# Patient Record
Sex: Female | Born: 2000 | Race: Black or African American | Hispanic: Yes | Marital: Single | Smoking: Never smoker
Health system: Southern US, Community
[De-identification: ages and names within clinical notes are randomized; demographics above are authoritative.]

## PROBLEM LIST (undated history)

## (undated) ENCOUNTER — Inpatient Hospital Stay (HOSPITAL_COMMUNITY): Payer: Self-pay

## (undated) ENCOUNTER — Emergency Department (HOSPITAL_COMMUNITY): Admission: EM | Payer: Medicaid Other | Source: Home / Self Care

## (undated) ENCOUNTER — Emergency Department (HOSPITAL_COMMUNITY)

## (undated) DIAGNOSIS — J45909 Unspecified asthma, uncomplicated: Secondary | ICD-10-CM

## (undated) DIAGNOSIS — N83209 Unspecified ovarian cyst, unspecified side: Secondary | ICD-10-CM

## (undated) DIAGNOSIS — A6000 Herpesviral infection of urogenital system, unspecified: Secondary | ICD-10-CM

## (undated) DIAGNOSIS — F419 Anxiety disorder, unspecified: Secondary | ICD-10-CM

## (undated) DIAGNOSIS — A749 Chlamydial infection, unspecified: Secondary | ICD-10-CM

## (undated) DIAGNOSIS — N730 Acute parametritis and pelvic cellulitis: Secondary | ICD-10-CM

## (undated) DIAGNOSIS — N390 Urinary tract infection, site not specified: Secondary | ICD-10-CM

## (undated) DIAGNOSIS — A549 Gonococcal infection, unspecified: Secondary | ICD-10-CM

## (undated) DIAGNOSIS — D649 Anemia, unspecified: Secondary | ICD-10-CM

## (undated) DIAGNOSIS — F32A Depression, unspecified: Secondary | ICD-10-CM

## (undated) HISTORY — PX: NO PAST SURGERIES: SHX2092

---
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
## 2018-04-25 ENCOUNTER — Encounter (HOSPITAL_COMMUNITY): Payer: Self-pay

## 2018-09-29 HISTORY — PX: OVARIAN CYST REMOVAL: SHX89

## 2018-11-04 ENCOUNTER — Encounter (HOSPITAL_COMMUNITY): Payer: Self-pay

## 2018-11-04 ENCOUNTER — Emergency Department (HOSPITAL_COMMUNITY)
Admission: EM | Admit: 2018-11-04 | Discharge: 2018-11-05 | Disposition: A | Discharge status: Home / Self Care | Payer: Medicaid Other | Attending: Emergency Medicine | Admitting: Emergency Medicine

## 2018-11-04 ENCOUNTER — Other Ambulatory Visit: Payer: Self-pay

## 2018-11-04 DIAGNOSIS — B349 Viral infection, unspecified: Secondary | ICD-10-CM

## 2018-11-04 DIAGNOSIS — Z87891 Personal history of nicotine dependence: Secondary | ICD-10-CM | POA: Insufficient documentation

## 2018-11-04 DIAGNOSIS — J45909 Unspecified asthma, uncomplicated: Secondary | ICD-10-CM | POA: Insufficient documentation

## 2018-11-04 LAB — COMPREHENSIVE METABOLIC PANEL
ALK PHOS: 53 U/L (ref 38–126)
ALT: 13 U/L (ref 0–44)
ANION GAP: 8 (ref 5–15)
AST: 21 U/L (ref 15–41)
Albumin: 4 g/dL (ref 3.5–5.0)
BILIRUBIN TOTAL: 0.7 mg/dL (ref 0.3–1.2)
BUN: 8 mg/dL (ref 6–20)
CALCIUM: 9.5 mg/dL (ref 8.9–10.3)
CO2: 21 mmol/L — ABNORMAL LOW (ref 22–32)
Chloride: 104 mmol/L (ref 98–111)
Creatinine, Ser: 0.82 mg/dL (ref 0.44–1.00)
GLUCOSE: 102 mg/dL — AB (ref 70–99)
Potassium: 4 mmol/L (ref 3.5–5.1)
Sodium: 133 mmol/L — ABNORMAL LOW (ref 135–145)
Total Protein: 8.3 g/dL — ABNORMAL HIGH (ref 6.5–8.1)

## 2018-11-04 LAB — CBC
HCT: 36.1 % (ref 36.0–46.0)
Hemoglobin: 10.1 g/dL — ABNORMAL LOW (ref 12.0–15.0)
MCH: 20 pg — AB (ref 26.0–34.0)
MCHC: 28 g/dL — ABNORMAL LOW (ref 30.0–36.0)
MCV: 71.6 fL — AB (ref 80.0–100.0)
NRBC: 0 % (ref 0.0–0.2)
PLATELETS: 179 10*3/uL (ref 150–400)
RBC: 5.04 MIL/uL (ref 3.87–5.11)
RDW: 16.8 % — ABNORMAL HIGH (ref 11.5–15.5)
WBC: 8 10*3/uL (ref 4.0–10.5)

## 2018-11-04 LAB — I-STAT BETA HCG BLOOD, ED (MC, WL, AP ONLY): I-stat hCG, quantitative: <5 m[IU]/mL (ref <5)

## 2018-11-04 LAB — LIPASE, BLOOD: Lipase: 46 U/L (ref 11–51)

## 2018-11-04 MED ORDER — ACETAMINOPHEN 325 MG PO TABS
650.0000 mg | ORAL_TABLET | Freq: Once | ORAL | Status: AC | PRN
  Administered 2018-11-04: 650 mg via ORAL
  Filled 2018-11-04: qty 2

## 2018-11-04 MED ORDER — SODIUM CHLORIDE 0.9% FLUSH
3.0000 mL | Freq: Once | INTRAVENOUS | Status: AC
  Administered 2018-11-05: 3 mL via INTRAVENOUS

## 2018-11-04 NOTE — ED Notes (Signed)
Bed: WA06 Expected date:  Expected time:  Means of arrival:  Comments: 

## 2018-11-04 NOTE — ED Triage Notes (Signed)
Pt reports a headache with photosensitivity that starting this morning. She is also reporting lower centralized abdominal pain x 2 days accompanied with diarrhea. Denies vomiting. EMS reports that pt had a panic attack with them, but pt is calm in triage. A&Ox4.

## 2018-11-05 LAB — URINALYSIS, ROUTINE W REFLEX MICROSCOPIC
BILIRUBIN URINE: NEGATIVE
Glucose, UA: NEGATIVE mg/dL
Ketones, ur: NEGATIVE mg/dL
NITRITE: NEGATIVE
PROTEIN: NEGATIVE mg/dL
Specific Gravity, Urine: 1.002 — ABNORMAL LOW (ref 1.005–1.030)
pH: 6 (ref 5.0–8.0)

## 2018-11-05 LAB — INFLUENZA PANEL BY PCR (TYPE A & B)
INFLAPCR: NEGATIVE
Influenza B By PCR: NEGATIVE

## 2018-11-05 MED ORDER — KETOROLAC TROMETHAMINE 15 MG/ML IJ SOLN
15.0000 mg | Freq: Once | INTRAMUSCULAR | Status: AC
  Administered 2018-11-05: 15 mg via INTRAVENOUS
  Filled 2018-11-05: qty 1

## 2018-11-05 MED ORDER — SODIUM CHLORIDE 0.9 % IV BOLUS
1000.0000 mL | Freq: Once | INTRAVENOUS | Status: AC
  Administered 2018-11-05: 1000 mL via INTRAVENOUS

## 2018-11-05 NOTE — ED Notes (Signed)
Patient is complaining of pain in mid upper abdomen. Patient states she feels nauseas when she stands up from the abdominal pain.

## 2018-11-05 NOTE — Discharge Instructions (Addendum)
You were evaluated in the Emergency Department and after careful evaluation, we did not find any emergent condition requiring admission or further testing in the hospital.  Your symptoms today seem to be due to a viral illness.  Please drink plenty of fluids at home and use Tylenol or ibuprofen for discomfort.  Please return to the Emergency Department if you experience any worsening of your condition, especially worsening abdominal pain.  We encourage you to follow up with a primary care provider.  Thank you for allowing [place] to be a part of your care.

## 2018-11-05 NOTE — ED Notes (Signed)
Patient aware of urine sample, will collect the next one.

## 2018-11-05 NOTE — ED Notes (Signed)
Ambulatory to the bathroom without assistance.

## 2018-11-05 NOTE — ED Provider Notes (Signed)
[facility] Emergency Department Provider Note MRN:  [id number]  Arrival date & time: 11/05/18     Chief Complaint   Headache and Abdominal Pain   History of Present Illness   Laura [name] is a 18 y.o. year-old female with a history of asthma presenting to the ED with chief complaint of abdominal pain.  2-1/2 days of feeling sick, fever, dull frontal headache, sore throat, general malaise, abdominal pain.  Pain is located in the periumbilical region, nonradiating, dull, constant.  Denies neck pain, no chest pain or shortness of breath, no vaginal bleeding or discharge.  No exacerbating relieving factors.  Patient's neighbor comes to her house a lot and was recently diagnosed with the flu.  Review of Systems  A complete 10 system review of systems was obtained and all systems are negative except as noted in the HPI and PMH.   Patient's Health History    Past Medical History:  Diagnosis Date  . Anemia   . Asthma   . Genital herpes     Past Surgical History:  Procedure Laterality Date  . NO PAST SURGERIES      History reviewed. No pertinent family history.  Social History   Socioeconomic History  . Marital status: Single    Spouse name: Not on file  . Number of children: Not on file  . Years of education: Not on file  . Highest education level: Not on file  Occupational History  . Not on file  Social Needs  . Financial resource strain: Not on file  . Food insecurity:    Worry: Not on file    Inability: Not on file  . Transportation needs:    Medical: Not on file    Non-medical: Not on file  Tobacco Use  . Smoking status: Former Research scientist (life sciences)  . Smokeless tobacco: Never Used  Substance and Sexual Activity  . Alcohol use: No    Frequency: Never  . Drug use: No  . Sexual activity: Not on file  Lifestyle  . Physical activity:    Days per week: Not on file    Minutes per session: Not on file  . Stress: Not on file  Relationships  . Social  connections:    Talks on phone: Not on file    Gets together: Not on file    Attends religious service: Not on file    Active member of club or organization: Not on file    Attends meetings of clubs or organizations: Not on file    Relationship status: Not on file  . Intimate partner violence:    Fear of current or ex partner: Not on file    Emotionally abused: Not on file    Physically abused: Not on file    Forced sexual activity: Not on file  Other Topics Concern  . Not on file  Social History Narrative  . Not on file     Physical Exam  Vital Signs and Nursing Notes reviewed Vitals:   11/05/18 0007 11/05/18 0109  BP: 111/74 115/73  Pulse: (!) 103 (!) 101  Resp: 17 (!) 21  Temp: 100.1 F (37.8 C)   SpO2: 100% 100%    CONSTITUTIONAL: Well-appearing, NAD NEURO:  Alert and oriented x 3, no focal deficits EYES:  eyes equal and reactive ENT/NECK:  no LAD, no JVD CARDIO: Regular rate, well-perfused, normal S1 and S2 PULM:  CTAB no wheezing or rhonchi GI/GU:  normal bowel sounds, non-distended, mild periumbilical tenderness to palpation  MSK/SPINE:  No gross deformities, no edema SKIN:  no rash, atraumatic PSYCH:  Appropriate speech and behavior  Diagnostic and Interventional Summary    EKG Interpretation  Date/Time:  Friday November 05 2018 00:14:10 EST Ventricular Rate:  102 PR Interval:    QRS Duration: 82 QT Interval:  329 QTC Calculation: 429 R Axis:   76 Text Interpretation:  Sinus tachycardia Confirmed by Gerlene Fee [phone]) on 11/05/2018 12:48:05 AM      Labs Reviewed  COMPREHENSIVE METABOLIC PANEL - Abnormal; Notable for the following components:      Result Value   Sodium 133 (*)    CO2 21 (*)    Glucose, Bld 102 (*)    Total Protein 8.3 (*)    All other components within normal limits  CBC - Abnormal; Notable for the following components:   Hemoglobin 10.1 (*)    MCV 71.6 (*)    MCH 20.0 (*)    MCHC 28.0 (*)    RDW 16.8 (*)    All other  components within normal limits  URINALYSIS, ROUTINE W REFLEX MICROSCOPIC - Abnormal; Notable for the following components:   Color, Urine STRAW (*)    Specific Gravity, Urine 1.002 (*)    Hgb urine dipstick SMALL (*)    Leukocytes, UA MODERATE (*)    Bacteria, UA RARE (*)    All other components within normal limits  LIPASE, BLOOD  INFLUENZA PANEL BY PCR (TYPE A & B)  I-STAT BETA HCG BLOOD, ED ([facility], [facility], [facility] ONLY)    No orders to display    Medications  acetaminophen (TYLENOL) tablet 650 mg (650 mg Oral Given 11/04/18 2219)  sodium chloride flush (NS) 0.9 % injection 3 mL (3 mLs Intravenous Given 11/05/18 0111)  sodium chloride 0.9 % bolus 1,000 mL (1,000 mLs Intravenous New Bag/Given 11/05/18 0107)  sodium chloride 0.9 % bolus 1,000 mL (1,000 mLs Intravenous New Bag/Given 11/05/18 0108)  ketorolac (TORADOL) 15 MG/ML injection 15 mg (15 mg Intravenous Given 11/05/18 0107)     Procedures Critical Care  ED Course and Medical Decision Making  I have reviewed the triage vital signs and the nursing notes.  Pertinent labs & imaging results that were available during my care of the patient were reviewed by me and considered in my medical decision making (see below for details).  Favoring influenza given the myriad of symptoms, less likely appendicitis, labs and flu swab pending.  Labs unremarkable, flu swab negative.  Upon reassessment tachycardia is resolved, patient's abdominal pain is resolved, abdominal exam is very reassuring, soft, no tenderness.  Favoring viral illness, given specific return precautions for worsening or return of abdominal pain but at this point very low concern for appendicitis.  After the discussed management above, the patient was determined to be safe for discharge.  The patient was in agreement with this plan and all questions regarding their care were answered.  ED return precautions were discussed and the patient will return to the ED with any significant worsening of  condition.  Barth Kirks. Sedonia Small, [place] [email or web address]  Final Clinical Impressions(s) / ED Diagnoses     ICD-10-CM   1. Viral illness B34.9     ED Discharge Orders    None         Maudie Flakes, MD 11/05/18 [phone]

## 2018-11-07 ENCOUNTER — Encounter (HOSPITAL_COMMUNITY): Payer: Self-pay | Admitting: *Deleted

## 2018-11-07 ENCOUNTER — Other Ambulatory Visit: Payer: Self-pay

## 2018-11-07 DIAGNOSIS — Z87891 Personal history of nicotine dependence: Secondary | ICD-10-CM | POA: Insufficient documentation

## 2018-11-07 DIAGNOSIS — R1013 Epigastric pain: Secondary | ICD-10-CM | POA: Insufficient documentation

## 2018-11-07 DIAGNOSIS — J45909 Unspecified asthma, uncomplicated: Secondary | ICD-10-CM | POA: Insufficient documentation

## 2018-11-07 DIAGNOSIS — D509 Iron deficiency anemia, unspecified: Secondary | ICD-10-CM | POA: Insufficient documentation

## 2018-11-07 DIAGNOSIS — Z79899 Other long term (current) drug therapy: Secondary | ICD-10-CM | POA: Insufficient documentation

## 2018-11-07 NOTE — ED Notes (Signed)
Bed: WLPT2 Expected date:  Expected time:  Means of arrival:  Comments: 

## 2018-11-07 NOTE — ED Triage Notes (Signed)
Pt was seen recent for similar symptoms with unclear etiology. Pt states that she is having constant abdominal pain, nausea. No diarrhea or emesis. Pt also feels dizzy all the time.

## 2018-11-08 ENCOUNTER — Emergency Department (HOSPITAL_COMMUNITY): Payer: Self-pay

## 2018-11-08 ENCOUNTER — Emergency Department (HOSPITAL_COMMUNITY)
Admission: EM | Admit: 2018-11-08 | Discharge: 2018-11-08 | Disposition: A | Discharge status: Home / Self Care | Payer: Self-pay | Attending: Emergency Medicine | Admitting: Emergency Medicine

## 2018-11-08 DIAGNOSIS — D509 Iron deficiency anemia, unspecified: Secondary | ICD-10-CM

## 2018-11-08 DIAGNOSIS — R1013 Epigastric pain: Secondary | ICD-10-CM

## 2018-11-08 LAB — COMPREHENSIVE METABOLIC PANEL
ALT: 14 U/L (ref 0–44)
ANION GAP: 9 (ref 5–15)
AST: 19 U/L (ref 15–41)
Albumin: 3.7 g/dL (ref 3.5–5.0)
Alkaline Phosphatase: 43 U/L (ref 38–126)
BUN: 11 mg/dL (ref 6–20)
CO2: 23 mmol/L (ref 22–32)
Calcium: 9.2 mg/dL (ref 8.9–10.3)
Chloride: 103 mmol/L (ref 98–111)
Creatinine, Ser: 0.72 mg/dL (ref 0.44–1.00)
GFR calc Af Amer: >60 mL/min (ref >60)
GFR calc non Af Amer: >60 mL/min (ref >60)
Glucose, Bld: 93 mg/dL (ref 70–99)
Potassium: 3.6 mmol/L (ref 3.5–5.1)
Sodium: 135 mmol/L (ref 135–145)
Total Bilirubin: 0.5 mg/dL (ref 0.3–1.2)
Total Protein: 8.2 g/dL — ABNORMAL HIGH (ref 6.5–8.1)

## 2018-11-08 LAB — CBC WITH DIFFERENTIAL/PLATELET
ABS IMMATURE GRANULOCYTES: 0.02 10*3/uL (ref 0.00–0.07)
BASOS ABS: 0 10*3/uL (ref 0.0–0.1)
BASOS PCT: 0 %
EOS ABS: 0 10*3/uL (ref 0.0–0.5)
Eosinophils Relative: 0 %
HCT: 34.3 % — ABNORMAL LOW (ref 36.0–46.0)
Hemoglobin: 9.8 g/dL — ABNORMAL LOW (ref 12.0–15.0)
IMMATURE GRANULOCYTES: 0 %
LYMPHS ABS: 2.5 10*3/uL (ref 0.7–4.0)
Lymphocytes Relative: 41 %
MCH: 20 pg — ABNORMAL LOW (ref 26.0–34.0)
MCHC: 28.6 g/dL — AB (ref 30.0–36.0)
MCV: 69.9 fL — ABNORMAL LOW (ref 80.0–100.0)
MONOS PCT: 13 %
Monocytes Absolute: 0.8 10*3/uL (ref 0.1–1.0)
NEUTROS ABS: 2.8 10*3/uL (ref 1.7–7.7)
NEUTROS PCT: 46 %
NRBC: 0 % (ref 0.0–0.2)
Platelets: 171 10*3/uL (ref 150–400)
RBC: 4.91 MIL/uL (ref 3.87–5.11)
RDW: 17.2 % — AB (ref 11.5–15.5)
WBC: 6.1 10*3/uL (ref 4.0–10.5)

## 2018-11-08 LAB — LIPASE, BLOOD: LIPASE: 56 U/L — AB (ref 11–51)

## 2018-11-08 LAB — POC URINE PREG, ED: Preg Test, Ur: NEGATIVE

## 2018-11-08 MED ORDER — LIDOCAINE VISCOUS HCL 2 % MT SOLN
15.0000 mL | Freq: Once | OROMUCOSAL | Status: AC
  Administered 2018-11-08: 15 mL via ORAL
  Filled 2018-11-08: qty 15

## 2018-11-08 MED ORDER — DIPHENHYDRAMINE HCL 50 MG/ML IJ SOLN
25.0000 mg | Freq: Once | INTRAMUSCULAR | Status: AC
  Administered 2018-11-08: 25 mg via INTRAVENOUS
  Filled 2018-11-08: qty 1

## 2018-11-08 MED ORDER — PANTOPRAZOLE SODIUM 40 MG PO TBEC
40.0000 mg | DELAYED_RELEASE_TABLET | Freq: Once | ORAL | Status: AC
  Administered 2018-11-08: 40 mg via ORAL
  Filled 2018-11-08: qty 1

## 2018-11-08 MED ORDER — METOCLOPRAMIDE HCL 5 MG/ML IJ SOLN
10.0000 mg | Freq: Once | INTRAMUSCULAR | Status: AC
  Administered 2018-11-08: 10 mg via INTRAVENOUS
  Filled 2018-11-08: qty 2

## 2018-11-08 MED ORDER — SODIUM CHLORIDE 0.9 % IV BOLUS
1000.0000 mL | Freq: Once | INTRAVENOUS | Status: AC
  Administered 2018-11-08: 1000 mL via INTRAVENOUS

## 2018-11-08 MED ORDER — ALUM & MAG HYDROXIDE-SIMETH 200-200-20 MG/5ML PO SUSP
30.0000 mL | Freq: Once | ORAL | Status: AC
  Administered 2018-11-08: 30 mL via ORAL
  Filled 2018-11-08: qty 30

## 2018-11-08 NOTE — ED Provider Notes (Signed)
[facility]-EMERGENCY DEPT Provider Note   CSN: [id number] Arrival date & time: 11/07/18  [id number]     History   Chief Complaint Chief Complaint  Patient presents with  . Abdominal Pain    HPI Laura [name] is a 18 y.o. female.  The history is provided by the patient.  She has history of anemia, asthma and comes in with ongoing epigastric pain and headache.  Symptoms started about 4 days ago, and she was seen in the ED 2 days ago at which time she was also having some difficulty breathing.  Her breathing is back to baseline, but she continues to have epigastric pain with some radiation to the back.  There has been associated nausea and vomiting.  Symptoms do seem to be worse after eating.  She denies fever or chills.  Headache is mainly occipital.  She has not been taking anything for her symptoms.  Of note, she is 9 months postpartum.  She is not breast-feeding.  Past Medical History:  Diagnosis Date  . Anemia   . Asthma   . Genital herpes     There are no active problems to display for this patient.   Past Surgical History:  Procedure Laterality Date  . NO PAST SURGERIES       OB History    Gravida  1   Para      Term      Preterm      AB      Living        SAB      TAB      Ectopic      Multiple      Live Births               Home Medications    Prior to Admission medications   Medication Sig Start Date End Date Taking? Authorizing Provider  medroxyPROGESTERone (DEPO-PROVERA) 150 MG/ML injection Inject 1 mL into the muscle every 3 (three) months. 07/27/18  Yes [provider]  valACYclovir (VALTREX) 500 MG tablet Take 500 mg by mouth 2 (two) times daily as needed (outbreak).  07/27/18  Yes [provider]    Family History History reviewed. No pertinent family history.  Social History Social History   Tobacco Use  . Smoking status: Former Smoker    Types: Cigarettes, Cigars    Last attempt to  quit: 10/07/2018    Years since quitting: 0.0  . Smokeless tobacco: Never Used  Substance Use Topics  . Alcohol use: No    Frequency: Never  . Drug use: No     Allergies   Patient has no known allergies.   Review of Systems Review of Systems  All other systems reviewed and are negative.    Physical Exam Updated Vital Signs BP (!) 109/59 (BP Location: Right Arm)   Pulse 80   Temp 98.6 F (37 C) (Oral)   Resp 16   Ht 5\' 3"  (1.6 m)   Wt 54.4 kg   LMP  (Within Weeks)   SpO2 100%   BMI 21.24 kg/m   Physical Exam Vitals signs and nursing note reviewed.    18 year old female, resting comfortably and in no acute distress. Vital signs are normal. Oxygen saturation is 100%, which is normal. Head is normocephalic and atraumatic. PERRLA, EOMI. Oropharynx is clear. Neck is nontender and supple without adenopathy or JVD. Back is nontender and there is no CVA tenderness. Lungs are clear without rales, wheezes,  or rhonchi. Chest is nontender. Heart has regular rate and rhythm without murmur. Abdomen is soft, flat, with mild epigastric and right upper quadrant tenderness with plus/minus Murphy sign.  There is no rebound or guarding.  There are no masses or hepatosplenomegaly and peristalsis is hypoactive. Extremities have no cyanosis or edema, full range of motion is present. Skin is warm and dry without rash. Neurologic: Mental status is normal, cranial nerves are intact, there are no motor or sensory deficits.  ED Treatments / Results  Labs (all labs ordered are listed, but only abnormal results are displayed) Labs Reviewed  CBC WITH DIFFERENTIAL/PLATELET - Abnormal; Notable for the following components:      Result Value   Hemoglobin 9.8 (*)    HCT 34.3 (*)    MCV 69.9 (*)    MCH 20.0 (*)    MCHC 28.6 (*)    RDW 17.2 (*)    All other components within normal limits  LIPASE, BLOOD - Abnormal; Notable for the following components:   Lipase 56 (*)    All other components  within normal limits  COMPREHENSIVE METABOLIC PANEL - Abnormal; Notable for the following components:   Total Protein 8.2 (*)    All other components within normal limits  POC URINE PREG, ED   Radiology US Abdomen Limited  Result Date: 11/08/2018 CLINICAL DATA:  Epigastric pain for 1 week EXAM: ULTRASOUND ABDOMEN LIMITED RIGHT UPPER QUADRANT COMPARISON:  None. FINDINGS: Gallbladder: No gallstones or wall thickening visualized. No sonographic Murphy sign noted by sonographer. Common bile duct: Diameter: 4 mm Liver: No focal lesion identified. Within normal limits in parenchymal echogenicity. Portal vein is patent on color Doppler imaging with normal direction of blood flow towards the liver. IMPRESSION: Negative. Electronically Signed   By: Marnee Spring M.D.   On: 11/08/2018 05:00    Procedures Procedures   Medications Ordered in ED Medications  sodium chloride 0.9 % bolus 1,000 mL (0 mLs Intravenous Stopped 11/08/18 0547)  metoCLOPramide (REGLAN) injection 10 mg (10 mg Intravenous Given 11/08/18 0352)  diphenhydrAMINE (BENADRYL) injection 25 mg (25 mg Intravenous Given 11/08/18 0358)  alum & mag hydroxide-simeth (MAALOX/MYLANTA) 200-200-20 MG/5ML suspension 30 mL (30 mLs Oral Given 11/08/18 0545)    And  lidocaine (XYLOCAINE) 2 % viscous mouth solution 15 mL (15 mLs Oral Given 11/08/18 0546)  pantoprazole (PROTONIX) EC tablet 40 mg (40 mg Oral Given 11/08/18 0544)     Initial Impression / Assessment and Plan / ED Course  I have reviewed the triage vital signs and the nursing notes.  Pertinent labs & imaging results that were available during my care of the patient were reviewed by me and considered in my medical decision making (see chart for details).  Abdominal pain which might be gastritis, but consider possible cholelithiasis.  Headache which appears to be benign.  She will be given a headache cocktail of normal saline, diphenhydramine, metoclopramide.  She will be sent for right  upper quadrant ultrasound to evaluate for possible cholelithiasis.  Old records are reviewed confirming recent ED visit with diagnosis of viral illness.  Ultrasound shows no evidence of cholelithiasis.  She is given a dose of Protonix plus a GI cocktail with excellent relief of symptoms.  She is advised to take over-the-counter omeprazole, use antacids as needed.  Return should symptoms worsen.  Final Clinical Impressions(s) / ED Diagnoses   Final diagnoses:  Epigastric pain  Microcytic anemia    ED Discharge Orders    None  Dione BoozeGlick, Surabhi Gadea, MD 11/08/18 [phone]

## 2018-11-08 NOTE — Discharge Instructions (Addendum)
Take omeprazole (Prilosec OTC) once a day.  Take antacids like Maalox, Mylanta, Pepto Bismol as needed.  Return if symptoms are getting worse.

## 2019-03-09 ENCOUNTER — Emergency Department (HOSPITAL_COMMUNITY)
Admission: EM | Admit: 2019-03-09 | Discharge: 2019-03-09 | Disposition: A | Discharge status: Home / Self Care | Payer: Medicaid Other | Attending: Emergency Medicine | Admitting: Emergency Medicine

## 2019-03-09 ENCOUNTER — Encounter (HOSPITAL_COMMUNITY): Payer: Self-pay | Admitting: *Deleted

## 2019-03-09 ENCOUNTER — Other Ambulatory Visit: Payer: Self-pay

## 2019-03-09 DIAGNOSIS — J45909 Unspecified asthma, uncomplicated: Secondary | ICD-10-CM | POA: Diagnosis not present

## 2019-03-09 DIAGNOSIS — R42 Dizziness and giddiness: Secondary | ICD-10-CM | POA: Diagnosis not present

## 2019-03-09 DIAGNOSIS — Z87891 Personal history of nicotine dependence: Secondary | ICD-10-CM | POA: Insufficient documentation

## 2019-03-09 LAB — I-STAT BETA HCG BLOOD, ED (MC, WL, AP ONLY): I-stat hCG, quantitative: <5 m[IU]/mL (ref <5)

## 2019-03-09 NOTE — ED Triage Notes (Signed)
Pt sts yesterday around 3 pm while at work she felt weak, dizzy and shaky, she tried to make an appt with dr but was told to go to ER. Pt sts she works in [employer] that has no ac or fans, and is not allowed to have water to drink while at work. Pt denies any symptoms right now.

## 2019-03-09 NOTE — ED Notes (Signed)
Bed: WTR5 Expected date:  Expected time:  Means of arrival:  Comments: 

## 2019-03-09 NOTE — ED Notes (Signed)
Pt was provided with a cup of water and was able to drink it all with no complaints.

## 2019-03-09 NOTE — ED Provider Notes (Signed)
[facility]-EMERGENCY DEPT Provider Note   CSN: [id number] Arrival date & time: 03/09/19  0343    History   Chief Complaint Chief Complaint  Patient presents with  . Dizziness    HPI Laura [name] is a 18 y.o. female.     The history is provided by the patient.  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 Feels better now.  No CP, no SOB, no n/v/d.  No f/c/r.    Past Medical History:  Diagnosis Date  . Anemia   . Asthma   . Genital herpes     There are no active problems to display for this patient.   Past Surgical History:  Procedure Laterality Date  . NO PAST SURGERIES       OB History    Gravida  1   Para      Term      Preterm      AB      Living        SAB      TAB      Ectopic      Multiple      Live Births               Home Medications    Prior to Admission medications   Medication Sig Start Date End Date Taking? Authorizing Provider  albuterol (VENTOLIN HFA) 108 (90 Base) MCG/ACT inhaler Inhale 1-2 puffs into the lungs every 6 (six) hours as needed for wheezing or shortness of breath.   Yes [provider]  valACYclovir (VALTREX) 500 MG tablet Take 500 mg by mouth 2 (two) times daily as needed (outbreak).  07/27/18  Yes [provider]    Family History No family history on file.  Social History Social History   Tobacco  Use  . Smoking status: Former Smoker    Types: Cigarettes, Cigars    Last attempt to quit: 10/07/2018    Years since quitting: 0.4  . Smokeless tobacco: Never Used  Substance Use Topics  . Alcohol use: No    Frequency: Never  . Drug use: No     Allergies   Patient has no known allergies.   Review of Systems Review of Systems  Constitutional: Negative for fever.  HENT: Negative for hearing loss and tinnitus.   Eyes: Negative for visual disturbance.  Respiratory: Negative for cough and shortness of breath.   Cardiovascular: Negative for chest pain, palpitations, leg swelling and syncope.  Gastrointestinal: Negative for blood in stool, diarrhea, nausea and vomiting.  Neurological: Positive for light-headedness. Negative for weakness and headaches.  All other systems reviewed and are negative.    Physical Exam Updated Vital Signs BP 110/73   Pulse 87   Temp 98.3 F (36.8 C) (Oral)   Resp 16   LMP  (LMP Unknown)   SpO2 100%   Physical Exam Vitals signs and nursing note reviewed.  Constitutional:  General: She is not in acute distress.    Appearance: She is normal weight.  HENT:     Head: Normocephalic and atraumatic.     Nose: Nose normal.  Eyes:     Conjunctiva/sclera: Conjunctivae normal.     Pupils: Pupils are equal, round, and reactive to light.  Neck:     Musculoskeletal: Normal range of motion and neck supple.  Cardiovascular:     Rate and Rhythm: Normal rate and regular rhythm.     Pulses: Normal pulses.     Heart sounds: Normal heart sounds.  Pulmonary:     Effort: Pulmonary effort is normal.     Breath sounds: Normal breath sounds.  Abdominal:     General: Abdomen is flat. Bowel sounds are normal.     Tenderness: There is no abdominal tenderness. There is no guarding or rebound.  Musculoskeletal: Normal range of motion.  Skin:    General: Skin is warm and dry.     Capillary Refill: Capillary refill takes less than 2 seconds.  Neurological:      General: No focal deficit present.     Mental Status: She is alert and oriented to person, place, and time.  Psychiatric:        Mood and Affect: Mood normal.        Behavior: Behavior normal.     Orthostatic VS for the past 24 hrs:  BP- Lying Pulse- Lying BP- Sitting Pulse- Sitting BP- Standing at 0 minutes Pulse- Standing at 0 minutes  03/09/19 0434 110/73 87 111/77 89 128/83 90    ED Treatments / Results  Labs (all labs ordered are listed, but only abnormal results are displayed) Results for orders placed or performed during the hospital encounter of 03/09/19  I-Stat Beta hCG blood, ED ([facility], [facility], [facility] only)  Result Value Ref Range   I-stat hCG, quantitative <5.0 <5 mIU/mL   Comment 3           No results found.  Radiology No results found.  Procedures Procedures (including critical care time)  Medications Ordered in ED Medications - No data to display   Patient is not clinically dehydrated at this time.  PO challenged successfully in the department, will write note for patient to drink fluids on shift.   Final Clinical Impressions(s) / ED Diagnoses   Return for intractable cough, coughing up blood,fevers >100.4 unrelieved by medication, shortness of breath, intractable vomiting, chest pain, shortness of breath, weakness,numbness, changes in speech, facial asymmetry,abdominal pain, passing out,Inability to tolerate liquids or food, cough, altered mental status or any concerns. No signs of systemic illness or infection. The patient is nontoxic-appearing on exam and vital signs are within normal limits.   I have reviewed the triage vital signs and the nursing notes. Pertinent labs &imaging results that were available during my care of the patient were reviewed by me and considered in my medical decision making (see chart for details).  After history, exam, and medical workup I feel the patient has been appropriately medically screened and is safe for discharge home.  Pertinent diagnoses were discussed with the patient. Patient was given return precautions   Oswell Say, MD 03/09/19 6578

## 2019-06-13 ENCOUNTER — Other Ambulatory Visit: Payer: Self-pay

## 2019-06-13 ENCOUNTER — Encounter (HOSPITAL_COMMUNITY): Payer: Self-pay | Admitting: Emergency Medicine

## 2019-06-13 ENCOUNTER — Emergency Department (HOSPITAL_COMMUNITY): Payer: Medicaid Other

## 2019-06-13 ENCOUNTER — Emergency Department (HOSPITAL_COMMUNITY)
Admission: EM | Admit: 2019-06-13 | Discharge: 2019-06-13 | Disposition: A | Discharge status: Home / Self Care | Payer: Medicaid Other | Attending: Emergency Medicine | Admitting: Emergency Medicine

## 2019-06-13 DIAGNOSIS — Z87891 Personal history of nicotine dependence: Secondary | ICD-10-CM | POA: Diagnosis not present

## 2019-06-13 DIAGNOSIS — N83201 Unspecified ovarian cyst, right side: Secondary | ICD-10-CM | POA: Insufficient documentation

## 2019-06-13 DIAGNOSIS — R1013 Epigastric pain: Secondary | ICD-10-CM

## 2019-06-13 DIAGNOSIS — J45909 Unspecified asthma, uncomplicated: Secondary | ICD-10-CM | POA: Insufficient documentation

## 2019-06-13 LAB — URINALYSIS, ROUTINE W REFLEX MICROSCOPIC
Bilirubin Urine: NEGATIVE
Glucose, UA: NEGATIVE mg/dL
Hgb urine dipstick: NEGATIVE
Ketones, ur: NEGATIVE mg/dL
Nitrite: NEGATIVE
Protein, ur: NEGATIVE mg/dL
Specific Gravity, Urine: 1.024 (ref 1.005–1.030)
pH: 6 (ref 5.0–8.0)

## 2019-06-13 LAB — CBC
HCT: 33.1 % — ABNORMAL LOW (ref 36.0–46.0)
Hemoglobin: 9.6 g/dL — ABNORMAL LOW (ref 12.0–15.0)
MCH: 21.2 pg — ABNORMAL LOW (ref 26.0–34.0)
MCHC: 29 g/dL — ABNORMAL LOW (ref 30.0–36.0)
MCV: 73.1 fL — ABNORMAL LOW (ref 80.0–100.0)
Platelets: 162 10*3/uL (ref 150–400)
RBC: 4.53 MIL/uL (ref 3.87–5.11)
RDW: 15.9 % — ABNORMAL HIGH (ref 11.5–15.5)
WBC: 4.3 10*3/uL (ref 4.0–10.5)
nRBC: 0 % (ref 0.0–0.2)

## 2019-06-13 LAB — COMPREHENSIVE METABOLIC PANEL
ALT: 11 U/L (ref 0–44)
AST: 19 U/L (ref 15–41)
Albumin: 3.9 g/dL (ref 3.5–5.0)
Alkaline Phosphatase: 49 U/L (ref 38–126)
Anion gap: 9 (ref 5–15)
BUN: 10 mg/dL (ref 6–20)
CO2: 24 mmol/L (ref 22–32)
Calcium: 9.2 mg/dL (ref 8.9–10.3)
Chloride: 104 mmol/L (ref 98–111)
Creatinine, Ser: 0.69 mg/dL (ref 0.44–1.00)
GFR calc Af Amer: >60 mL/min (ref >60)
GFR calc non Af Amer: >60 mL/min (ref >60)
Glucose, Bld: 81 mg/dL (ref 70–99)
Potassium: 3.8 mmol/L (ref 3.5–5.1)
Sodium: 137 mmol/L (ref 135–145)
Total Bilirubin: 0.3 mg/dL (ref 0.3–1.2)
Total Protein: 7.2 g/dL (ref 6.5–8.1)

## 2019-06-13 LAB — LIPASE, BLOOD: Lipase: 42 U/L (ref 11–51)

## 2019-06-13 LAB — I-STAT BETA HCG BLOOD, ED (MC, WL, AP ONLY): I-stat hCG, quantitative: <5 m[IU]/mL (ref <5)

## 2019-06-13 MED ORDER — SODIUM CHLORIDE (PF) 0.9 % IJ SOLN
INTRAMUSCULAR | Status: AC
  Filled 2019-06-13: qty 50

## 2019-06-13 MED ORDER — SODIUM CHLORIDE 0.9% FLUSH: 3.0000 mL | Freq: Once | INTRAVENOUS | Status: DC

## 2019-06-13 MED ORDER — IOHEXOL 300 MG/ML  SOLN
100.0000 mL | Freq: Once | INTRAMUSCULAR | Status: AC | PRN
  Administered 2019-06-13: 100 mL via INTRAVENOUS

## 2019-06-13 NOTE — ED Triage Notes (Signed)
Per pt, states she has been having generalized abdominal pain for 2 days-no N/V/D-

## 2019-06-13 NOTE — ED Provider Notes (Signed)
[facility]-EMERGENCY DEPT Provider Note   CSN: [id number] Arrival date & time: 06/13/19  1037     History   Chief Complaint Chief Complaint  Patient presents with  . Abdominal Pain    HPI Laura [name] is a 18 y.o. female.  Presents emergency department with chief complaint of abdominal pain.  Symptoms been going on for the past 2 days, no obvious alleviating or aggravating factors, states pain seems to come and go at random, not associated with eating.  Pain primarily in the middle epigastric of her abdomen, also in left upper, denies pain in her lower abdomen or pelvic region.  No associated nausea or vomiting.  Has not taken any medication for this, does not want any pain medicine at this time.  Pain is currently mild but can be severe at times.  No associated dysuria, hematuria.    HPI  Past Medical History:  Diagnosis Date  . Anemia   . Asthma   . Genital herpes     There are no active problems to display for this patient.   Past Surgical History:  Procedure Laterality Date  . NO PAST SURGERIES       OB History    Gravida  1   Para      Term      Preterm      AB      Living        SAB      TAB      Ectopic      Multiple      Live Births               Home Medications    Prior to Admission medications   Medication Sig Start Date End Date Taking? Authorizing Provider  albuterol (VENTOLIN HFA) 108 (90 Base) MCG/ACT inhaler Inhale 1-2 puffs into the lungs every 6 (six) hours as needed for wheezing or shortness of breath.   Yes [provider]  valACYclovir (VALTREX) 500 MG tablet Take 500 mg by mouth 2 (two) times daily as needed (outbreak).  07/27/18  Yes [provider]    Family History No family history on file.  Social History Social History   Tobacco Use  . Smoking status: Former Smoker    Types: Cigarettes, Cigars    Quit date: 10/07/2018    Years since quitting: 0.6  . Smokeless  tobacco: Never Used  Substance Use Topics  . Alcohol use: No    Frequency: Never  . Drug use: No     Allergies   Patient has no known allergies.   Review of Systems Review of Systems  Constitutional: Negative for chills and fever.  HENT: Negative for ear pain and sore throat.   Eyes: Negative for pain and visual disturbance.  Respiratory: Negative for cough and shortness of breath.   Cardiovascular: Negative for chest pain and palpitations.  Gastrointestinal: Positive for abdominal pain. Negative for vomiting.  Genitourinary: Negative for dysuria and hematuria.  Musculoskeletal: Negative for arthralgias and back pain.  Skin: Negative for color change and rash.  Neurological: Negative for seizures and syncope.  All other systems reviewed and are negative.    Physical Exam Updated Vital Signs BP 107/64 (BP Location: Left Arm)   Pulse 82   Temp 98.9 F (37.2 C) (Oral)   Ht 5\' 3"  (1.6 m)   Wt 63.7 kg   SpO2 100%   BMI 24.89 kg/m   Physical Exam Vitals signs and  nursing note reviewed.  Constitutional:      General: She is not in acute distress.    Appearance: She is well-developed.  HENT:     Head: Normocephalic and atraumatic.  Eyes:     Conjunctiva/sclera: Conjunctivae normal.  Neck:     Musculoskeletal: Neck supple.  Cardiovascular:     Rate and Rhythm: Normal rate and regular rhythm.     Heart sounds: No murmur.  Pulmonary:     Effort: Pulmonary effort is normal. No respiratory distress.     Breath sounds: Normal breath sounds.  Abdominal:     Palpations: Abdomen is soft.     Comments: Periumbilical tenderness palpation, right lower quadrant tenderness palpation  Skin:    General: Skin is warm and dry.  Neurological:     Mental Status: She is alert.      ED Treatments / Results  Labs (all labs ordered are listed, but only abnormal results are displayed) Labs Reviewed  CBC - Abnormal; Notable for the following components:      Result Value    Hemoglobin 9.6 (*)    HCT 33.1 (*)    MCV 73.1 (*)    MCH 21.2 (*)    MCHC 29.0 (*)    RDW 15.9 (*)    All other components within normal limits  LIPASE, BLOOD  COMPREHENSIVE METABOLIC PANEL  URINALYSIS, ROUTINE W REFLEX MICROSCOPIC  I-STAT BETA HCG BLOOD, ED ([facility], [facility], [facility] ONLY)    EKG None  Radiology No results found.  Procedures Procedures (including critical care time)  Medications Ordered in ED Medications  sodium chloride flush (NS) 0.9 % injection 3 mL (has no administration in time range)     Initial Impression / Assessment and Plan / ED Course  I have reviewed the triage vital signs and the nursing notes.  Pertinent labs & imaging results that were available during my care of the patient were reviewed by me and considered in my medical decision making (see chart for details).  Clinical Course as of Jun 13 1719  Mon Jun 13, 2019  1703 Updated on results, discussed need for close PCP recheck, OB follow-up and an ultrasound; patient remains well-appearing with soft abdomen   [RD]    Clinical Course User Index [RD] Milagros Lollykstra, Teal Raben S, MD      18 year old lady who presents the ER with abdominal pain.  By history, primarily epigastric and left upper quadrant.  At time of my interview patient was not having any ongoing symptoms.  On exam noted some right lower quadrant tenderness to palpation and ordered CT scan to further evaluate.  Labs were within normal limits.  CT scan is negative for acute abdominal pelvic abnormality however did demonstrate large right ovarian cyst.  Patient not having any pelvic pain, repeat exam no longer having tenderness there, believe she is appropriate for further management as outpatient.  Recommended follow-up with her OB/GYN, and obtain ultrasound as outpatient per radiology recommendations.  Originally had ordered urinalysis however patient did not provide sample and states she does not want a wait for this to be completed.  Given has no  specific urinary symptoms, no lower abdominal pain, will defer this testing.  Reviewed return precautions with patient, will discharge home.      After the discussed management above, the patient was determined to be safe for discharge.  The patient was in agreement with this plan and all questions regarding their care were answered.  ED return precautions were discussed and  the patient will return to the ED with any significant worsening of condition.   Final Clinical Impressions(s) / ED Diagnoses   Final diagnoses:  Epigastric abdominal pain  Cyst of right ovary    ED Discharge Orders    None       Lucrezia Starch, MD 06/13/19 1723

## 2019-06-13 NOTE — Discharge Instructions (Signed)
I recommend following up both with your primary doctor as well as your OB/GYN.  Per the radiologist recommendations, you will need to have an ultrasound of your cyst as an outpatient to further evaluate, should be done within the next few weeks.  If you develop worsening pain, vomiting, fever, please return to ER for reassessment.

## 2019-06-13 NOTE — ED Notes (Signed)
Pt made aware urine is needed 

## 2019-06-13 NOTE — ED Notes (Signed)
Pt is alert and orinted x  4 and is verbally responsive. Pt denies pain at this time.

## 2019-06-19 ENCOUNTER — Telehealth: Payer: Medicaid Other

## 2019-08-15 DIAGNOSIS — J45909 Unspecified asthma, uncomplicated: Secondary | ICD-10-CM | POA: Insufficient documentation

## 2019-08-30 ENCOUNTER — Emergency Department (HOSPITAL_COMMUNITY)
Admission: EM | Admit: 2019-08-30 | Discharge: 2019-08-30 | Disposition: A | Discharge status: Home / Self Care | Payer: Medicaid Other | Attending: Emergency Medicine | Admitting: Emergency Medicine

## 2019-08-30 ENCOUNTER — Other Ambulatory Visit: Payer: Self-pay

## 2019-08-30 DIAGNOSIS — O99519 Diseases of the respiratory system complicating pregnancy, unspecified trimester: Secondary | ICD-10-CM | POA: Insufficient documentation

## 2019-08-30 DIAGNOSIS — Z79899 Other long term (current) drug therapy: Secondary | ICD-10-CM | POA: Diagnosis not present

## 2019-08-30 DIAGNOSIS — Z87891 Personal history of nicotine dependence: Secondary | ICD-10-CM | POA: Insufficient documentation

## 2019-08-30 DIAGNOSIS — Z3A00 Weeks of gestation of pregnancy not specified: Secondary | ICD-10-CM | POA: Insufficient documentation

## 2019-08-30 DIAGNOSIS — R112 Nausea with vomiting, unspecified: Secondary | ICD-10-CM

## 2019-08-30 DIAGNOSIS — O2340 Unspecified infection of urinary tract in pregnancy, unspecified trimester: Secondary | ICD-10-CM

## 2019-08-30 DIAGNOSIS — O2330 Infections of other parts of urinary tract in pregnancy, unspecified trimester: Secondary | ICD-10-CM | POA: Diagnosis not present

## 2019-08-30 DIAGNOSIS — J45909 Unspecified asthma, uncomplicated: Secondary | ICD-10-CM | POA: Insufficient documentation

## 2019-08-30 DIAGNOSIS — O99891 Other specified diseases and conditions complicating pregnancy: Secondary | ICD-10-CM | POA: Insufficient documentation

## 2019-08-30 LAB — COMPREHENSIVE METABOLIC PANEL
ALT: 10 U/L (ref 0–44)
AST: 17 U/L (ref 15–41)
Albumin: 3.7 g/dL (ref 3.5–5.0)
Alkaline Phosphatase: 41 U/L (ref 38–126)
Anion gap: 10 (ref 5–15)
BUN: 5 mg/dL — ABNORMAL LOW (ref 6–20)
CO2: 22 mmol/L (ref 22–32)
Calcium: 9.2 mg/dL (ref 8.9–10.3)
Chloride: 103 mmol/L (ref 98–111)
Creatinine, Ser: 0.66 mg/dL (ref 0.44–1.00)
GFR calc Af Amer: >60 mL/min (ref >60)
GFR calc non Af Amer: >60 mL/min (ref >60)
Glucose, Bld: 85 mg/dL (ref 70–99)
Potassium: 3.6 mmol/L (ref 3.5–5.1)
Sodium: 135 mmol/L (ref 135–145)
Total Bilirubin: 0.3 mg/dL (ref 0.3–1.2)
Total Protein: 7.2 g/dL (ref 6.5–8.1)

## 2019-08-30 LAB — CBC
HCT: 35.1 % — ABNORMAL LOW (ref 36.0–46.0)
Hemoglobin: 10.5 g/dL — ABNORMAL LOW (ref 12.0–15.0)
MCH: 21.2 pg — ABNORMAL LOW (ref 26.0–34.0)
MCHC: 29.9 g/dL — ABNORMAL LOW (ref 30.0–36.0)
MCV: 70.9 fL — ABNORMAL LOW (ref 80.0–100.0)
Platelets: 169 10*3/uL (ref 150–400)
RBC: 4.95 MIL/uL (ref 3.87–5.11)
RDW: 15.4 % (ref 11.5–15.5)
WBC: 3.8 10*3/uL — ABNORMAL LOW (ref 4.0–10.5)
nRBC: 0 % (ref 0.0–0.2)

## 2019-08-30 LAB — URINALYSIS, ROUTINE W REFLEX MICROSCOPIC
Bilirubin Urine: NEGATIVE
Glucose, UA: NEGATIVE mg/dL
Hgb urine dipstick: NEGATIVE
Ketones, ur: NEGATIVE mg/dL
Nitrite: POSITIVE — AB
Protein, ur: NEGATIVE mg/dL
Specific Gravity, Urine: 1.016 (ref 1.005–1.030)
pH: 6 (ref 5.0–8.0)

## 2019-08-30 LAB — I-STAT BETA HCG BLOOD, ED (MC, WL, AP ONLY): I-stat hCG, quantitative: >2000 m[IU]/mL — ABNORMAL HIGH (ref <5)

## 2019-08-30 LAB — HCG, QUANTITATIVE, PREGNANCY: hCG, Beta Chain, Quant, S: 61253 m[IU]/mL — ABNORMAL HIGH (ref <5)

## 2019-08-30 LAB — LIPASE, BLOOD: Lipase: 39 U/L (ref 11–51)

## 2019-08-30 MED ORDER — METOCLOPRAMIDE HCL 10 MG PO TABS: 5.0000 mg | ORAL_TABLET | Freq: Four times a day (QID) | ORAL | 0 refills | Status: DC

## 2019-08-30 MED ORDER — SODIUM CHLORIDE 0.9 % IV SOLN
1.0000 g | Freq: Once | INTRAVENOUS | Status: AC
  Administered 2019-08-30: 1 g via INTRAVENOUS
  Filled 2019-08-30: qty 10

## 2019-08-30 MED ORDER — SODIUM CHLORIDE 0.9 % IV BOLUS
1000.0000 mL | Freq: Once | INTRAVENOUS | Status: AC
  Administered 2019-08-30: 1000 mL via INTRAVENOUS

## 2019-08-30 MED ORDER — METOCLOPRAMIDE HCL 5 MG/ML IJ SOLN
5.0000 mg | Freq: Once | INTRAMUSCULAR | Status: AC
  Administered 2019-08-30: 5 mg via INTRAVENOUS
  Filled 2019-08-30: qty 2

## 2019-08-30 MED ORDER — CEPHALEXIN 500 MG PO CAPS: 500.0000 mg | ORAL_CAPSULE | Freq: Two times a day (BID) | ORAL | 0 refills | Status: DC

## 2019-08-30 MED ORDER — ONDANSETRON HCL 4 MG/2ML IJ SOLN
4.0000 mg | Freq: Once | INTRAMUSCULAR | Status: AC
  Administered 2019-08-30: 4 mg via INTRAVENOUS
  Filled 2019-08-30: qty 2

## 2019-08-30 NOTE — ED Provider Notes (Signed)
I received pt in signout from Dr. Alvino Chapel. PT had p/w nausea, found to be pregnant. Also w/ UTI. Given CTX and reglan.  At time of signout, awaiting p.o. challenge.  Patient continued to have nausea with sips of water, therefore a dose of Zofran.  On reassessment, she felt much better and has been able to tolerate applesauce and crackers.  Discussed supportive measures for nausea/morning sickness, emphasized importance of follow-up with OB/GYN, and recommended daily prenatal vitamin.  Extensively reviewed return precautions including abdominal pain, intractable vomiting, fever, or vaginal bleeding.  She voiced understanding.   Auri Jahnke, Wenda Overland, MD 08/30/19 1723

## 2019-08-30 NOTE — ED Provider Notes (Signed)
[facility]   CSN: [id number] Arrival date & time: 08/30/19  1137     History   Chief Complaint Chief Complaint  Patient presents with  . Abdominal Pain  . Nausea    HPI Laura [name] is a 18 y.o. female.     HPI Patient presents with nausea vomiting and upper abdominal pain.  Around 4 or 5 days ago had had some diarrhea and cough also.  Negative outpatient Covid test.  States now the diarrhea is cleared up and she has nausea and some dry heaving but not actual vomiting.  Decreased oral intake.  States nausea is worse when she is walking around.  Pain is dull in her upper abdomen.  Does not get menses regularly due to previous Depo shot.  States she had the Depo shot multiple but cannot tell me when.  No fevers or chills.  No direct sick contacts. Past Medical History:  Diagnosis Date  . Anemia   . Asthma   . Genital herpes     There are no active problems to display for this patient.   Past Surgical History:  Procedure Laterality Date  . NO PAST SURGERIES       OB History    Gravida  1   Para      Term      Preterm      AB      Living        SAB      TAB      Ectopic      Multiple      Live Births               Home Medications    Prior to Admission medications   Medication Sig Start Date End Date Taking? Authorizing Provider  albuterol (VENTOLIN HFA) 108 (90 Base) MCG/ACT inhaler Inhale 1-2 puffs into the lungs every 6 (six) hours as needed for wheezing or shortness of breath.    [provider]  valACYclovir (VALTREX) 500 MG tablet Take 500 mg by mouth 2 (two) times daily as needed (outbreak).  07/27/18   [provider]    Family History No family history on file.  Social History Social History   Tobacco Use  . Smoking status: Former Smoker    Types: Cigarettes, Cigars    Quit date: 10/07/2018    Years since quitting: 0.8  . Smokeless tobacco: Never Used   Substance Use Topics  . Alcohol use: No    Frequency: Never  . Drug use: No     Allergies   Patient has no known allergies.   Review of Systems Review of Systems  Constitutional: Negative for appetite change.  HENT: Negative for congestion.   Respiratory: Negative for shortness of breath.   Cardiovascular: Negative for chest pain.  Gastrointestinal: Positive for abdominal pain and nausea.  Genitourinary: Negative for dysuria.  Musculoskeletal: Negative for back pain.  Skin: Negative for rash.  Neurological: Negative for weakness and numbness.  Psychiatric/Behavioral: Negative for confusion.     Physical Exam Updated Vital Signs BP 112/77 (BP Location: Right Arm)   Pulse 78   Temp 98.6 F (37 C)   Resp 15   SpO2 99%   Physical Exam Vitals signs reviewed.  HENT:     Head: Normocephalic and atraumatic.  Eyes:     Pupils: Pupils are equal, round, and reactive to light.  Cardiovascular:     Rate and Rhythm:  Normal rate and regular rhythm.  Pulmonary:     Effort: Pulmonary effort is normal.  Abdominal:     Hernia: No hernia is present.     Comments: Minimal epigastric tenderness without rebound or guarding.  No lower abdominal tenderness.  Skin:    General: Skin is warm.     Capillary Refill: Capillary refill takes less than 2 seconds.  Neurological:     Mental Status: She is alert and oriented to person, place, and time.      ED Treatments / Results  Labs (all labs ordered are listed, but only abnormal results are displayed) Labs Reviewed  COMPREHENSIVE METABOLIC PANEL - Abnormal; Notable for the following components:      Result Value   BUN 5 (*)    All other components within normal limits  CBC - Abnormal; Notable for the following components:   WBC 3.8 (*)    Hemoglobin 10.5 (*)    HCT 35.1 (*)    MCV 70.9 (*)    MCH 21.2 (*)    MCHC 29.9 (*)    All other components within normal limits  URINALYSIS, ROUTINE W REFLEX MICROSCOPIC - Abnormal;  Notable for the following components:   APPearance CLOUDY (*)    Nitrite POSITIVE (*)    Leukocytes,Ua LARGE (*)    Bacteria, UA MANY (*)    All other components within normal limits  HCG, QUANTITATIVE, PREGNANCY - Abnormal; Notable for the following components:   hCG, Beta Chain, Quant, S 61,253 (*)    All other components within normal limits  I-STAT BETA HCG BLOOD, ED ([facility], [facility], [facility] ONLY) - Abnormal; Notable for the following components:   I-stat hCG, quantitative >2,000.0 (*)    All other components within normal limits  URINE CULTURE  LIPASE, BLOOD    EKG None  Radiology No results found.  Procedures Procedures (including critical care time)  Medications Ordered in ED Medications  cefTRIAXone (ROCEPHIN) 1 g in sodium chloride 0.9 % 100 mL IVPB (1 g Intravenous New Bag/Given 08/30/19 1414)  metoCLOPramide (REGLAN) injection 5 mg (5 mg Intravenous Given 08/30/19 1249)  sodium chloride 0.9 % bolus 1,000 mL (1,000 mLs Intravenous New Bag/Given 08/30/19 1414)     Initial Impression / Assessment and Plan / ED Course  I have reviewed the triage vital signs and the nursing notes.  Pertinent labs & imaging results that were available during my care of the patient were reviewed by me and considered in my medical decision making (see chart for details).        Patient presents with nausea vomiting upper abdominal pain.  Had recent negative Covid test.  However does have positive pregnancy test here.  Quantitative hCG G was 62,000.  Unsure last menses.  Patient does have an apparent urinary tract infection.  However does not have severely abnormal white count.  No fever.  No CVA tenderness.  The nausea vomiting easily could be from the pregnancy and not a urinary tract infection.  Pyelonephritis felt less likely.  Will try oral challenge here and if tolerates likely be able to discharge home with antibiotics.  Patient states she talked to Dr. Cyndie Chime office and they wanted more  information.  I called the office and inform them of the quant hCG level and they will see her in follow-up.  Do not need further work-up..  Final Clinical Impressions(s) / ED Diagnoses   Final diagnoses:  Nausea and vomiting, intractability of vomiting not specified, unspecified vomiting type  Urinary tract infection in mother during pregnancy, antepartum    ED Discharge Orders    None       Benjiman Core, MD 08/30/19 1441

## 2019-08-30 NOTE — ED Triage Notes (Signed)
Pt here for 4 days of generalized abdominal pain and nausea. Pt sts she had diarrhea the day prior to onset of abdominal pain. Sts she has been gagging but not vomiting. Nausea worse when moving around but pain more pronounced when lying down. Negative covid test.

## 2019-08-30 NOTE — ED Notes (Signed)
Pt reports feeling nauseas with sips of water. Reglan given earlier did not help

## 2019-08-30 NOTE — ED Notes (Signed)
Pt Reports zofran helped. able to keep down water and ginger ale. Gave pt graham crackers and apple sauce on request.

## 2019-08-31 LAB — URINE CULTURE: Culture: <10000 — AB

## 2019-09-19 ENCOUNTER — Encounter: Payer: Self-pay | Admitting: *Deleted

## 2019-11-26 ENCOUNTER — Other Ambulatory Visit: Payer: Self-pay

## 2019-11-26 ENCOUNTER — Encounter (HOSPITAL_COMMUNITY): Payer: Self-pay | Admitting: Emergency Medicine

## 2019-11-26 ENCOUNTER — Emergency Department (HOSPITAL_COMMUNITY)
Admission: EM | Admit: 2019-11-26 | Discharge: 2019-11-26 | Disposition: A | Discharge status: Home / Self Care | Payer: Medicaid Other | Attending: Emergency Medicine | Admitting: Emergency Medicine

## 2019-11-26 DIAGNOSIS — R109 Unspecified abdominal pain: Secondary | ICD-10-CM | POA: Diagnosis not present

## 2019-11-26 DIAGNOSIS — O219 Vomiting of pregnancy, unspecified: Secondary | ICD-10-CM | POA: Insufficient documentation

## 2019-11-26 DIAGNOSIS — Z87891 Personal history of nicotine dependence: Secondary | ICD-10-CM | POA: Diagnosis not present

## 2019-11-26 DIAGNOSIS — R8290 Unspecified abnormal findings in urine: Secondary | ICD-10-CM | POA: Diagnosis not present

## 2019-11-26 DIAGNOSIS — O26892 Other specified pregnancy related conditions, second trimester: Secondary | ICD-10-CM | POA: Diagnosis not present

## 2019-11-26 DIAGNOSIS — Z3A19 19 weeks gestation of pregnancy: Secondary | ICD-10-CM | POA: Insufficient documentation

## 2019-11-26 DIAGNOSIS — Z79899 Other long term (current) drug therapy: Secondary | ICD-10-CM | POA: Insufficient documentation

## 2019-11-26 DIAGNOSIS — J45909 Unspecified asthma, uncomplicated: Secondary | ICD-10-CM | POA: Diagnosis not present

## 2019-11-26 LAB — CBC
HCT: 27.9 % — ABNORMAL LOW (ref 36.0–46.0)
Hemoglobin: 8.5 g/dL — ABNORMAL LOW (ref 12.0–15.0)
MCH: 21.7 pg — ABNORMAL LOW (ref 26.0–34.0)
MCHC: 30.5 g/dL (ref 30.0–36.0)
MCV: 71.4 fL — ABNORMAL LOW (ref 80.0–100.0)
Platelets: 166 10*3/uL (ref 150–400)
RBC: 3.91 MIL/uL (ref 3.87–5.11)
RDW: 15.7 % — ABNORMAL HIGH (ref 11.5–15.5)
WBC: 7.9 10*3/uL (ref 4.0–10.5)
nRBC: 0 % (ref 0.0–0.2)

## 2019-11-26 LAB — BASIC METABOLIC PANEL
Anion gap: 7 (ref 5–15)
BUN: 6 mg/dL (ref 6–20)
CO2: 22 mmol/L (ref 22–32)
Calcium: 8.5 mg/dL — ABNORMAL LOW (ref 8.9–10.3)
Chloride: 105 mmol/L (ref 98–111)
Creatinine, Ser: 0.56 mg/dL (ref 0.44–1.00)
GFR calc Af Amer: >60 mL/min (ref >60)
GFR calc non Af Amer: >60 mL/min (ref >60)
Glucose, Bld: 87 mg/dL (ref 70–99)
Potassium: 3.3 mmol/L — ABNORMAL LOW (ref 3.5–5.1)
Sodium: 134 mmol/L — ABNORMAL LOW (ref 135–145)

## 2019-11-26 LAB — URINALYSIS, ROUTINE W REFLEX MICROSCOPIC
Bilirubin Urine: NEGATIVE
Glucose, UA: NEGATIVE mg/dL
Hgb urine dipstick: NEGATIVE
Ketones, ur: NEGATIVE mg/dL
Nitrite: NEGATIVE
Protein, ur: NEGATIVE mg/dL
Specific Gravity, Urine: 1.011 (ref 1.005–1.030)
WBC, UA: >50 WBC/hpf — ABNORMAL HIGH (ref 0–5)
pH: 6 (ref 5.0–8.0)

## 2019-11-26 MED ORDER — PRENATAL COMPLETE 14-0.4 MG PO TABS: ORAL_TABLET | ORAL | 0 refills | Status: DC

## 2019-11-26 MED ORDER — CEPHALEXIN 500 MG PO CAPS: 500.0000 mg | ORAL_CAPSULE | Freq: Two times a day (BID) | ORAL | 0 refills | Status: DC

## 2019-11-26 MED ORDER — CEPHALEXIN 500 MG PO CAPS
500.0000 mg | ORAL_CAPSULE | Freq: Once | ORAL | Status: AC
  Administered 2019-11-26: 500 mg via ORAL
  Filled 2019-11-26: qty 1

## 2019-11-26 MED ORDER — POTASSIUM CHLORIDE CRYS ER 20 MEQ PO TBCR
40.0000 meq | EXTENDED_RELEASE_TABLET | Freq: Once | ORAL | Status: AC
  Administered 2019-11-26: 05:00:00 40 meq via ORAL
  Filled 2019-11-26: qty 2

## 2019-11-26 NOTE — ED Triage Notes (Signed)
Patient is [redacted] weeks pregnant. Patient is complaining she is having a lot pressure in her stomach even when she lay down.

## 2019-11-26 NOTE — ED Provider Notes (Signed)
[place] DEPT Provider Note   CSN: [id number] Arrival date & time: 11/26/19  1324     History Chief Complaint  Patient presents with  . Abdominal Pain    Laura [name] is a 19 y.o. female.  Patient presents to the ED with a chief complaint of abdominal pain in pregnancy.  She states that over the past day or so she has been having crampy abdominal pain.  She states that she is about [redacted] weeks pregnant.  She has had an [place] and an outside facility and reports that everything looked ok.  She is schedule to have another [place] in 1 week.  She denies any vaginal discharge or bleeding.  Denies any fevers, chills.  She has had some nausea and vomiting, but attributes this to being pregnant.  She denies any treatments prior to arrival.    The history is provided by the patient. No language interpreter was used.       Past Medical History:  Diagnosis Date  . Anemia   . Asthma   . Genital herpes     There are no problems to display for this patient.   Past Surgical History:  Procedure Laterality Date  . NO PAST SURGERIES       OB History    Gravida  2   Para      Term      Preterm      AB      Living        SAB      TAB      Ectopic      Multiple      Live Births              History reviewed. No pertinent family history.  Social History   Tobacco Use  . Smoking status: Former Smoker    Types: Cigarettes, Cigars    Quit date: 10/07/2018    Years since quitting: 1.1  . Smokeless tobacco: Never Used  Substance Use Topics  . Alcohol use: No  . Drug use: No    Home Medications Prior to Admission medications   Medication Sig Start Date End Date Taking? Authorizing Provider  albuterol (VENTOLIN HFA) 108 (90 Base) MCG/ACT inhaler Inhale 1-2 puffs into the lungs every 6 (six) hours as needed for wheezing or shortness of breath.   Yes [provider]  ferrous sulfate 325 (65 FE) MG tablet Take 325 mg by mouth  daily. 11/02/19  Yes [provider]  Prenat-Fe Poly-Methfol-FA-DHA (VITAFOL FE+) 90-0.6-0.4-200 MG CAPS Take 1 capsule by mouth daily. 10/14/19  Yes [provider]  valACYclovir (VALTREX) 500 MG tablet Take 500 mg by mouth 2 (two) times daily as needed (outbreak).  07/27/18   [provider]    Allergies    Patient has no known allergies.  Review of Systems   Review of Systems  All other systems reviewed and are negative.   Physical Exam Updated Vital Signs BP 137/89   Pulse 100   Temp 99.1 F (37.3 C) (Oral)   Ht 5\' 3"  (1.6 m)   Wt 59 kg   LMP  (LMP Unknown)   SpO2 100%   BMI 23.03 kg/m   Physical Exam Vitals and nursing note reviewed.  Constitutional:      General: She is not in acute distress.    Appearance: She is well-developed.  HENT:     Head: Normocephalic and atraumatic.  Eyes:  Conjunctiva/sclera: Conjunctivae normal.  Cardiovascular:     Rate and Rhythm: Normal rate and regular rhythm.     Heart sounds: No murmur.  Pulmonary:     Effort: Pulmonary effort is normal. No respiratory distress.     Breath sounds: Normal breath sounds.  Abdominal:     Palpations: Abdomen is soft.     Tenderness: There is no abdominal tenderness.     Comments: [facility] is 156 measured by me  Generalized abdominal discomfort, no focal tenderness  Musculoskeletal:     Cervical back: Neck supple.  Skin:    General: Skin is warm and dry.  Neurological:     Mental Status: She is alert and oriented to person, place, and time.  Psychiatric:        Mood and Affect: Mood normal.        Behavior: Behavior normal.     ED Results / Procedures / Treatments   Labs (all labs ordered are listed, but only abnormal results are displayed) Labs Reviewed  CBC - Abnormal; Notable for the following components:      Result Value   Hemoglobin 8.5 (*)    HCT 27.9 (*)    MCV 71.4 (*)    MCH 21.7 (*)    RDW 15.7 (*)    All other components within normal limits    BASIC METABOLIC PANEL - Abnormal; Notable for the following components:   Sodium 134 (*)    Potassium 3.3 (*)    Calcium 8.5 (*)    All other components within normal limits  URINALYSIS, ROUTINE W REFLEX MICROSCOPIC - Abnormal; Notable for the following components:   APPearance HAZY (*)    Leukocytes,Ua LARGE (*)    WBC, UA >50 (*)    Bacteria, UA MANY (*)    All other components within normal limits  URINE CULTURE    EKG None  Radiology No results found.  Procedures Procedures (including critical care time)  Medications Ordered in ED Medications - No data to display  ED Course  I have reviewed the triage vital signs and the nursing notes.  Pertinent labs & imaging results that were available during my care of the patient were reviewed by me and considered in my medical decision making (see chart for details).    MDM Rules/Calculators/A&P                      Patient here with abdominal pain.  She is about [redacted] weeks pregnant.  Has had [place] which was "normal" a few weeks ago.  Is scheduled for another Korea in a week.    Crampy abdominal pain, but no vaginal discharge or bleeding.    FHR 156 measured by me.  K is 3.3, will give some supplemental K.  HGB is 8.5 which is a little lower for patient (normally 9-10).  She has close follow-up with OBGYN and no bleeding.  No leukocytosis.  No fever.    UA shows >50 WBC, large leuks, many bacteria.  Will send for culture and cover with keflex.   Symptoms could be cystitis or round ligament pain.    I doubt appy.  No evidence of surgical abdomen.  Return precautions given.  Discussed with Dr. Blinda Leatherwood.   Final Clinical Impression(s) / ED Diagnoses Final diagnoses:  Abdominal pain during pregnancy in second trimester  Abnormal urinalysis    Rx / DC Orders ED Discharge Orders    None       Dahlia Client,  Molly Maduro, PA-C 11/26/19 6546    Gilda Crease, MD 11/26/19 [phone]

## 2019-11-28 LAB — URINE CULTURE: Culture: >=100000 — AB

## 2019-11-29 ENCOUNTER — Telehealth: Payer: Self-pay | Admitting: Emergency Medicine

## 2019-11-29 NOTE — Telephone Encounter (Signed)
Post ED Visit - Positive Culture Follow-up  Culture report reviewed by antimicrobial stewardship pharmacist: [facility] []  , Pharm.D. []  Enzo Bi, Pharm.D., BCPS AQ-ID []  , Pharm.D., BCPS []  Celedonio Miyamoto, Pharm.D., BCPS []  [place], Garvin Fila.D., BCPS, AAHIVP []  , Pharm.D., BCPS, AAHIVP []  Georgina Pillion, PharmD, BCPS []  , PharmD, BCPS []  [place], PharmD, BCPS []  [place], PharmD []  , PharmD, BCPS []  Estella Husk, PharmD  Pharmacy Team []  Lysle Pearl, PharmD []  , PharmD []  Phillips Climes, PharmD []  , Rph []  Agapito Games) , PharmD []  Verlan Friends, PharmD []  , PharmD []  Mervyn Gay, PharmD []  , PharmD []  Vinnie Level, PharmD []  Wonda Old[facility], PharmD []  , PharmD [x]  Len Childs, PharmD   Positive urine culture Treated with none, asympomatic, no further patient follow-up is required at this time.  11/29/2019, 1:25 PM

## 2019-12-15 ENCOUNTER — Inpatient Hospital Stay (HOSPITAL_COMMUNITY)
Admission: AD | Admit: 2019-12-15 | Discharge: 2019-12-15 | Disposition: A | Discharge status: Home / Self Care | Payer: Medicaid Other | Attending: Obstetrics & Gynecology | Admitting: Obstetrics & Gynecology

## 2019-12-15 ENCOUNTER — Encounter (HOSPITAL_COMMUNITY): Payer: Self-pay | Admitting: Obstetrics & Gynecology

## 2019-12-15 ENCOUNTER — Other Ambulatory Visit: Payer: Self-pay

## 2019-12-15 DIAGNOSIS — Z3A22 22 weeks gestation of pregnancy: Secondary | ICD-10-CM | POA: Insufficient documentation

## 2019-12-15 DIAGNOSIS — R109 Unspecified abdominal pain: Secondary | ICD-10-CM | POA: Diagnosis present

## 2019-12-15 DIAGNOSIS — Z87891 Personal history of nicotine dependence: Secondary | ICD-10-CM | POA: Diagnosis not present

## 2019-12-15 DIAGNOSIS — O2312 Infections of bladder in pregnancy, second trimester: Secondary | ICD-10-CM | POA: Insufficient documentation

## 2019-12-15 DIAGNOSIS — O99012 Anemia complicating pregnancy, second trimester: Secondary | ICD-10-CM | POA: Insufficient documentation

## 2019-12-15 DIAGNOSIS — N3000 Acute cystitis without hematuria: Secondary | ICD-10-CM

## 2019-12-15 DIAGNOSIS — D509 Iron deficiency anemia, unspecified: Secondary | ICD-10-CM

## 2019-12-15 HISTORY — DX: Unspecified ovarian cyst, unspecified side: N83.209

## 2019-12-15 LAB — WET PREP, GENITAL
Clue Cells Wet Prep HPF POC: NONE SEEN
Sperm: NONE SEEN
Trich, Wet Prep: NONE SEEN
Yeast Wet Prep HPF POC: NONE SEEN

## 2019-12-15 LAB — COMPREHENSIVE METABOLIC PANEL
ALT: 8 U/L (ref 0–44)
AST: 13 U/L — ABNORMAL LOW (ref 15–41)
Albumin: 2.5 g/dL — ABNORMAL LOW (ref 3.5–5.0)
Alkaline Phosphatase: 53 U/L (ref 38–126)
Anion gap: 11 (ref 5–15)
BUN: 7 mg/dL (ref 6–20)
CO2: 24 mmol/L (ref 22–32)
Calcium: 8.8 mg/dL — ABNORMAL LOW (ref 8.9–10.3)
Chloride: 98 mmol/L (ref 98–111)
Creatinine, Ser: 0.83 mg/dL (ref 0.44–1.00)
GFR calc Af Amer: >60 mL/min (ref >60)
GFR calc non Af Amer: >60 mL/min (ref >60)
Glucose, Bld: 82 mg/dL (ref 70–99)
Potassium: 3.4 mmol/L — ABNORMAL LOW (ref 3.5–5.1)
Sodium: 133 mmol/L — ABNORMAL LOW (ref 135–145)
Total Bilirubin: 0.4 mg/dL (ref 0.3–1.2)
Total Protein: 6.6 g/dL (ref 6.5–8.1)

## 2019-12-15 LAB — URINALYSIS, ROUTINE W REFLEX MICROSCOPIC
Bacteria, UA: NONE SEEN
Bilirubin Urine: NEGATIVE
Glucose, UA: NEGATIVE mg/dL
Hgb urine dipstick: NEGATIVE
Ketones, ur: NEGATIVE mg/dL
Nitrite: POSITIVE — AB
Protein, ur: 100 mg/dL — AB
Specific Gravity, Urine: 1.012 (ref 1.005–1.030)
WBC, UA: >50 WBC/hpf — ABNORMAL HIGH (ref 0–5)
pH: 6 (ref 5.0–8.0)

## 2019-12-15 LAB — CBC
HCT: 25.6 % — ABNORMAL LOW (ref 36.0–46.0)
Hemoglobin: 7.8 g/dL — ABNORMAL LOW (ref 12.0–15.0)
MCH: 21.4 pg — ABNORMAL LOW (ref 26.0–34.0)
MCHC: 30.5 g/dL (ref 30.0–36.0)
MCV: 70.3 fL — ABNORMAL LOW (ref 80.0–100.0)
Platelets: 217 10*3/uL (ref 150–400)
RBC: 3.64 MIL/uL — ABNORMAL LOW (ref 3.87–5.11)
RDW: 15.7 % — ABNORMAL HIGH (ref 11.5–15.5)
WBC: 8.9 10*3/uL (ref 4.0–10.5)
nRBC: 0 % (ref 0.0–0.2)

## 2019-12-15 MED ORDER — PHENAZOPYRIDINE HCL 100 MG PO TABS
200.0000 mg | ORAL_TABLET | Freq: Three times a day (TID) | ORAL | Status: DC
  Administered 2019-12-15: 200 mg via ORAL
  Filled 2019-12-15: qty 2

## 2019-12-15 MED ORDER — CEPHALEXIN 500 MG PO CAPS: 500.0000 mg | ORAL_CAPSULE | Freq: Four times a day (QID) | ORAL | 0 refills | Status: DC

## 2019-12-15 MED ORDER — PHENAZOPYRIDINE HCL 200 MG PO TABS: 200.0000 mg | ORAL_TABLET | Freq: Three times a day (TID) | ORAL | 0 refills | Status: DC | PRN

## 2019-12-15 NOTE — MAU Provider Note (Addendum)
Patient Laura [name] is a 19 y.o. G2P1001 at [redacted] weeks pregnant by [place] (per patient) her for abdominal pain. She denies vaginal bleeding, contractions, vaginal discharge, SOB, fever, all over body aches.   She endorses that her urine is cloudy and has an odor in the morning. She reports that it is orange.  History     CSN: [id number]  Arrival date and time: 12/15/19 1328   None     Chief Complaint  Patient presents with  . Abdominal Pain   Abdominal Pain This is a new problem. Episode onset: 2 weeks. The pain is located in the suprapubic region. The pain is at a severity of 4/10. The pain is moderate. The quality of the pain is cramping. The abdominal pain does not radiate. Associated symptoms include diarrhea. Pertinent negatives include no dysuria, nausea or vomiting. Nothing aggravates the pain. The pain is relieved by nothing.    OB History    Gravida  2   Para  1   Term  1   Preterm      AB      Living  1     SAB      TAB      Ectopic      Multiple      Live Births  1           Past Medical History:  Diagnosis Date  . Anemia   . Asthma   . Genital herpes   . Ovarian cyst     Past Surgical History:  Procedure Laterality Date  . NO PAST SURGERIES    . OVARIAN CYST REMOVAL      Family History  Problem Relation Age of Onset  . Epilepsy Father     Social History   Tobacco Use  . Smoking status: Former Smoker    Types: Cigarettes, Cigars    Quit date: 10/07/2018    Years since quitting: 1.1  . Smokeless tobacco: Never Used  Substance Use Topics  . Alcohol use: No  . Drug use: No    Allergies: No Known Allergies  Medications Prior to Admission  Medication Sig Dispense Refill Last Dose  . valACYclovir (VALTREX) 500 MG tablet Take 500 mg by mouth 2 (two) times daily as needed (outbreak).    Past Month at Unknown time  . albuterol (VENTOLIN HFA) 108 (90 Base) MCG/ACT inhaler Inhale 1-2 puffs into the lungs every 6 (six) hours as needed  for wheezing or shortness of breath.   More than a month at Unknown time  . cephALEXin (KEFLEX) 500 MG capsule Take 1 capsule (500 mg total) by mouth 2 (two) times daily. 14 capsule 0   . ferrous sulfate 325 (65 FE) MG tablet Take 325 mg by mouth daily.   12/13/2019  . Prenat-Fe Poly-Methfol-FA-DHA (VITAFOL FE+) 90-0.6-0.4-200 MG CAPS Take 1 capsule by mouth daily.     . Prenatal Vit-Fe Fumarate-FA (PRENATAL COMPLETE) 14-0.4 MG TABS Take 1 tablet daily 60 tablet 0 12/13/2019    Review of Systems  Gastrointestinal: Positive for abdominal pain and diarrhea. Negative for nausea and vomiting.  Genitourinary: Negative for dysuria.   Physical Exam   Blood pressure (!) 70/50, pulse (!) 110, temperature 98.2 F (36.8 C), temperature source Oral, resp. rate 20, height 5\' 3"  (1.6 m), weight 55.9 kg, SpO2 97 %, unknown if currently breastfeeding.  Physical Exam  Constitutional: She is oriented to person, place, and time. She appears well-developed.  HENT:  Head: Normocephalic.  Respiratory:  Effort normal.  GI: Soft. She exhibits no distension and no mass. There is abdominal tenderness. There is no rebound and no guarding.  Abdomen is diffusely tender, no focal tenderness. No CVA tenderness.   Genitourinary:    Vagina normal.     No vaginal discharge.     Genitourinary Comments: NEFG; cervix is long, closed, posterior.    Musculoskeletal:        General: Normal range of motion.     Cervical back: Normal range of motion.  Neurological: She is alert and oriented to person, place, and time.  Skin: Skin is warm and dry.    MAU Course  Procedures  MDM -CBC shows that Hgb is low, at 7.2.  -CMP normal.  -Patient denies pain right now; says its only sometimes since she has been in MAU.  -FHR was  163 -Wet prep negative -UA shows nitrites, will send for culture.    Assessment and Plan    1. Acute cystitis without hematuria   2. Iron deficiency anemia, unspecified iron deficiency anemia  type    2. Patient stable for discharge with plan to keep next prenatal appointment with Dr. Shawnie Pons in April.   3 Start Keflex 500 mg QID for 10 days; she knows that we will call her with any changes to her antibiotics. Continue to take iron and increase iron in her diet.   4. Patient given the name of Center for [facility]; she will call for a NOB.   Charlesetta Garibaldi Karan Ramnauth 12/15/2019, 3:00 PM

## 2019-12-15 NOTE — MAU Note (Signed)
Presents with c/o lower abdominal & back pain that began 2 days ago.  Reports abdominal pain feels like contractions vs cramping.  Denies VB or LOF  Endorses +FM.

## 2019-12-15 NOTE — Discharge Instructions (Signed)
[facility] Providers    [facility]       Phone: [phone]  [facility]   Phone: [phone]  [facility] [place]  Phone: [phone]  [facility] [place]  Phone: [phone]  [facility] [place]  Phone: [phone]  [facility]   Phone: [phone]  [facility]       Phone: [phone]  [facility]    Phone: [phone]   [facility]    Phone: [phone]  [facility]    Phone: [phone]  [facility]    Phone: [phone]  [facility]       Phone: [phone]   [facility]  Phone: [phone]  [facility]    Phone: [phone]  [facility]   Phone: [phone]  [facility]      Phone: [phone]  [facility]    Phone: [phone]   

## 2019-12-15 NOTE — ED Provider Notes (Signed)
MSE was initiated and I personally evaluated the patient and placed orders (if any) at  1:45 PM on December 15, 2019.  The patient appears stable so that the remainder of the MSE may be completed by another provider.  18 y.o. F who is G2P1 who is currently [redacted] weeks pregnant who presents for evaluation of abdominal pain that began about 2-3 days ago. She states pain is intermittent in nature but has gotten more constant and more severe since onset. She describes it as a "contraction or a cramping type feeling" in her entire abdomen. She states that the pain lasts a few minute before resolving. She comes into the ED because she have gotten more severe and the episodes are closer together. She states that pain is generalized with no focal point. No associated nausea/vomiting/fever. She has been able to eat and drink despite the pain. She denies any dysuria but has seen some white flecks in her urine. She denies any vaginal bleeding, loss of fluid. She has still felt baby move.   ROS: Positive abdominal pain. Negative fever, vomiting, nausea, dysuria  PEX:  Const: Appears uncomfortable  Card: RRR Lungs: CTAB Abd: Gravid. Diffuse tenderness noted with no focal tenderness noted. No tenderness noted to McBurney's point.   At this time, patient has no infectious symptoms (fever, nausea/vomiting). She has no focal tenderness but instead has diffuse tenderness noted. Will transfer to MAU.   Discussed patient with Natalia Leatherwood (MAU APP) who accepts patient as transfer to MAU. Notified triage team of Transfer to MAU.    Portions of this note were generated with Scientist, clinical (histocompatibility and immunogenetics). Dictation errors may occur despite best attempts at proofreading.    Maxwell Caul, PA-C 12/15/19 2302    Derwood Kaplan, MD 12/19/19 [phone]

## 2019-12-16 LAB — GC/CHLAMYDIA PROBE AMP (~~LOC~~) NOT AT ARMC
Chlamydia: NEGATIVE
Comment: NEGATIVE
Comment: NORMAL
Neisseria Gonorrhea: NEGATIVE

## 2019-12-17 LAB — CULTURE, OB URINE
Culture: >=100000 — AB
Special Requests: NORMAL

## 2020-02-26 ENCOUNTER — Other Ambulatory Visit: Payer: Self-pay

## 2020-02-26 ENCOUNTER — Inpatient Hospital Stay (HOSPITAL_COMMUNITY)
Admission: AD | Admit: 2020-02-26 | Discharge: 2020-02-26 | Disposition: A | Discharge status: Home / Self Care | Payer: Medicaid Other | Source: Ambulatory Visit | Attending: Obstetrics and Gynecology | Admitting: Obstetrics and Gynecology

## 2020-02-26 ENCOUNTER — Encounter (HOSPITAL_COMMUNITY): Payer: Self-pay | Admitting: Obstetrics and Gynecology

## 2020-02-26 DIAGNOSIS — Z3689 Encounter for other specified antenatal screening: Secondary | ICD-10-CM

## 2020-02-26 DIAGNOSIS — N898 Other specified noninflammatory disorders of vagina: Secondary | ICD-10-CM

## 2020-02-26 DIAGNOSIS — O2343 Unspecified infection of urinary tract in pregnancy, third trimester: Secondary | ICD-10-CM | POA: Insufficient documentation

## 2020-02-26 DIAGNOSIS — Z87891 Personal history of nicotine dependence: Secondary | ICD-10-CM | POA: Insufficient documentation

## 2020-02-26 DIAGNOSIS — O99513 Diseases of the respiratory system complicating pregnancy, third trimester: Secondary | ICD-10-CM | POA: Diagnosis not present

## 2020-02-26 DIAGNOSIS — Z79899 Other long term (current) drug therapy: Secondary | ICD-10-CM | POA: Diagnosis not present

## 2020-02-26 DIAGNOSIS — J45909 Unspecified asthma, uncomplicated: Secondary | ICD-10-CM | POA: Insufficient documentation

## 2020-02-26 DIAGNOSIS — O479 False labor, unspecified: Secondary | ICD-10-CM

## 2020-02-26 DIAGNOSIS — O99891 Other specified diseases and conditions complicating pregnancy: Secondary | ICD-10-CM | POA: Insufficient documentation

## 2020-02-26 DIAGNOSIS — Z3A31 31 weeks gestation of pregnancy: Secondary | ICD-10-CM | POA: Diagnosis not present

## 2020-02-26 DIAGNOSIS — R197 Diarrhea, unspecified: Secondary | ICD-10-CM | POA: Diagnosis not present

## 2020-02-26 DIAGNOSIS — R8271 Bacteriuria: Secondary | ICD-10-CM

## 2020-02-26 LAB — URINALYSIS, ROUTINE W REFLEX MICROSCOPIC
Bilirubin Urine: NEGATIVE
Glucose, UA: NEGATIVE mg/dL
Ketones, ur: NEGATIVE mg/dL
Nitrite: NEGATIVE
Protein, ur: 30 mg/dL — AB
RBC / HPF: >50 RBC/hpf — ABNORMAL HIGH (ref 0–5)
Specific Gravity, Urine: 1.01 (ref 1.005–1.030)
WBC, UA: >50 WBC/hpf — ABNORMAL HIGH (ref 0–5)
pH: 6 (ref 5.0–8.0)

## 2020-02-26 LAB — WET PREP, GENITAL
Clue Cells Wet Prep HPF POC: NONE SEEN
Sperm: NONE SEEN
Trich, Wet Prep: NONE SEEN
Yeast Wet Prep HPF POC: NONE SEEN

## 2020-02-26 MED ORDER — CEFADROXIL 500 MG PO CAPS: 500.0000 mg | ORAL_CAPSULE | Freq: Two times a day (BID) | ORAL | 0 refills | Status: DC

## 2020-02-26 NOTE — MAU Provider Note (Signed)
History     CSN: [id number]  Arrival date and time: 02/26/20 2113   First Provider Initiated Contact with Patient 02/26/20 2157      Chief Complaint  Patient presents with  . Contractions  . Vaginal Discharge   Laura [name] is a 19 y.o. G2P1001 at [redacted]w[redacted]d who receives care at Jac Canavan MD in [facility]  She presents today for Contractions and Vaginal Discharge.  She states she woke up at 7am and noticed some thick pink discharge.  She states she went to work, but while she was driving noticed some contractions.  She states upon arriving to work she continued to have contractions and called her MD office.  She states she is not sure how often she is contracting, but states she is having abdominal tightening that occurs at least 4x/hr.  She endorses fetal movement and denies vaginal bleeding or leaking, but reports the discharge continues. She reports drinking about 3 bottles of water and had sex 2 days ago.  Patient reports that she currently has no pain.  Patient also reports diarrhea that started this morning and she has had about 4 episodes today.      OB History    Gravida  2   Para  1   Term  1   Preterm      AB      Living  1     SAB      TAB      Ectopic      Multiple      Live Births  1           Past Medical History:  Diagnosis Date  . Anemia   . Asthma   . Genital herpes   . Ovarian cyst     Past Surgical History:  Procedure Laterality Date  . NO PAST SURGERIES    . OVARIAN CYST REMOVAL      Family History  Problem Relation Age of Onset  . Epilepsy Father     Social History   Tobacco Use  . Smoking status: Former Smoker    Types: Cigarettes, Cigars    Quit date: 10/07/2018    Years since quitting: 1.3  . Smokeless tobacco: Never Used  Substance Use Topics  . Alcohol use: No  . Drug use: No    Allergies: No Known Allergies  Medications Prior to Admission  Medication Sig Dispense Refill Last Dose  . albuterol (VENTOLIN HFA)  108 (90 Base) MCG/ACT inhaler Inhale 1-2 puffs into the lungs every 6 (six) hours as needed for wheezing or shortness of breath.   Past Month at Unknown time  . ferrous sulfate 325 (65 FE) MG tablet Take 325 mg by mouth daily.   02/26/2020 at Unknown time  . Prenatal Vit-Fe Fumarate-FA (PRENATAL COMPLETE) 14-0.4 MG TABS Take 1 tablet daily 60 tablet 0 02/26/2020 at Unknown time  . valACYclovir (VALTREX) 500 MG tablet Take 500 mg by mouth 2 (two) times daily as needed (outbreak).    Past Month at Unknown time  . cephALEXin (KEFLEX) 500 MG capsule Take 1 capsule (500 mg total) by mouth 4 (four) times daily. 40 capsule 0   . phenazopyridine (PYRIDIUM) 200 MG tablet Take 1 tablet (200 mg total) by mouth 3 (three) times daily as needed for pain. 10 tablet 0   . Prenat-Fe Poly-Methfol-FA-DHA (VITAFOL FE+) 90-0.6-0.4-200 MG CAPS Take 1 capsule by mouth daily.       Review of Systems  Constitutional: Negative for  chills and fever.  Respiratory: Negative for cough and shortness of breath.   Gastrointestinal: Positive for diarrhea (4 episodes) and nausea. Negative for constipation and vomiting.  Genitourinary: Positive for vaginal discharge. Negative for difficulty urinating, dysuria, pelvic pain and vaginal bleeding.  Musculoskeletal: Positive for back pain.  Neurological: Negative for dizziness, light-headedness and headaches.   Physical Exam   Blood pressure 121/78, pulse 98, temperature 97.9 F (36.6 C), temperature source Oral, resp. rate 18, height 5\' 3"  (1.6 m), weight 64.2 kg, SpO2 100 %, unknown if currently breastfeeding.  Physical Exam  Constitutional: She is oriented to person, place, and time. She appears well-developed and well-nourished.  HENT:  Head: Normocephalic and atraumatic.  Eyes: Conjunctivae are normal.  Cardiovascular: Normal rate.  Respiratory: Effort normal.  GI: Soft.  Gravid--fundal height appears AGA, Soft, NT   Genitourinary:    No vaginal bleeding.  No bleeding in  the vagina.    Genitourinary Comments: Speculum Exam: -Normal External Genitalia: Non tender, Scant amt white curdy discharge at introitus. ? Toilet tissue remnants -Vaginal Vault: Pink mucosa with good rugae. No apparent discharge noted -wet prep collected -Cervix:Pink, no lesions, cysts, or polyps.  Appears closed. No active bleeding from os-GC/CT collected -Bimanual Exam: Dilation: 1 Effacement (%): 50 Station: Ballotable Exam by:: [email or web address], CNM    Musculoskeletal:        General: No edema. Normal range of motion.     Cervical back: Normal range of motion.  Neurological: She is alert and oriented to person, place, and time.  Skin: Skin is warm and dry.  Psychiatric: She has a normal mood and affect. Her behavior is normal.    Fetal Assessment 150 bpm, Mod Var, -Decels, +Accels Toco: Mild Irritability  MAU Course   Results for orders placed or performed during the hospital encounter of 02/26/20 (from the past 24 hour(s))  Urinalysis, Routine w reflex microscopic     Status: Abnormal   Collection Time: 02/26/20  9:21 PM  Result Value Ref Range   Color, Urine YELLOW YELLOW   APPearance CLOUDY (A) CLEAR   Specific Gravity, Urine 1.010 1.005 - 1.030   pH 6.0 5.0 - 8.0   Glucose, UA NEGATIVE NEGATIVE mg/dL   Hgb urine dipstick MODERATE (A) NEGATIVE   Bilirubin Urine NEGATIVE NEGATIVE   Ketones, ur NEGATIVE NEGATIVE mg/dL   Protein, ur 30 (A) NEGATIVE mg/dL   Nitrite NEGATIVE NEGATIVE   Leukocytes,Ua LARGE (A) NEGATIVE   RBC / HPF >50 (H) 0 - 5 RBC/hpf   WBC, UA >50 (H) 0 - 5 WBC/hpf   Bacteria, UA MANY (A) NONE SEEN   Squamous Epithelial / LPF 0-5 0 - 5  Wet prep, genital     Status: Abnormal   Collection Time: 02/26/20 10:13 PM   Specimen: PATH Cytology Cervicovaginal Ancillary Only  Result Value Ref Range   Yeast Wet Prep HPF POC NONE SEEN NONE SEEN   Trich, Wet Prep NONE SEEN NONE SEEN   Clue Cells Wet Prep HPF POC NONE SEEN NONE SEEN   WBC, Wet Prep HPF POC MANY  (A) NONE SEEN   Sperm NONE SEEN    No results found.  MDM PE Labs:UA, Wet Prep, GC/CT EFM  Assessment and Plan  19 year old G2P1001  SIUP at 31.6weeks Cat I FT BH Contractions Vaginal Discharge  -Patient informed that symptoms likely related to recent sexual activity and diarrhea. -Educated on how sex can cause some spotting as well as contractions when sperm is  released into the vagina. -Educated on how diarrhea can also causes contractions as the frequent peristalsis of the bowels can be irritable to the uterus.  -POC reviewed.  -Exam performed and findings discussed. -Cultures collected and pending.  -Patient declines medication for diarrhea.  Will provide information on bland diet.  -NST reactive-Okay to discontinue Monitor.  Cherre Robins MSN, CNM 02/26/2020, 9:57 PM   Reassessment (10:38 PM)  -Wet prep returns with insignificant findings. -Results discussed with patient. -Informed that GC/CT will return within 2-3 days. -UA with +Leuks, +Bacteria, +Blood, Sent for culture -Informed of findings and will treat prophylactically for UTI. -Patient agreeable and without questions or concerns. -Rx for Cefadroxil 500mg  BID x 7 days.  -Instructed to keep appt as scheduled for March 01, 2020 -Precautions given. -Encouraged to call or return to MAU if symptoms worsen or with the onset of new symptoms. -Discharged to home in stable condition.  March 03, 2020 MSN, CNM Advanced Practice Provider, [facility]

## 2020-02-26 NOTE — Discharge Instructions (Signed)
Braxton Hicks Contractions °Contractions of the uterus can occur throughout pregnancy, but they are not always a sign that you are in labor. You may have practice contractions called Braxton Hicks contractions. These false labor contractions are sometimes confused with true labor. °What are Braxton Hicks contractions? °Braxton Hicks contractions are tightening movements that occur in the muscles of the uterus before labor. Unlike true labor contractions, these contractions do not result in opening (dilation) and thinning of the cervix. Toward the end of pregnancy (32-34 weeks), Braxton Hicks contractions can happen more often and may become stronger. These contractions are sometimes difficult to tell apart from true labor because they can be very uncomfortable. You should not feel embarrassed if you go to the hospital with false labor. °Sometimes, the only way to tell if you are in true labor is for your health care provider to look for changes in the cervix. The health care provider will do a physical exam and may monitor your contractions. If you are not in true labor, the exam should show that your cervix is not dilating and your water has not broken. °If there are no other health problems associated with your pregnancy, it is completely safe for you to be sent home with false labor. You may continue to have Braxton Hicks contractions until you go into true labor. °How to tell the difference between true labor and false labor °True labor °· Contractions last 30-70 seconds. °· Contractions become very regular. °· Discomfort is usually felt in the top of the uterus, and it spreads to the lower abdomen and low back. °· Contractions do not go away with walking. °· Contractions usually become more intense and increase in frequency. °· The cervix dilates and gets thinner. °False labor °· Contractions are usually shorter and not as strong as true labor contractions. °· Contractions are usually irregular. °· Contractions  are often felt in the front of the lower abdomen and in the groin. °· Contractions may go away when you walk around or change positions while lying down. °· Contractions get weaker and are shorter-lasting as time goes on. °· The cervix usually does not dilate or become thin. °Follow these instructions at home: ° °· Take over-the-counter and prescription medicines only as told by your health care provider. °· Keep up with your usual exercises and follow other instructions from your health care provider. °· Eat and drink lightly if you think you are going into labor. °· If Braxton Hicks contractions are making you uncomfortable: °? Change your position from lying down or resting to walking, or change from walking to resting. °? Sit and rest in a tub of warm water. °? Drink enough fluid to keep your urine pale yellow. Dehydration may cause these contractions. °? Do slow and deep breathing several times an hour. °· Keep all follow-up prenatal visits as told by your health care provider. This is important. °Contact a health care provider if: °· You have a fever. °· You have continuous pain in your abdomen. °Get help right away if: °· Your contractions become stronger, more regular, and closer together. °· You have fluid leaking or gushing from your vagina. °· You pass blood-tinged mucus (bloody show). °· You have bleeding from your vagina. °· You have low back pain that you never had before. °· You feel your baby’s head pushing down and causing pelvic pressure. °· Your baby is not moving inside you as much as it used to. °Summary °· Contractions that occur before labor are   called Braxton Hicks contractions, false labor, or practice contractions. °· Braxton Hicks contractions are usually shorter, weaker, farther apart, and less regular than true labor contractions. True labor contractions usually become progressively stronger and regular, and they become more frequent. °· Manage discomfort from Braxton Hicks contractions  by changing position, resting in a warm bath, drinking plenty of water, or practicing deep breathing. °This information is not intended to replace advice given to you by your health care provider. Make sure you discuss any questions you have with your health care provider. °Document Revised: 08/28/2017 Document Reviewed: 01/29/2017 °Elsevier Patient Education © 2020 [employer] ° °

## 2020-02-26 NOTE — MAU Note (Addendum)
Pt states this morning when she woke up she wiped and noticed some pink spotting. Has had contractions all day. Reports she is not sure how far apart they are because she doesn't know how to count them. Reports that she is 1cm dilated. Denies LOF or vaginal bleeding. Denies itching or irritation. Gets [facility] with Dr. Shawnie Pons.

## 2020-02-28 LAB — GC/CHLAMYDIA PROBE AMP (~~LOC~~) NOT AT ARMC
Chlamydia: NEGATIVE
Comment: NEGATIVE
Comment: NORMAL
Neisseria Gonorrhea: NEGATIVE

## 2020-02-29 LAB — CULTURE, OB URINE: Culture: >=100000 — AB

## 2020-04-25 ENCOUNTER — Ambulatory Visit: Payer: Self-pay

## 2020-05-06 ENCOUNTER — Encounter (HOSPITAL_COMMUNITY): Payer: Self-pay

## 2020-05-06 ENCOUNTER — Emergency Department (HOSPITAL_COMMUNITY)
Admission: EM | Admit: 2020-05-06 | Discharge: 2020-05-06 | Disposition: A | Discharge status: Home / Self Care | Payer: Medicaid Other | Attending: Emergency Medicine | Admitting: Emergency Medicine

## 2020-05-06 ENCOUNTER — Other Ambulatory Visit: Payer: Self-pay

## 2020-05-06 DIAGNOSIS — J45909 Unspecified asthma, uncomplicated: Secondary | ICD-10-CM | POA: Insufficient documentation

## 2020-05-06 DIAGNOSIS — Z79899 Other long term (current) drug therapy: Secondary | ICD-10-CM | POA: Insufficient documentation

## 2020-05-06 DIAGNOSIS — N644 Mastodynia: Secondary | ICD-10-CM | POA: Diagnosis not present

## 2020-05-06 DIAGNOSIS — Z87891 Personal history of nicotine dependence: Secondary | ICD-10-CM | POA: Diagnosis not present

## 2020-05-06 MED ORDER — KETOROLAC TROMETHAMINE 60 MG/2ML IM SOLN
60.0000 mg | Freq: Once | INTRAMUSCULAR | Status: AC
  Administered 2020-05-06: 60 mg via INTRAMUSCULAR
  Filled 2020-05-06: qty 2

## 2020-05-06 MED ORDER — OXYCODONE-ACETAMINOPHEN 5-325 MG PO TABS: 2.0000 | ORAL_TABLET | ORAL | 0 refills | Status: AC | PRN

## 2020-05-06 NOTE — ED Provider Notes (Signed)
°  Face-to-face evaluation   History: She complains of bilateral breast pain for 2 weeks, despite taking amoxicillin for suspected infection.  She received this from her PCP after telephone consultation.  She denies history of known breast cancer.  She denies discharge from the nipples.  She has some soreness, in her left axilla.  She has not had any weight loss.  No prior similar problems.  Physical exam: Alert, calm, cooperative.  Abnormal appearing breast, bilaterally.  Mild erythema right breast without visible deformity.  On palpation there is a mass partially beneath the areola, inferiorly, about 3.5 x 2.5 x 3.0 cm.  There is no right nipple discharge.  No other right breast masses appreciated.  On the left breast there is visible erythema and deformity, inferior to the areola.  There is overlying skin dimpling and wrinkling, with slight color change of the skin as compared to surrounding skin.  There is a mass inferior to the left areola, approximately 3.5 x 3.5 x 4.5 cm.  The masses in both breasts are extremely tender to palpation.  There is axillary adenopathy, anteriorly in the left axilla.  No nipple discharge from the left breast.  12:20 PM-I discussed the case with general surgery, Dr. Gerrit Friends, who will facilitate follow-up by having his office be available for help, and to see the patient if needed.  The primary point of contact for follow-up will be the [facility], patient will be told to contact them in the morning for follow-up appointment, tomorrow.  [facility] will be available for assistance with follow-up planning and to see the patient if needed, according to Dr. Gerrit Friends.  Medical screening examination/treatment/procedure(s) were conducted as a shared visit with non-physician practitioner(s) and myself.  I personally evaluated the patient during the encounter    Mancel Bale, MD 05/10/20 1924

## 2020-05-06 NOTE — Discharge Instructions (Addendum)
Please continue your antibiotic as prescribed.  Please make sure to call the breast center tomorrow first thing in the morning to schedule an appointment as soon as possible.  Please follow-up with the surgeon.  Dr. Gerrit Friends is aware of your case, he is a Development worker, international aid.  If you are not able to make an appointment with the breast center, please follow-up with him at [facility] surgery.  I have provided the contact information for both of these centers.  Please seek immediate medical help as there is concern for cancer.  Return to the ER if your symptoms worsen.

## 2020-05-06 NOTE — ED Triage Notes (Signed)
Patient states she gave birth 4 weeks ago and has been having pain and redness of both breast x 2 weeks and worsening today. Patient states she saw her PCP and was prescribed Amoxicillin, but having worsening symptoms.

## 2020-05-06 NOTE — ED Provider Notes (Signed)
[facility]-EMERGENCY DEPT Provider Note   CSN: [id number] Arrival date & time: 05/06/20  0932     History Chief Complaint  Patient presents with   Breast Pain    Laura [name] is a 19 y.o. female.  HPI 19 year old female with a history of anemia, asthma, genital herpes presents to the ER with complaints of breast pain.  Patient states she gave birth to her child about 4 weeks ago, started developing pain to her left and right breast.  She is not breast-feeding, and is not pumping.  She states her right breast was worse than her left.  She did has no notable discharge.  She was seen by her PCP via telemedicine visit, and was given amoxicillin which he has been taking for about a week.  Symptoms are not improving.  She states that she feels that her breasts are "ripping apart".  She also endorses some pain in her right upper armpit as well.  Denies any fevers or chills.  No weakness, chest pain or shortness of breath.  She has never had this happen before.  No family history of breast cancer.  No unintended weight loss.    Past Medical History:  Diagnosis Date   Anemia    Asthma    Genital herpes    Ovarian cyst     There are no problems to display for this patient.   Past Surgical History:  Procedure Laterality Date   NO PAST SURGERIES     OVARIAN CYST REMOVAL       OB History    Gravida  2   Para  1   Term  1   Preterm      AB      Living  1     SAB      TAB      Ectopic      Multiple      Live Births  1           Family History  Problem Relation Age of Onset   Epilepsy Father     Social History   Tobacco Use   Smoking status: Former Smoker    Types: Cigarettes, Cigars    Quit date: 10/07/2018    Years since quitting: 1.5   Smokeless tobacco: Never Used  Vaping Use   Vaping Use: Former  Substance Use Topics   Alcohol use: No   Drug use: No    Home Medications Prior to Admission medications     Medication Sig Start Date End Date Taking? Authorizing Provider  albuterol (VENTOLIN HFA) 108 (90 Base) MCG/ACT inhaler Inhale 1-2 puffs into the lungs every 6 (six) hours as needed for wheezing or shortness of breath.   Yes [provider]  amoxicillin (AMOXIL) 500 MG tablet Take 500 mg by mouth 3 (three) times daily. 04/26/20  Yes [provider]  valACYclovir (VALTREX) 500 MG tablet Take 500 mg by mouth 2 (two) times daily as needed (outbreak).  07/27/18  Yes [provider]  cefadroxil (DURICEF) 500 MG capsule Take 1 capsule (500 mg total) by mouth 2 (two) times daily. Patient not taking: Reported on 05/06/2020 02/26/20   Gerrit Heck, CNM  oxyCODONE-acetaminophen (PERCOCET/ROXICET) 5-325 MG tablet Take 2 tablets by mouth every 4 (four) hours as needed for up to 3 days for severe pain. 05/06/20 05/09/20  Mare Ferrari, PA-C  Prenatal Vit-Fe Fumarate-FA (PRENATAL COMPLETE) 14-0.4 MG TABS Take 1 tablet daily Patient not taking:  Reported on 05/06/2020 11/26/19   Roxy Horseman, PA-C  sertraline (ZOLOFT) 25 MG tablet Take 25 mg by mouth daily. 05/01/20   [provider]    Allergies    Patient has no known allergies.  Review of Systems   Review of Systems  Constitutional: Negative for chills and fever.  Respiratory: Negative for shortness of breath.   Cardiovascular: Negative for chest pain.  Skin: Positive for color change and rash.    Physical Exam Updated Vital Signs BP 117/77    Pulse 84    Temp 98.6 F (37 C) (Oral)    Resp 18    Ht 5\' 3"  (1.6 m)    Wt 55.8 kg    LMP  (LMP Unknown)    SpO2 100%    Breastfeeding Unknown    BMI 21.79 kg/m   Physical Exam Vitals and nursing note reviewed.  Constitutional:      General: She is not in acute distress.    Appearance: Normal appearance. She is well-developed.     Comments: Alert, nontoxic appearing, resting comfortably  HENT:     Head: Normocephalic and atraumatic.     Mouth/Throat:     Mouth: Mucous  membranes are moist.     Pharynx: Oropharynx is clear.  Eyes:     Conjunctiva/sclera: Conjunctivae normal.  Cardiovascular:     Rate and Rhythm: Normal rate and regular rhythm.     Pulses: Normal pulses.     Heart sounds: No murmur heard.   Pulmonary:     Effort: Pulmonary effort is normal. No respiratory distress.     Breath sounds: Normal breath sounds.  Abdominal:     Palpations: Abdomen is soft.     Tenderness: There is no abdominal tenderness.  Musculoskeletal:        General: Normal range of motion.     Cervical back: Neck supple.  Skin:    General: Skin is warm and dry.     Findings: Erythema and lesion present. No bruising.     Comments:  Physical exam and bedside ultrasound performed by Dr. . Right breast with mild erythema medial to the areola. Palpable mass below and inferior to the areola of about 3.5x 2.5x 3 cm. No nipple discharge bilaterally. Left breast with visible overlying skin changes with deformity, erythematous; palpated mass inferior to the left areola, approximately 3.5 x 3.5 x 4.5 cm. Axillary adenopathy on the left noted to the anterior axilla. Both masses extremely tender to palpation. Bedside ultrasound with hypoeochic masses, no visible or palpable fluctuance.   Neurological:     General: No focal deficit present.     Mental Status: She is alert and oriented to person, place, and time.     ED Results / Procedures / Treatments   Labs (all labs ordered are listed, but only abnormal results are displayed) Labs Reviewed - No data to display  EKG None  Radiology No results found.  Procedures Procedures (including critical care time)  Medications Ordered in ED Medications  ketorolac (TORADOL) injection 60 mg (60 mg Intramuscular Given 05/06/20 1244)    ED Course  I have reviewed the triage vital signs and the nursing notes.  Pertinent labs & imaging results that were available during my care of the patient were reviewed by me and considered  in my medical decision making (see chart for details).    MDM Rules/Calculators/A&P  19 year old female with bilateral breast pain, left worse than right. On presentation, she is alert, oriented, nontoxic-appearing, though does appear to be in pain.  Vitals are reassuring, she is afebrile.  Physical exam and bedside ultrasound performed by Dr. Effie Shy, she has masses in her breast on each side, left worse than right.  Left breast with overlying skin changes and deformity.  Erythema spreading to the areola.  No visible discharge or fluctuance.  Right breast with smaller mass inferior to the areola.  Both masses extremely tender.  DDx includes cancer vs mastitis vs abscess.  There is significant concern for cancer given the presentation.  Dr. Effie Shy and I spoke with Dr. Gerrit Friends with  general surgery, he recommends that the patient follow-up with the breast center tomorrow.  If she has difficulty with referrals, he will see her in the office tomorrow.  Patient informed to continue taking the amoxicillin as prescribed.  Will provide pain medication as the masses appear to be very painful.  Return precautions discussed.  She voices understanding is agreeable to this plan.  At this stage in the ED course, the patient is medically screened and stable for discharge.  Patient was seen and evaluated by Dr. Effie Shy who is agreeable to the above plan and disposition. Final Clinical Impression(s) / ED Diagnoses Final diagnoses:  Breast pain    Rx / DC Orders ED Discharge Orders         Ordered    oxyCODONE-acetaminophen (PERCOCET/ROXICET) 5-325 MG tablet  Every 4 hours PRN     Discontinue  Reprint     05/06/20 1250           Leone Brand 05/06/20 1329    Mancel Bale, MD 05/10/20 1924    Mancel Bale, MD 05/10/20 1924

## 2020-05-07 ENCOUNTER — Other Ambulatory Visit: Payer: Self-pay | Admitting: Student

## 2020-05-07 ENCOUNTER — Ambulatory Visit
Admission: RE | Admit: 2020-05-07 | Discharge: 2020-05-07 | Disposition: A | Discharge status: Home / Self Care | Payer: Medicaid Other | Source: Ambulatory Visit | Attending: Student | Admitting: Student

## 2020-05-07 DIAGNOSIS — N611 Abscess of the breast and nipple: Secondary | ICD-10-CM

## 2020-05-14 ENCOUNTER — Inpatient Hospital Stay: Admission: RE | Admit: 2020-05-14 | Payer: Medicaid Other | Source: Ambulatory Visit

## 2020-05-14 ENCOUNTER — Other Ambulatory Visit: Payer: Medicaid Other

## 2020-05-14 ENCOUNTER — Other Ambulatory Visit: Payer: Self-pay | Admitting: Student

## 2020-05-14 DIAGNOSIS — N611 Abscess of the breast and nipple: Secondary | ICD-10-CM

## 2020-06-01 ENCOUNTER — Other Ambulatory Visit (INDEPENDENT_AMBULATORY_CARE_PROVIDER_SITE_OTHER): Payer: Medicaid Other

## 2020-06-01 DIAGNOSIS — Z20822 Contact with and (suspected) exposure to covid-19: Secondary | ICD-10-CM | POA: Diagnosis not present

## 2020-06-01 DIAGNOSIS — R05 Cough: Secondary | ICD-10-CM | POA: Diagnosis not present

## 2020-06-01 LAB — POC COVID19 BINAXNOW: SARS Coronavirus 2 Ag: NEGATIVE

## 2020-06-01 NOTE — Progress Notes (Signed)
Patient was exposed to family member who has tested positive for COVID. Patient started with symptoms on Tuesday. Patient with runny nose and cough. Patient test is negative but given around family who is positive. Patient given all precautions on quarantining and what to do if she feels symptoms are getting worse. Patient verbalized understanding.

## 2020-11-09 ENCOUNTER — Emergency Department (HOSPITAL_COMMUNITY): Payer: Medicaid Other

## 2020-11-09 ENCOUNTER — Emergency Department (HOSPITAL_COMMUNITY)
Admission: EM | Admit: 2020-11-09 | Discharge: 2020-11-09 | Disposition: A | Discharge status: Home / Self Care | Payer: Medicaid Other | Attending: Emergency Medicine | Admitting: Emergency Medicine

## 2020-11-09 ENCOUNTER — Other Ambulatory Visit: Payer: Self-pay

## 2020-11-09 ENCOUNTER — Encounter (HOSPITAL_COMMUNITY): Payer: Self-pay | Admitting: Emergency Medicine

## 2020-11-09 DIAGNOSIS — J45909 Unspecified asthma, uncomplicated: Secondary | ICD-10-CM | POA: Diagnosis not present

## 2020-11-09 DIAGNOSIS — R52 Pain, unspecified: Secondary | ICD-10-CM

## 2020-11-09 DIAGNOSIS — Z87891 Personal history of nicotine dependence: Secondary | ICD-10-CM | POA: Diagnosis not present

## 2020-11-09 DIAGNOSIS — R0789 Other chest pain: Secondary | ICD-10-CM | POA: Diagnosis present

## 2020-11-09 DIAGNOSIS — R079 Chest pain, unspecified: Secondary | ICD-10-CM

## 2020-11-09 DIAGNOSIS — U071 COVID-19: Secondary | ICD-10-CM | POA: Diagnosis not present

## 2020-11-09 LAB — BASIC METABOLIC PANEL
Anion gap: 9 (ref 5–15)
BUN: 8 mg/dL (ref 6–20)
CO2: 21 mmol/L — ABNORMAL LOW (ref 22–32)
Calcium: 8.9 mg/dL (ref 8.9–10.3)
Chloride: 106 mmol/L (ref 98–111)
Creatinine, Ser: 0.79 mg/dL (ref 0.44–1.00)
GFR, Estimated: >60 mL/min (ref >60)
Glucose, Bld: 94 mg/dL (ref 70–99)
Potassium: 3.5 mmol/L (ref 3.5–5.1)
Sodium: 136 mmol/L (ref 135–145)

## 2020-11-09 LAB — CBC
HCT: 32.5 % — ABNORMAL LOW (ref 36.0–46.0)
Hemoglobin: 10.2 g/dL — ABNORMAL LOW (ref 12.0–15.0)
MCH: 21.9 pg — ABNORMAL LOW (ref 26.0–34.0)
MCHC: 31.4 g/dL (ref 30.0–36.0)
MCV: 69.9 fL — ABNORMAL LOW (ref 80.0–100.0)
Platelets: 128 10*3/uL — ABNORMAL LOW (ref 150–400)
RBC: 4.65 MIL/uL (ref 3.87–5.11)
RDW: 16.3 % — ABNORMAL HIGH (ref 11.5–15.5)
WBC: 3.7 10*3/uL — ABNORMAL LOW (ref 4.0–10.5)
nRBC: 0 % (ref 0.0–0.2)

## 2020-11-09 LAB — TROPONIN I (HIGH SENSITIVITY): Troponin I (High Sensitivity): 3 ng/L (ref <18)

## 2020-11-09 LAB — I-STAT BETA HCG BLOOD, ED (MC, WL, AP ONLY): I-stat hCG, quantitative: <5 m[IU]/mL (ref <5)

## 2020-11-09 MED ORDER — KETOROLAC TROMETHAMINE 60 MG/2ML IM SOLN
60.0000 mg | Freq: Once | INTRAMUSCULAR | Status: AC
  Administered 2020-11-09: 60 mg via INTRAMUSCULAR
  Filled 2020-11-09: qty 2

## 2020-11-09 MED ORDER — IBUPROFEN 600 MG PO TABS: 600.0000 mg | ORAL_TABLET | Freq: Four times a day (QID) | ORAL | 0 refills | Status: DC | PRN

## 2020-11-09 NOTE — ED Triage Notes (Signed)
Patient her with generalized body aches, chest pain, and shortness of breath that started two days ago. Patient states she tested positive for COVID  at the [facility] testing center in [facility] yesterday, patient has test result with her. SpO2 99% on room air, patient in no apparent distress at this time.

## 2020-11-09 NOTE — Discharge Instructions (Addendum)
Your work-up today was reassuring.  I recommend you take ibuprofen 600 mg every 6 hours as needed for chest discomfort and body aches, particularly given your improvement here in ED with Toradol.  Please also continue with Vicks VapoRub.  Please maintain isolation precautions.  Check your temperature regularly and take Tylenol as needed for fever control.  Increase your oral hydration and continue to eat regular meals to avoid electrolyte derangement and further fatigue.  I recommend over-the-counter medications as needed for symptom relief.  You may wish to consider obtaining a pulse oximeter over-the-counter at a pharmacy.  If you are below 90% oxygen saturation, you will need to return to the ED.   Follow-up with your primary care provider regarding today's encounter and for ongoing management.  You had pancytopenia here on CBC.  Continue to follow-up with hematology, per your primary care provider, as scheduled.  Return to the ED or seek immediate medical attention should you experience any new or worsening symptoms.    If you have additional questions, contact your local health department or call the epidemiologist on call at [phone] (available 24/7). ? This guidance is subject to change. For the most up-to-date guidance from [facility], please refer to their website: [email or web address]

## 2020-11-09 NOTE — ED Provider Notes (Signed)
[facility] EMERGENCY DEPARTMENT Provider Note   CSN: [id number] Arrival date & time: 11/09/20  8413     History Chief Complaint  Patient presents with  . Generalized Body Aches    Laura [name] is a 20 y.o. female with PMH significant for asthma who presents the ED with chief complaint of chest pain and exertional dyspnea. Patient tells me that she has recently been referred to a hematologist given her anemia and leukopenia.  Patient reports that she tested positive for COVID-19 at the testing center located within the [facility] yesterday and has a positive testing here with her today.  On my examination, patient reports that approximately 6 PM last evening she developed left-sided "pressure" chest pain that has been getting progressively worse. She says it is constant. Patient states that she has difficulty ambulating to her bathroom due to her chest discomfort and associated shortness of breath. She states that she has been using her albuterol at home with increasing frequency. She states that her body hurts "everywhere". Patient has not been eating well, but states that she has been trying to drink plenty of fluids. Patient just feels generally fatigued and weak and endorses fevers and chills. She lives with her boyfriend and children, a 68-year-old and a 56-month-old. Everybody in the household is sick with COVID-19. She states that she was fully immunized. She denies any nausea, emesis, cough, hemoptysis, history of clots or clotting disorder, oral contraceptive medications, abdominal pain, headache, numbness or blurred vision, or other focal neurologic deficits.  Patient has been treating her chest discomfort symptoms with Tylenol, Mucinex, and Vicks VapoRub.  HPI     Past Medical History:  Diagnosis Date  . Anemia   . Asthma   . Genital herpes   . Ovarian cyst     There are no problems to display for this patient.   Past Surgical History:   Procedure Laterality Date  . NO PAST SURGERIES    . OVARIAN CYST REMOVAL       OB History    Gravida  2   Para  1   Term  1   Preterm      AB      Living  1     SAB      IAB      Ectopic      Multiple      Live Births  1           Family History  Problem Relation Age of Onset  . Epilepsy Father     Social History   Tobacco Use  . Smoking status: Former Smoker    Types: Cigarettes, Cigars    Quit date: 10/07/2018    Years since quitting: 2.0  . Smokeless tobacco: Never Used  Vaping Use  . Vaping Use: Former  Substance Use Topics  . Alcohol use: No  . Drug use: No    Home Medications Prior to Admission medications   Medication Sig Start Date End Date Taking? Authorizing Provider  ibuprofen (ADVIL) 600 MG tablet Take 1 tablet (600 mg total) by mouth every 6 (six) hours as needed. 11/09/20  Yes Lorelee New, PA-C  albuterol (VENTOLIN HFA) 108 (90 Base) MCG/ACT inhaler Inhale 1-2 puffs into the lungs every 6 (six) hours as needed for wheezing or shortness of breath.    [provider]  amoxicillin (AMOXIL) 500 MG tablet Take 500 mg by mouth 3 (three) times daily. 04/26/20   [provider]  cefadroxil (DURICEF) 500 MG capsule Take 1 capsule (500 mg total) by mouth 2 (two) times daily. Patient not taking: Reported on 05/06/2020 02/26/20   Gerrit Heck, CNM  Prenatal Vit-Fe Fumarate-FA (PRENATAL COMPLETE) 14-0.4 MG TABS Take 1 tablet daily Patient not taking: Reported on 05/06/2020 11/26/19   Roxy Horseman, PA-C  sertraline (ZOLOFT) 25 MG tablet Take 25 mg by mouth daily. 05/01/20   [provider]  valACYclovir (VALTREX) 500 MG tablet Take 500 mg by mouth 2 (two) times daily as needed (outbreak).  07/27/18   [provider]    Allergies    Patient has no known allergies.  Review of Systems   Review of Systems  All other systems reviewed and are negative.   Physical Exam Updated Vital Signs BP 120/82   Pulse 85    Temp 99.4 F (37.4 C)   Resp 18   SpO2 100%   Physical Exam Vitals and nursing note reviewed. Exam conducted with a chaperone present.  Constitutional:      General: She is not in acute distress.    Appearance: She is ill-appearing. She is not toxic-appearing.  HENT:     Head: Normocephalic and atraumatic.  Eyes:     General: No scleral icterus.    Conjunctiva/sclera: Conjunctivae normal.  Cardiovascular:     Rate and Rhythm: Normal rate and regular rhythm.     Pulses: Normal pulses.     Heart sounds: Normal heart sounds.  Pulmonary:     Effort: Pulmonary effort is normal. No respiratory distress.     Breath sounds: Normal breath sounds. No wheezing or rales.     Comments: CTA bilaterally. No increased work of breathing. No wheezing or rales noted. 99% on RA at rest.  Left-sided chest wall tenderness reproducible with palpation. Chest:     Chest wall: Tenderness present.  Abdominal:     General: Abdomen is flat. There is no distension.     Palpations: Abdomen is soft.     Tenderness: There is no abdominal tenderness. There is no guarding.  Musculoskeletal:     Cervical back: Normal range of motion.     Right lower leg: No edema.     Left lower leg: No edema.  Skin:    General: Skin is dry.     Capillary Refill: Capillary refill takes less than 2 seconds.  Neurological:     Mental Status: She is alert and oriented to person, place, and time.     GCS: GCS eye subscore is 4. GCS verbal subscore is 5. GCS motor subscore is 6.  Psychiatric:        Mood and Affect: Mood normal.        Behavior: Behavior normal.        Thought Content: Thought content normal.     ED Results / Procedures / Treatments   Labs (all labs ordered are listed, but only abnormal results are displayed) Labs Reviewed  CBC - Abnormal; Notable for the following components:      Result Value   WBC 3.7 (*)    Hemoglobin 10.2 (*)    HCT 32.5 (*)    MCV 69.9 (*)    MCH 21.9 (*)    RDW 16.3 (*)     Platelets 128 (*)    All other components within normal limits  BASIC METABOLIC PANEL - Abnormal; Notable for the following components:   CO2 21 (*)    All other components within normal limits  I-STAT BETA HCG BLOOD, ED ([facility], [facility], [facility] ONLY)  TROPONIN I (HIGH SENSITIVITY)    EKG EKG Interpretation  Date/Time:  Friday November 09 2020 08:41:58 EST Ventricular Rate:  114 PR Interval:  126 QRS Duration: 82 QT Interval:  294 QTC Calculation: 405 R Axis:   81 Text Interpretation: Sinus tachycardia Right atrial enlargement T wave abnormality, consider inferior ischemia T wave abnormality, consider anterolateral ischemia Abnormal ECG ST-T wave changes since last tracing Confirmed by Benjiman Core [phone]) on 11/09/2020 9:14:39 AM   Radiology DG Chest Portable 1 View  Result Date: 11/09/2020 CLINICAL DATA:  20 year old female with shortness of breath EXAM: PORTABLE CHEST 1 VIEW COMPARISON:  None. FINDINGS: The heart size and mediastinal contours are within normal limits. Both lungs are clear. The visualized skeletal structures are unremarkable. IMPRESSION: No active disease. Electronically Signed   By: Gilmer Mor D.O.   On: 11/09/2020 09:02    Procedures Procedures   Medications Ordered in ED Medications  ketorolac (TORADOL) injection 60 mg (60 mg Intramuscular Given 11/09/20 1020)    ED Course  I have reviewed the triage vital signs and the nursing notes.  Pertinent labs & imaging results that were available during my care of the patient were reviewed by me and considered in my medical decision making (see chart for details).    MDM Rules/Calculators/A&P                          Laura [name] was evaluated in Emergency Department on 11/09/2020 for the symptoms described in the history of present illness. She was evaluated in the context of the global COVID-19 pandemic, which necessitated consideration that the patient might be at risk for infection with the SARS-CoV-2 virus  that causes COVID-19. Institutional protocols and algorithms that pertain to the evaluation of patients at risk for COVID-19 are in a state of rapid change based on information released by regulatory bodies including the CDC and federal and state organizations. These policies and algorithms were followed during the patient's care in the ED.  I personally reviewed patient's medical chart and all notes from triage and staff during today's encounter. I have also ordered and reviewed all labs and imaging that I felt to be medically necessary in the evaluation of this patient's complaints and with consideration of their physical exam. If needed, translation services were available and utilized.   Patient and her entire family are COVID-19 positive.  She is fully immunized and her only risk factor is asthma.  Given patient's left-sided chest pain, will obtain basic lab work-up including one-time troponin.  Chest x-ray is personally reviewed and without any acute cardiopulmonary disease.  EKG with nonspecific ST/T wave abnormalities when compared to prior tracings.  No diffuse ST elevation.  Given chronicity of chest discomfort, one-time troponin is reasonable.  Beta-hCG is negative for pregnancy.   CBC notable for leukopenia, microcytic anemia, and thrombocytopenia.  She states that she has recently been referred to hematology by her primary care provider.  Anemia with hemoglobin of 10.2 is improved when compared to recent labs.  Patient will be discharged with symptomatic treatment.  Patient is tolerating food and liquid without difficulty and I do not believe that laboratory work-up would yield any significant findings.  Low suspicion for electrolyte derangement, but emphasized the importance of eating regularly.  I also emphasized the importance of rest, continued oral hydration, and antipyretics as needed for fever control.  She reports that she can  follow-up with her primary care provider regarding today's  encounter and for ongoing evaluation.  Patient is not demonstrating any increased work of breathing or exertional hypoxia.  Given that they are low risk, they are not the ideal candidate for outpatient infusion therapy, particularly given low supplies.   On subsequent evaluation, patient is feeling improved after Toradol administration.  We will encourage 600 mg ibuprofen every 6 hours as needed for body aches and chest discomfort.    They were provided opportunity to ask any additional questions and have none at this time.  Prior to discharge patient is feeling well, agreeable with plan for discharge home.  They have expressed understanding of verbal discharge instructions as well as return precautions and are agreeable to the plan.    Final Clinical Impression(s) / ED Diagnoses Final diagnoses:  COVID-19  Body aches  Nonspecific chest pain    Rx / DC Orders ED Discharge Orders         Ordered    ibuprofen (ADVIL) 600 MG tablet  Every 6 hours PRN        11/09/20 1054           Elvera Maria 11/09/20 1054    Benjiman Core, MD 11/09/20 1519

## 2021-01-07 DIAGNOSIS — D72819 Decreased white blood cell count, unspecified: Secondary | ICD-10-CM

## 2021-01-07 HISTORY — DX: Decreased white blood cell count, unspecified: D72.819

## 2021-01-14 ENCOUNTER — Emergency Department (HOSPITAL_COMMUNITY)
Admission: EM | Admit: 2021-01-14 | Discharge: 2021-01-15 | Disposition: A | Discharge status: Home / Self Care | Payer: Medicaid Other | Attending: Emergency Medicine | Admitting: Emergency Medicine

## 2021-01-14 ENCOUNTER — Encounter (HOSPITAL_COMMUNITY): Payer: Self-pay | Admitting: *Deleted

## 2021-01-14 ENCOUNTER — Emergency Department (HOSPITAL_COMMUNITY): Payer: Medicaid Other

## 2021-01-14 ENCOUNTER — Other Ambulatory Visit: Payer: Self-pay

## 2021-01-14 DIAGNOSIS — R102 Pelvic and perineal pain: Secondary | ICD-10-CM

## 2021-01-14 DIAGNOSIS — Z87891 Personal history of nicotine dependence: Secondary | ICD-10-CM | POA: Insufficient documentation

## 2021-01-14 DIAGNOSIS — Z3A01 Less than 8 weeks gestation of pregnancy: Secondary | ICD-10-CM | POA: Insufficient documentation

## 2021-01-14 DIAGNOSIS — O200 Threatened abortion: Secondary | ICD-10-CM | POA: Diagnosis not present

## 2021-01-14 DIAGNOSIS — J45909 Unspecified asthma, uncomplicated: Secondary | ICD-10-CM | POA: Diagnosis not present

## 2021-01-14 LAB — WET PREP, GENITAL
Clue Cells Wet Prep HPF POC: NONE SEEN
Sperm: NONE SEEN
Trich, Wet Prep: NONE SEEN
Yeast Wet Prep HPF POC: NONE SEEN

## 2021-01-14 LAB — POC URINE PREG, ED: Preg Test, Ur: NEGATIVE

## 2021-01-14 LAB — HCG, QUANTITATIVE, PREGNANCY: hCG, Beta Chain, Quant, S: 316 m[IU]/mL — ABNORMAL HIGH (ref <5)

## 2021-01-14 NOTE — ED Provider Notes (Addendum)
[facility] EMERGENCY DEPARTMENT Provider Note   CSN: [id number] Arrival date & time: 01/14/21  1610     History No chief complaint on file.   Laura [name] is a 20 y.o. female.  The history is provided by the patient and medical records.   Laura [name] is a 20 y.o. female who presents to the Emergency Department complaining of pelvic cramping for the last several days with one week of vaginally discharge. She had a positive home pregnancy test on April 16 positive test at urgent care that day as well. She was evaluated at urgent care two days ago and started on cefdinir for urinary tract infection. She denies any new sexual partners. She states she has a history of irregular menstrual cycles in her last menstrual cycles last month. She had two prior pregnancies with no complications. Denies fevers, nausea, vomiting, diarrhea.    Past Medical History:  Diagnosis Date  . Anemia   . Asthma   . Genital herpes   . Ovarian cyst     There are no problems to display for this patient.   Past Surgical History:  Procedure Laterality Date  . NO PAST SURGERIES    . OVARIAN CYST REMOVAL       OB History    Gravida  2   Para  1   Term  1   Preterm      AB      Living  1     SAB      IAB      Ectopic      Multiple      Live Births  1           Family History  Problem Relation Age of Onset  . Epilepsy Father     Social History   Tobacco Use  . Smoking status: Former Smoker    Types: Cigarettes, Cigars    Quit date: 10/07/2018    Years since quitting: 2.2  . Smokeless tobacco: Never Used  Vaping Use  . Vaping Use: Former  Substance Use Topics  . Alcohol use: No  . Drug use: No    Home Medications Prior to Admission medications   Medication Sig Start Date End Date Taking? Authorizing Provider  albuterol (VENTOLIN HFA) 108 (90 Base) MCG/ACT inhaler Inhale 1-2 puffs into the lungs every 6 (six) hours as needed for wheezing or  shortness of breath.    [provider]  amoxicillin (AMOXIL) 500 MG tablet Take 500 mg by mouth 3 (three) times daily. 04/26/20   [provider]  cefadroxil (DURICEF) 500 MG capsule Take 1 capsule (500 mg total) by mouth 2 (two) times daily. Patient not taking: Reported on 05/06/2020 02/26/20   Gerrit Heck, CNM  ibuprofen (ADVIL) 600 MG tablet Take 1 tablet (600 mg total) by mouth every 6 (six) hours as needed. 11/09/20   Lorelee New, PA-C  Prenatal Vit-Fe Fumarate-FA (PRENATAL COMPLETE) 14-0.4 MG TABS Take 1 tablet daily Patient not taking: Reported on 05/06/2020 11/26/19   Roxy Horseman, PA-C  sertraline (ZOLOFT) 25 MG tablet Take 25 mg by mouth daily. 05/01/20   [provider]  valACYclovir (VALTREX) 500 MG tablet Take 500 mg by mouth 2 (two) times daily as needed (outbreak).  07/27/18   [provider]    Allergies    Patient has no known allergies.  Review of Systems   Review of Systems  All other systems reviewed and are negative.  Physical Exam Updated Vital Signs BP 119/74   Pulse 83   Temp 98.5 F (36.9 C) (Oral)   Resp 18   LMP  (LMP Unknown)   SpO2 100%   Physical Exam Vitals and nursing note reviewed.  Constitutional:      Appearance: She is well-developed.  HENT:     Head: Normocephalic and atraumatic.  Cardiovascular:     Rate and Rhythm: Normal rate and regular rhythm.     Heart sounds: No murmur heard.   Pulmonary:     Effort: Pulmonary effort is normal. No respiratory distress.     Breath sounds: Normal breath sounds.  Abdominal:     Palpations: Abdomen is soft.     Tenderness: There is no guarding or rebound.     Comments: Mild superpubic tenderness  Genitourinary:    Comments: Moderate thick yellow vaginal discharge. Os closed. No CMT or adnexal tenderness Musculoskeletal:        General: No tenderness.  Skin:    General: Skin is warm and dry.  Neurological:     Mental Status: She is alert and oriented to  person, place, and time.  Psychiatric:        Behavior: Behavior normal.     ED Results / Procedures / Treatments   Labs (all labs ordered are listed, but only abnormal results are displayed) Labs Reviewed  WET PREP, GENITAL - Abnormal; Notable for the following components:      Result Value   WBC, Wet Prep HPF POC MANY (*)    All other components within normal limits  HCG, QUANTITATIVE, PREGNANCY - Abnormal; Notable for the following components:   hCG, Beta Chain, Quant, S 316 (*)    All other components within normal limits  POC URINE PREG, ED  GC/CHLAMYDIA PROBE AMP ([place]) NOT AT [facility]    EKG None  Radiology US OB LESS THAN 14 WEEKS W/ OB TRANSVAGINAL AND DOPPLER  Result Date: 01/15/2021 CLINICAL DATA:  Pelvic pain EXAM: OBSTETRIC <14 WK [place] AND TRANSVAGINAL OB US DOPPLER ULTRASOUND OF OVARIES TECHNIQUE: Both transabdominal and transvaginal ultrasound examinations were performed for complete evaluation of the gestation as well as the maternal uterus, adnexal regions, and pelvic cul-de-sac. Transvaginal technique was performed to assess early pregnancy. Color and duplex Doppler ultrasound was utilized to evaluate blood flow to the ovaries. COMPARISON:  None. FINDINGS: Intrauterine gestational sac: Single irregular intrauterine gestational sac with internal echoes/debris. Yolk sac:  Not visualized Embryo:  Not visualized Cardiac Activity: Not visualized Heart Rate:  bpm MSD: 17 mm   6 w   4 d CRL:     mm    w  d                  [place] EDC: Subchorionic hemorrhage:  None visualized. Maternal uterus/adnexae: 3.3 x 2.9 x 1.6 cm cystic lesion within the right ovary with small internal daughter cyst. Pulsed Doppler evaluation of both ovaries demonstrates normal appearing low-resistance arterial and venous waveforms. IMPRESSION: Complex intrauterine gestational sac with irregularity and internal echoes. No visible yolk sac or fetal pole. Findings suspicious but not yet diagnostic of failed  pregnancy. Recommend follow-up ultrasound in 10-14 days. 3.3 cm complex cyst in the right ovary.  No evidence of torsion. Electronically Signed   By: Charlett Nose M.D.   On: 01/15/2021 00:06    Procedures Procedures   Medications Ordered in ED Medications - No data to display  ED Course  I have reviewed the  triage vital signs and the nursing notes.  Pertinent labs & imaging results that were available during my care of the patient were reviewed by me and considered in my medical decision making (see chart for details).    MDM Rules/Calculators/A&P                         patient is a G3P2 here for evaluation of abdominal cramping, had a positive home pregnancy test. Urine pregnancy test here is negative, will send a formal quant. On examination she has vaginal discharge but no CMT. pelvic ultrasound is concerning for failed pregnancy. Current presentation is not consistent with septic abortion. Discussed with patient findings of ultrasound and importance of close OB follow-up. Recommend continuing her antibiotics. Return precautions discussed.  Final Clinical Impression(s) / ED Diagnoses Final diagnoses:  Pregnancy with early threatened abortion    Rx / DC Orders ED Discharge Orders    None       Tilden Fossa, MD 01/14/21 2346    Tilden Fossa, MD 01/15/21 [phone]

## 2021-01-14 NOTE — ED Triage Notes (Signed)
Emergency Medicine Provider Triage Evaluation Note  Laura [name] , a 20 y.o. female  was evaluated in triage.  Pt complains of + preg test, vaginal discharge, pelvic cramping. Unknown lmp..  Review of Systems  Positive: + preg test, vaginal discharge, pelvic cramping. Unknown lmp. Negative: fever  Physical Exam  BP 109/61 (BP Location: Left Arm)   Pulse 82   Temp 98.5 F (36.9 C) (Oral)   Resp 18   LMP  (LMP Unknown)   SpO2 100%  Gen:   Awake, no distress   HEENT:  Atraumatic  Resp:  Normal effort  Cardiac:  Normal rate  Abd:   Nondistended MSK:   Moves extremities without difficulty  Neuro:  Speech clear   Medical Decision Making  Medically screening exam initiated at 6:32 PM.  Appropriate orders placed.  Laura [name] was informed that the remainder of the evaluation will be completed by another provider, this initial triage assessment does not replace that evaluation, and the importance of remaining in the ED until their evaluation is complete.  Clinical Impression   20 y/o f with vag d/c, pelvic pain  MSE was initiated and I personally evaluated the patient and placed orders (if any) at  6:32 PM on January 14, 2021.  The patient appears stable so that the remainder of the MSE may be completed by another provider.    Karrie Meres, [place] 01/14/21 1832

## 2021-01-14 NOTE — ED Triage Notes (Signed)
Pt reports + home preg test, unsure of lmp. Having abd cramping x 2 days. Today began having vaginal discharge. Denies bleeding.

## 2021-01-15 LAB — GC/CHLAMYDIA PROBE AMP (~~LOC~~) NOT AT ARMC
Chlamydia: NEGATIVE
Comment: NEGATIVE
Comment: NORMAL
Neisseria Gonorrhea: NEGATIVE

## 2021-01-15 NOTE — Discharge Instructions (Addendum)
You had a pelvic ultrasound today that is concerning for a miscarriage. Your pregnancy hormone was 316. Call your OB/GYN's office later today for evaluation in the next 24 hours. Get rechecked immediately if you develop fevers or new concerning symptoms.

## 2021-01-15 NOTE — ED Notes (Signed)
Patient verbalized understanding of discharge instructions. Opportunity for questions and answers.  

## 2021-01-27 ENCOUNTER — Encounter (HOSPITAL_COMMUNITY): Payer: Self-pay | Admitting: Emergency Medicine

## 2021-01-27 ENCOUNTER — Other Ambulatory Visit: Payer: Self-pay

## 2021-01-27 ENCOUNTER — Emergency Department (HOSPITAL_COMMUNITY)
Admission: EM | Admit: 2021-01-27 | Discharge: 2021-01-27 | Disposition: A | Discharge status: Home / Self Care | Payer: Medicaid Other | Attending: Emergency Medicine | Admitting: Emergency Medicine

## 2021-01-27 ENCOUNTER — Emergency Department (HOSPITAL_COMMUNITY): Payer: Medicaid Other

## 2021-01-27 DIAGNOSIS — R0600 Dyspnea, unspecified: Secondary | ICD-10-CM

## 2021-01-27 DIAGNOSIS — R000 Tachycardia, unspecified: Secondary | ICD-10-CM | POA: Diagnosis not present

## 2021-01-27 DIAGNOSIS — R0781 Pleurodynia: Secondary | ICD-10-CM | POA: Diagnosis not present

## 2021-01-27 DIAGNOSIS — O26891 Other specified pregnancy related conditions, first trimester: Secondary | ICD-10-CM | POA: Insufficient documentation

## 2021-01-27 DIAGNOSIS — J45909 Unspecified asthma, uncomplicated: Secondary | ICD-10-CM | POA: Diagnosis not present

## 2021-01-27 DIAGNOSIS — O99511 Diseases of the respiratory system complicating pregnancy, first trimester: Secondary | ICD-10-CM | POA: Insufficient documentation

## 2021-01-27 DIAGNOSIS — R0609 Other forms of dyspnea: Secondary | ICD-10-CM | POA: Diagnosis not present

## 2021-01-27 DIAGNOSIS — Z3A01 Less than 8 weeks gestation of pregnancy: Secondary | ICD-10-CM | POA: Diagnosis not present

## 2021-01-27 DIAGNOSIS — Z8616 Personal history of COVID-19: Secondary | ICD-10-CM | POA: Diagnosis not present

## 2021-01-27 DIAGNOSIS — Z87891 Personal history of nicotine dependence: Secondary | ICD-10-CM | POA: Diagnosis not present

## 2021-01-27 LAB — CBC
HCT: 34.7 % — ABNORMAL LOW (ref 36.0–46.0)
Hemoglobin: 10.4 g/dL — ABNORMAL LOW (ref 12.0–15.0)
MCH: 21.3 pg — ABNORMAL LOW (ref 26.0–34.0)
MCHC: 30 g/dL (ref 30.0–36.0)
MCV: 71.1 fL — ABNORMAL LOW (ref 80.0–100.0)
Platelets: 181 10*3/uL (ref 150–400)
RBC: 4.88 MIL/uL (ref 3.87–5.11)
RDW: 14.9 % (ref 11.5–15.5)
WBC: 9.7 10*3/uL (ref 4.0–10.5)
nRBC: 0 % (ref 0.0–0.2)

## 2021-01-27 LAB — TROPONIN I (HIGH SENSITIVITY): Troponin I (High Sensitivity): <2 ng/L (ref <18)

## 2021-01-27 LAB — BASIC METABOLIC PANEL
Anion gap: 6 (ref 5–15)
BUN: 10 mg/dL (ref 6–20)
CO2: 23 mmol/L (ref 22–32)
Calcium: 9.5 mg/dL (ref 8.9–10.3)
Chloride: 105 mmol/L (ref 98–111)
Creatinine, Ser: 0.6 mg/dL (ref 0.44–1.00)
GFR, Estimated: >60 mL/min (ref >60)
Glucose, Bld: 88 mg/dL (ref 70–99)
Potassium: 3.7 mmol/L (ref 3.5–5.1)
Sodium: 134 mmol/L — ABNORMAL LOW (ref 135–145)

## 2021-01-27 LAB — I-STAT BETA HCG BLOOD, ED (MC, WL, AP ONLY): I-stat hCG, quantitative: >2000 m[IU]/mL — ABNORMAL HIGH (ref <5)

## 2021-01-27 MED ORDER — IOHEXOL 350 MG/ML SOLN
100.0000 mL | Freq: Once | INTRAVENOUS | Status: AC | PRN
  Administered 2021-01-27: 75 mL via INTRAVENOUS

## 2021-01-27 NOTE — Discharge Instructions (Signed)
You are seen in the ER for shortness of breath and chest pain.  The work-up in the ER included CT scan, which is negative for blood clots.  There is no evidence of pneumonia.  Rest of your lab work-up is also reassuring.  We recommend that he follow-up with your primary care doctor for further assessment.

## 2021-01-27 NOTE — ED Triage Notes (Signed)
Emergency Medicine Provider Triage Evaluation Note  Laura [name] , a 20 y.o. female  was evaluated in triage.  Pt complains of constant chest pain x 2 days. Fells like tightness in chest, worse when taking a deep breath. No history DVT or PE. [redacted] weeks pregnant, had US showing IUP.  Poor PO intake x 1 week. Has been taking diclegis.   Review of Systems  Positive: Chest pain Negative: Fever, cough, emesis, abdominal pain, shortness of breath  Physical Exam  BP 90/66 (BP Location: Left Arm)   Pulse (!) 110   Temp 98.7 F (37.1 C) (Oral)   Resp (!) 24   Ht 5\' 3"  (1.6 m)   Wt 58.1 kg   LMP  (LMP Unknown)   SpO2 100%   BMI 22.67 kg/m  Gen:   Awake, no distress   HEENT:  Atraumatic Resp:  Normal effort  Cardiac:  tachycardic Abd:   Nondistended, nontender  MSK:   Moves extremities without difficulty  Neuro:  Speech clear   Medical Decision Making  Medically screening exam initiated at 2:22 PM.  Appropriate orders placed.  Jasime Westergren was informed that the remainder of the evaluation will be completed by another provider, this initial triage assessment does not replace that evaluation, and the importance of remaining in the ED until their evaluation is complete.  Clinical Impression  Chest pain. Labs ordered.   Gwynn Burly, PA-C 01/27/21 1426

## 2021-01-27 NOTE — ED Triage Notes (Signed)
Reports chest pain and nausea x2 days. Reports she had [place] earlier this week and is approx [redacted] weeks pregnant. She reports the chest pain feels tight. Denies radiation. She also reports dizziness when standing up and when in the shower. No sig medical history.

## 2021-01-27 NOTE — ED Provider Notes (Signed)
[facility]-EMERGENCY DEPT Provider Note   CSN: [id number] Arrival date & time: 01/27/21  1309     History Chief Complaint  Patient presents with  . Chest Pain    Laura [name] is a 20 y.o. female.  HPI     20 year old female comes in with chief complaint of shortness of breath, chest pain. She is G3, P2 about [redacted] weeks pregnant right now.  Current pain has been present for 2 days.  Pain is worse with deep inspiration.  She has no associated cough, fevers, chills.  She also reports exertional shortness of breath.  There is no history of PE, DVT in her but there is some family history of blood dyscrasia.  Additionally, patient had COVID-19 2 months ago and her last child was delivered 9 months ago.  No blood loss, abd pain, fevers, chills.  Past Medical History:  Diagnosis Date  . Anemia   . Asthma   . Genital herpes   . Ovarian cyst     There are no problems to display for this patient.   Past Surgical History:  Procedure Laterality Date  . NO PAST SURGERIES    . OVARIAN CYST REMOVAL       OB History    Gravida  3   Para  1   Term  1   Preterm      AB      Living  1     SAB      IAB      Ectopic      Multiple      Live Births  1           Family History  Problem Relation Age of Onset  . Epilepsy Father     Social History   Tobacco Use  . Smoking status: Former Smoker    Types: Cigarettes, Cigars    Quit date: 10/07/2018    Years since quitting: 2.3  . Smokeless tobacco: Never Used  Vaping Use  . Vaping Use: Former  Substance Use Topics  . Alcohol use: No  . Drug use: No    Home Medications Prior to Admission medications   Medication Sig Start Date End Date Taking? Authorizing Provider  albuterol (VENTOLIN HFA) 108 (90 Base) MCG/ACT inhaler Inhale 1-2 puffs into the lungs every 6 (six) hours as needed for wheezing or shortness of breath.    [provider]  amoxicillin (AMOXIL) 500 MG tablet Take  500 mg by mouth 3 (three) times daily. 04/26/20   [provider]  cefadroxil (DURICEF) 500 MG capsule Take 1 capsule (500 mg total) by mouth 2 (two) times daily. Patient not taking: Reported on 05/06/2020 02/26/20   Gerrit Heck, CNM  ibuprofen (ADVIL) 600 MG tablet Take 1 tablet (600 mg total) by mouth every 6 (six) hours as needed. 11/09/20   Lorelee New, PA-C  Prenatal Vit-Fe Fumarate-FA (PRENATAL COMPLETE) 14-0.4 MG TABS Take 1 tablet daily Patient not taking: Reported on 05/06/2020 11/26/19   Roxy Horseman, PA-C  sertraline (ZOLOFT) 25 MG tablet Take 25 mg by mouth daily. 05/01/20   [provider]  valACYclovir (VALTREX) 500 MG tablet Take 500 mg by mouth 2 (two) times daily as needed (outbreak).  07/27/18   [provider]    Allergies    Patient has no known allergies.  Review of Systems   Review of Systems  Constitutional: Positive for activity change.  HENT: Negative for congestion.   Respiratory: Positive  for shortness of breath.   Cardiovascular: Positive for chest pain.  Gastrointestinal: Negative for nausea and vomiting.  Allergic/Immunologic: Negative for immunocompromised state.  Hematological: Does not bruise/bleed easily.  All other systems reviewed and are negative.   Physical Exam Updated Vital Signs BP 104/74   Pulse 90   Temp 98.6 F (37 C)   Resp 16   Ht 5\' 3"  (1.6 m)   Wt 58.1 kg   LMP  (LMP Unknown)   SpO2 100%   BMI 22.67 kg/m   Physical Exam Vitals and nursing note reviewed.  Constitutional:      Appearance: She is well-developed.  HENT:     Head: Normocephalic and atraumatic.  Neck:     Vascular: No JVD.  Cardiovascular:     Rate and Rhythm: Normal rate.  Pulmonary:     Effort: Pulmonary effort is normal. No tachypnea or accessory muscle usage.  Abdominal:     General: Bowel sounds are normal.  Musculoskeletal:     Cervical back: Normal range of motion and neck supple.     Right lower leg: No tenderness. No  edema.     Left lower leg: No tenderness. No edema.  Skin:    General: Skin is warm and dry.  Neurological:     Mental Status: She is alert and oriented to person, place, and time.     ED Results / Procedures / Treatments   Labs (all labs ordered are listed, but only abnormal results are displayed) Labs Reviewed  BASIC METABOLIC PANEL - Abnormal; Notable for the following components:      Result Value   Sodium 134 (*)    All other components within normal limits  CBC - Abnormal; Notable for the following components:   Hemoglobin 10.4 (*)    HCT 34.7 (*)    MCV 71.1 (*)    MCH 21.3 (*)    All other components within normal limits  I-STAT BETA HCG BLOOD, ED ([facility], [facility], [facility] ONLY) - Abnormal; Notable for the following components:   I-stat hCG, quantitative >2,000.0 (*)    All other components within normal limits  TROPONIN I (HIGH SENSITIVITY)  TROPONIN I (HIGH SENSITIVITY)    EKG EKG Interpretation  Date/Time:  Sunday Jan 27 2021 13:38:29 EDT Ventricular Rate:  110 PR Interval:  128 QRS Duration: 93 QT Interval:  309 QTC Calculation: 418 R Axis:   70 Text Interpretation: Sinus tachycardia Biatrial enlargement Borderline T wave abnormalities No acute changes No significant change since last tracing Confirmed by [birth year] [id number]) on 01/27/2021 5:13:31 PM   Radiology CT Angio Chest PE W and/or Wo Contrast  Result Date: 01/27/2021 CLINICAL DATA:  Anterior chest pain with deep breath, shortness of breath. Four weeks pregnant. EXAM: CT ANGIOGRAPHY CHEST WITH CONTRAST TECHNIQUE: Multidetector CT imaging of the chest was performed using the standard protocol during bolus administration of intravenous contrast. Multiplanar CT image reconstructions and MIPs were obtained to evaluate the vascular anatomy. CONTRAST:  34mL OMNIPAQUE IOHEXOL 350 MG/ML SOLN COMPARISON:  None. FINDINGS: Cardiovascular: Some of the most peripheral segmental and subsegmental pulmonary artery branches cannot  be definitively characterized due to patient breathing motion artifact, however, there is no pulmonary embolism identified within the main, lobar or central segmental pulmonary arteries bilaterally. No thoracic aortic aneurysm or evidence of aortic dissection. No pericardial effusion. Mediastinum/Nodes: Normal residual thymic tissue within the anterior mediastinum. No mass or enlarged lymph nodes are seen within the mediastinum or perihilar regions. Esophagus appears normal.  Trachea and central bronchi are unremarkable. Lungs/Pleura: Lungs are clear.  No pleural effusion or pneumothorax. Upper Abdomen: Limited images of the upper abdomen are unremarkable. Musculoskeletal: No chest wall abnormality. No acute or significant osseous findings. Review of the MIP images confirms the above findings. IMPRESSION: Negative exam. No pulmonary embolism seen, with mild study limitations detailed above. Lungs are clear. Electronically Signed   By: Bary Richard M.D.   On: 01/27/2021 16:55   DG Chest Port 1 View  Result Date: 01/27/2021 CLINICAL DATA:  Pleuritic chest pain EXAM: PORTABLE CHEST 1 VIEW COMPARISON:  None. FINDINGS: The heart size and mediastinal contours are within normal limits. Both lungs are clear. The visualized skeletal structures are unremarkable. IMPRESSION: No active disease. Electronically Signed   By: Elige Ko   On: 01/27/2021 15:14    Procedures Procedures   Medications Ordered in ED Medications  iohexol (OMNIPAQUE) 350 MG/ML injection 100 mL (75 mLs Intravenous Contrast Given 01/27/21 1636)    ED Course  I have reviewed the triage vital signs and the nursing notes.  Pertinent labs & imaging results that were available during my care of the patient were reviewed by me and considered in my medical decision making (see chart for details).    MDM Rules/Calculators/A&P                          20 year old female who is G3, P2, and her first trimester of pregnancy comes in a chief  complaint of chest pain and shortness of breath.  She has pleuritic chest pain without any new cough and clear lung exam.  Positive shortness of breath without any rales, lower extremity edema.  No infection-like symptoms on history.  In addition to being pregnant, now for the second time in a year, she also had COVID-19 2 or 3 months back.  All of this increased estrogen state and inflammatory state puts her at high risk for PE.  Patient is noted to be tachycardic.  No hypoxia.  I discussed with the patient my concerns and the fact that she is pregnant, and the potential risk to the fetus from the CT angiogram of the chest.  Patient has agreed to proceeding with CT angiogram of the chest at this time, understanding that we do not know the full extent of exposure the fetus might have from radiation and the contrast.   5:29 PM CT angiogram did not reveal PE, pneumonia.  Unclear what the etiology for her pleuritic chest pain is.  Clinically there is no CHF.   For now, we will advised her to follow-up with her PCP.  She might need evaluation such as echocardiogram, thyroid profile etc. Strict ER return precautions have been discussed, and patient is agreeing with the plan and is comfortable with the workup done and the recommendations from the ER.  Final Clinical Impression(s) / ED Diagnoses Final diagnoses:  Pleuritic chest pain  Exertional dyspnea    Rx / DC Orders ED Discharge Orders    None       Derwood Kaplan, MD 01/27/21 1730

## 2021-02-11 ENCOUNTER — Emergency Department (HOSPITAL_COMMUNITY)
Admission: EM | Admit: 2021-02-11 | Discharge: 2021-02-11 | Disposition: A | Discharge status: Home / Self Care | Payer: Medicaid Other | Attending: Emergency Medicine | Admitting: Emergency Medicine

## 2021-02-11 ENCOUNTER — Other Ambulatory Visit: Payer: Self-pay

## 2021-02-11 ENCOUNTER — Encounter (HOSPITAL_COMMUNITY): Payer: Self-pay | Admitting: Emergency Medicine

## 2021-02-11 ENCOUNTER — Emergency Department (HOSPITAL_COMMUNITY): Payer: Medicaid Other

## 2021-02-11 DIAGNOSIS — Z3A08 8 weeks gestation of pregnancy: Secondary | ICD-10-CM | POA: Insufficient documentation

## 2021-02-11 DIAGNOSIS — R110 Nausea: Secondary | ICD-10-CM | POA: Insufficient documentation

## 2021-02-11 DIAGNOSIS — O219 Vomiting of pregnancy, unspecified: Secondary | ICD-10-CM | POA: Insufficient documentation

## 2021-02-11 DIAGNOSIS — Z20822 Contact with and (suspected) exposure to covid-19: Secondary | ICD-10-CM | POA: Diagnosis not present

## 2021-02-11 DIAGNOSIS — O23591 Infection of other part of genital tract in pregnancy, first trimester: Secondary | ICD-10-CM | POA: Diagnosis not present

## 2021-02-11 DIAGNOSIS — R109 Unspecified abdominal pain: Secondary | ICD-10-CM | POA: Insufficient documentation

## 2021-02-11 DIAGNOSIS — O26891 Other specified pregnancy related conditions, first trimester: Secondary | ICD-10-CM | POA: Insufficient documentation

## 2021-02-11 DIAGNOSIS — O2311 Infections of bladder in pregnancy, first trimester: Secondary | ICD-10-CM | POA: Diagnosis not present

## 2021-02-11 DIAGNOSIS — Z87891 Personal history of nicotine dependence: Secondary | ICD-10-CM | POA: Insufficient documentation

## 2021-02-11 DIAGNOSIS — J45909 Unspecified asthma, uncomplicated: Secondary | ICD-10-CM | POA: Diagnosis not present

## 2021-02-11 DIAGNOSIS — R112 Nausea with vomiting, unspecified: Secondary | ICD-10-CM

## 2021-02-11 LAB — URINALYSIS, ROUTINE W REFLEX MICROSCOPIC
Bilirubin Urine: NEGATIVE
Glucose, UA: NEGATIVE mg/dL
Hgb urine dipstick: NEGATIVE
Ketones, ur: 20 mg/dL — AB
Nitrite: POSITIVE — AB
Protein, ur: NEGATIVE mg/dL
Specific Gravity, Urine: 1.024 (ref 1.005–1.030)
pH: 6 (ref 5.0–8.0)

## 2021-02-11 LAB — COMPREHENSIVE METABOLIC PANEL
ALT: 9 U/L (ref 0–44)
AST: 18 U/L (ref 15–41)
Albumin: 4 g/dL (ref 3.5–5.0)
Alkaline Phosphatase: 44 U/L (ref 38–126)
Anion gap: 6 (ref 5–15)
BUN: 11 mg/dL (ref 6–20)
CO2: 24 mmol/L (ref 22–32)
Calcium: 9.5 mg/dL (ref 8.9–10.3)
Chloride: 102 mmol/L (ref 98–111)
Creatinine, Ser: 0.65 mg/dL (ref 0.44–1.00)
GFR, Estimated: >60 mL/min (ref >60)
Glucose, Bld: 86 mg/dL (ref 70–99)
Potassium: 3.9 mmol/L (ref 3.5–5.1)
Sodium: 132 mmol/L — ABNORMAL LOW (ref 135–145)
Total Bilirubin: 0.7 mg/dL (ref 0.3–1.2)
Total Protein: 8.5 g/dL — ABNORMAL HIGH (ref 6.5–8.1)

## 2021-02-11 LAB — CBC WITH DIFFERENTIAL/PLATELET
Abs Immature Granulocytes: 0.01 10*3/uL (ref 0.00–0.07)
Basophils Absolute: 0 10*3/uL (ref 0.0–0.1)
Basophils Relative: 0 %
Eosinophils Absolute: 0 10*3/uL (ref 0.0–0.5)
Eosinophils Relative: 0 %
HCT: 35.4 % — ABNORMAL LOW (ref 36.0–46.0)
Hemoglobin: 10.6 g/dL — ABNORMAL LOW (ref 12.0–15.0)
Immature Granulocytes: 0 %
Lymphocytes Relative: 39 %
Lymphs Abs: 1.4 10*3/uL (ref 0.7–4.0)
MCH: 21.2 pg — ABNORMAL LOW (ref 26.0–34.0)
MCHC: 29.9 g/dL — ABNORMAL LOW (ref 30.0–36.0)
MCV: 70.8 fL — ABNORMAL LOW (ref 80.0–100.0)
Monocytes Absolute: 0.3 10*3/uL (ref 0.1–1.0)
Monocytes Relative: 8 %
Neutro Abs: 1.8 10*3/uL (ref 1.7–7.7)
Neutrophils Relative %: 53 %
Platelets: 173 10*3/uL (ref 150–400)
RBC: 5 MIL/uL (ref 3.87–5.11)
RDW: 14.8 % (ref 11.5–15.5)
WBC: 3.5 10*3/uL — ABNORMAL LOW (ref 4.0–10.5)
nRBC: 0 % (ref 0.0–0.2)

## 2021-02-11 LAB — RESP PANEL BY RT-PCR (FLU A&B, COVID) ARPGX2
Influenza A by PCR: NEGATIVE
Influenza B by PCR: NEGATIVE
SARS Coronavirus 2 by RT PCR: NEGATIVE

## 2021-02-11 LAB — LIPASE, BLOOD: Lipase: 52 U/L — ABNORMAL HIGH (ref 11–51)

## 2021-02-11 MED ORDER — CEPHALEXIN 500 MG PO CAPS: 500.0000 mg | ORAL_CAPSULE | Freq: Four times a day (QID) | ORAL | 0 refills | Status: AC

## 2021-02-11 MED ORDER — SODIUM CHLORIDE 0.9 % IV BOLUS
1000.0000 mL | Freq: Once | INTRAVENOUS | Status: AC
  Administered 2021-02-11: 1000 mL via INTRAVENOUS

## 2021-02-11 MED ORDER — ONDANSETRON HCL 4 MG/2ML IJ SOLN
4.0000 mg | Freq: Once | INTRAMUSCULAR | Status: AC
  Administered 2021-02-11: 4 mg via INTRAVENOUS
  Filled 2021-02-11: qty 2

## 2021-02-11 NOTE — ED Provider Notes (Signed)
[facility]-EMERGENCY DEPT Provider Note   CSN: [id number] Arrival date & time: 02/11/21  1117     History No chief complaint on file.   Laura [name] is a 20 y.o. female with a past medical history significant for anemia and asthma who presents to the ED due to nausea and vomiting x3 days.  Patient admits to numerous episodes of nonbloody, nonbilious emesis associated with abdominal cramping.  Patient is currently [redacted]w[redacted]d pregnant with her 3rd pregnancy.  She has been taking Zofran and Diclegis with no relief.  Patient admits to lower abdominal cramping associated with vaginal discharge. She had a negative STD panel last week and has not been sexually active since per patient. Patient notes she was diagnosed with a UTI last week; however has been unable to pick up her medication due to transportation issues. Patient states her aunt tested positive for COVID yesterday. Patient denies cough, shortness of breath, sore throat, and rhinorrhea.  She notes she has been unable to tolerate p.o. for the past 3 days.  Nausea and vomiting associated with dizziness upon standing.  She is followed by Dr. Shawnie Pons with OB/GYN.  History obtained from patient and past medical records. No interpreter used during encounter.      Past Medical History:  Diagnosis Date  . Anemia   . Asthma   . Genital herpes   . Ovarian cyst     There are no problems to display for this patient.   Past Surgical History:  Procedure Laterality Date  . NO PAST SURGERIES    . OVARIAN CYST REMOVAL       OB History    Gravida  3   Para  1   Term  1   Preterm      AB      Living  1     SAB      IAB      Ectopic      Multiple      Live Births  1           Family History  Problem Relation Age of Onset  . Epilepsy Father     Social History   Tobacco Use  . Smoking status: Former Smoker    Types: Cigarettes, Cigars    Quit date: 10/07/2018    Years since quitting: 2.3  .  Smokeless tobacco: Never Used  Vaping Use  . Vaping Use: Former  Substance Use Topics  . Alcohol use: No  . Drug use: No    Home Medications Prior to Admission medications   Medication Sig Start Date End Date Taking? Authorizing Provider  cephALEXin (KEFLEX) 500 MG capsule Take 1 capsule (500 mg total) by mouth 4 (four) times daily for 5 days. 02/11/21 02/16/21 Yes Shravan Salahuddin, Merla Riches, PA-C  albuterol (VENTOLIN HFA) 108 (90 Base) MCG/ACT inhaler Inhale 1-2 puffs into the lungs every 6 (six) hours as needed for wheezing or shortness of breath.    [provider]  amoxicillin (AMOXIL) 500 MG tablet Take 500 mg by mouth 3 (three) times daily. 04/26/20   [provider]  cefadroxil (DURICEF) 500 MG capsule Take 1 capsule (500 mg total) by mouth 2 (two) times daily. Patient not taking: Reported on 05/06/2020 02/26/20   Gerrit Heck, CNM  ibuprofen (ADVIL) 600 MG tablet Take 1 tablet (600 mg total) by mouth every 6 (six) hours as needed. 11/09/20   Lorelee New, PA-C  Prenatal Vit-Fe Fumarate-FA (PRENATAL COMPLETE) 14-0.4 MG TABS  Take 1 tablet daily Patient not taking: Reported on 05/06/2020 11/26/19   Roxy Horseman, PA-C  sertraline (ZOLOFT) 25 MG tablet Take 25 mg by mouth daily. 05/01/20   [provider]  valACYclovir (VALTREX) 500 MG tablet Take 500 mg by mouth 2 (two) times daily as needed (outbreak).  07/27/18   [provider]    Allergies    Patient has no known allergies.  Review of Systems   Review of Systems  Constitutional: Negative for chills and fever.  Respiratory: Negative for cough and shortness of breath.   Cardiovascular: Negative for chest pain.  Gastrointestinal: Positive for abdominal pain, nausea and vomiting. Negative for diarrhea.  Genitourinary: Positive for vaginal discharge. Negative for dysuria.  All other systems reviewed and are negative.   Physical Exam Updated Vital Signs BP 136/90   Pulse 70   Temp 98.7 F (37.1 C)  (Oral)   Resp 18   Ht 5\' 3"  (1.6 m)   Wt 57.2 kg   LMP  (LMP Unknown)   SpO2 100%   BMI 22.32 kg/m   Physical Exam Vitals and nursing note reviewed.  Constitutional:      General: She is not in acute distress.    Appearance: She is not ill-appearing.  HENT:     Head: Normocephalic.  Eyes:     Pupils: Pupils are equal, round, and reactive to light.  Cardiovascular:     Rate and Rhythm: Normal rate and regular rhythm.     Pulses: Normal pulses.     Heart sounds: Normal heart sounds. No murmur heard. No friction rub. No gallop.   Pulmonary:     Effort: Pulmonary effort is normal.     Breath sounds: Normal breath sounds.  Abdominal:     General: Abdomen is flat. There is no distension.     Palpations: Abdomen is soft.     Tenderness: There is no abdominal tenderness. There is no guarding or rebound.     Comments: Abdomen soft, nondistended, nontender to palpation in all quadrants without guarding or peritoneal signs. No rebound.   Musculoskeletal:        General: Normal range of motion.     Cervical back: Neck supple.  Skin:    General: Skin is warm and dry.  Neurological:     General: No focal deficit present.     Mental Status: She is alert.  Psychiatric:        Mood and Affect: Mood normal.        Behavior: Behavior normal.     ED Results / Procedures / Treatments   Labs (all labs ordered are listed, but only abnormal results are displayed) Labs Reviewed  CBC WITH DIFFERENTIAL/PLATELET - Abnormal; Notable for the following components:      Result Value   WBC 3.5 (*)    Hemoglobin 10.6 (*)    HCT 35.4 (*)    MCV 70.8 (*)    MCH 21.2 (*)    MCHC 29.9 (*)    All other components within normal limits  COMPREHENSIVE METABOLIC PANEL - Abnormal; Notable for the following components:   Sodium 132 (*)    Total Protein 8.5 (*)    All other components within normal limits  LIPASE, BLOOD - Abnormal; Notable for the following components:   Lipase 52 (*)    All other  components within normal limits  URINALYSIS, ROUTINE W REFLEX MICROSCOPIC - Abnormal; Notable for the following components:   APPearance HAZY (*)  Ketones, ur 20 (*)    Nitrite POSITIVE (*)    Leukocytes,Ua LARGE (*)    Bacteria, UA RARE (*)    All other components within normal limits  RESP PANEL BY RT-PCR (FLU A&B, COVID) ARPGX2  URINE CULTURE    EKG None  Radiology No results found.  Procedures Procedures   Medications Ordered in ED Medications  sodium chloride 0.9 % bolus 1,000 mL (0 mLs Intravenous Stopped 02/11/21 1530)  ondansetron (ZOFRAN) injection 4 mg (4 mg Intravenous Given 02/11/21 1259)    ED Course  I have reviewed the triage vital signs and the nursing notes.  Pertinent labs & imaging results that were available during my care of the patient were reviewed by me and considered in my medical decision making (see chart for details).  Clinical Course as of 02/11/21 1549  Mon Feb 11, 2021  1436 Lipase(!): 52 [CA]  1436 SARS Coronavirus 2 by RT PCR: NEGATIVE [CA]  1436 Sodium(!): 132 [CA]  1436 Lipase(!): 52 [CA]  1516 Nitrite(!): POSITIVE [CA]  1516 Leukocytes,Ua(!): LARGE [CA]    Clinical Course User Index [CA] Mannie Stabile, PA-C   MDM Rules/Calculators/A&P                         20 year old at [redacted]w[redacted]d gestation presents to the ED due to nausea, vomiting, and abdominal cramping. She was recently diagnosed with a UTI by her OBGYN last week; however has not started her medications. No vaginal bleeding or fluid. Negative STD panel last week. Upon arrival, stable vitals.  Patient is afebrile, not tachycardic or hypoxic.  Patient in no acute distress and non-ill-appearing.  Physical exam reassuring.  Abdomen soft, nondistended, nontender.  Negative CVA tenderness bilaterally.  Low suspicion for pyelonephritis.  Routine labs ordered to rule out electrolyte abnormalities given emesis.  Ultrasound given lower abdominal cramping. COVID test to rule out infection  due positive exposure. IVFs and Zofran given.   CBC significant for leukopenia at 3.5 and anemia with hemoglobin at 10.6. CMP significant for hyponatremia at 132. Normal renal function. No major electrolyte derangements. UA significant for large leukocytes and nitrites. Will treat with antibiotic for acute cystitis. COVID/influenza negative. Lipase mildly elevated at 52.   Patient handed off to [facility], PA-C at shift change pending ultrasound. If normal, patient may be treated with acute cystitis with OBGYN follow-up this week.  Final Clinical Impression(s) / ED Diagnoses Final diagnoses:  Acute cystitis in pregnancy, antepartum, first trimester  [redacted] weeks gestation of pregnancy  Non-intractable vomiting with nausea, unspecified vomiting type    Rx / DC Orders ED Discharge Orders         Ordered    cephALEXin (KEFLEX) 500 MG capsule  4 times daily        02/11/21 1546           Jesusita Oka 02/11/21 1549    Bethann Berkshire, MD 02/13/21 1006

## 2021-02-11 NOTE — ED Triage Notes (Signed)
Patient reports being unable to eat or drink without emesis occurring after. She is currently 2 months pregnant. She also reports dizziness on occasion. She has been laying in bed the past couple of days because activity causes her to feel dizzy.

## 2021-02-11 NOTE — ED Provider Notes (Signed)
5:33 PM signout from [place] PA-C at shift change.  Patient here with nausea and vomiting in early pregnancy.  She has been taking home meds without improvement.  Currently awaiting ultrasound due to abdominal cramping that she has had with the vomiting.  Ultrasound shows a small subchorionic hemorrhage, without any other concerning findings.  Patient has not had any vaginal bleeding.  She has had untreated.  No fevers or signs of pyonephritis today.  She has received IV fluids.  No further vomiting.  We discussed frequent small meals and fluids, continued use of OTC meds for nausea and vomiting.  Encouraged her to fill the treatment for UTI.  If she develops worsening pain, vaginal bleeding or, she should go to [facility].  BP (!) 144/98   Pulse 70   Temp 98.7 F (37.1 C) (Oral)   Resp 18   Ht 5\' 3"  (1.6 m)   Wt 57.2 kg   LMP  (LMP Unknown)   SpO2 100%   BMI 22.32 kg/m     , PA-C 02/11/21 1735    02/13/21, MD 02/11/21 2348

## 2021-02-11 NOTE — Discharge Instructions (Addendum)
As discussed, your urine showed you have a UTI. I am sending you home with antibiotics. Take as prescribed and finish all antibiotics. Follow-up with OBGYN later this week for re-evaluation. Return to the ER for new or worsening symptoms.   Your ultrasound did not show anything concerning. There was a very small subchorionic hemorrhage (bleeding at the insertion of the placenta and the uterus) which can be seen in early pregnancy. If you developed any vaginal bleeding, you should go to the [facility] for evaluation.   Continue to take your nausea medication as prescribed by your OBGYN.

## 2021-02-14 LAB — URINE CULTURE: Culture: >=100000 — AB

## 2021-02-15 ENCOUNTER — Telehealth (HOSPITAL_BASED_OUTPATIENT_CLINIC_OR_DEPARTMENT_OTHER): Payer: Self-pay | Admitting: Emergency Medicine

## 2021-02-15 NOTE — Telephone Encounter (Signed)
Post ED Visit - Positive Culture Follow-up  Culture report reviewed by antimicrobial stewardship pharmacist: [facility] []  , Pharm.D. []  Enzo Bi, Pharm.D., BCPS AQ-ID []  , Pharm.D., BCPS []  Celedonio Miyamoto, .D., BCPS []  [place], .D., BCPS, AAHIVP []  Georgina Pillion, Pharm.D., BCPS, AAHIVP []  [address], PharmD, BCPS []  , PharmD, BCPS []  [place], PharmD, BCPS []  [place], PharmD []  , PharmD, BCPS []  Estella Husk, PharmD  Pharmacy Team []  Lysle Pearl, PharmD []  , PharmD []  Phillips Climes, PharmD []  , Rph []  Agapito Games) , PharmD []  Verlan Friends, PharmD []  , PharmD []  Mervyn Gay, PharmD []  , PharmD []  Vinnie Level, PharmD []  [facility], PharmD []  , PharmD [x]  Len Childs, PharmD   Positive urine culture Treated with Cephalexin, organism sensitive to the same and no further patient follow-up is required at this time.  Laura [name] 02/15/2021, 10:14 AM

## 2021-02-19 ENCOUNTER — Inpatient Hospital Stay (HOSPITAL_COMMUNITY)
Admission: EM | Admit: 2021-02-19 | Discharge: 2021-02-19 | Disposition: A | Discharge status: Home / Self Care | Payer: Medicaid Other | Attending: Obstetrics & Gynecology | Admitting: Obstetrics & Gynecology

## 2021-02-19 ENCOUNTER — Other Ambulatory Visit: Payer: Self-pay

## 2021-02-19 ENCOUNTER — Encounter (HOSPITAL_COMMUNITY): Payer: Self-pay | Admitting: Obstetrics & Gynecology

## 2021-02-19 DIAGNOSIS — O26891 Other specified pregnancy related conditions, first trimester: Secondary | ICD-10-CM | POA: Diagnosis not present

## 2021-02-19 DIAGNOSIS — D61818 Other pancytopenia: Secondary | ICD-10-CM | POA: Insufficient documentation

## 2021-02-19 DIAGNOSIS — Z87891 Personal history of nicotine dependence: Secondary | ICD-10-CM | POA: Diagnosis not present

## 2021-02-19 DIAGNOSIS — Z3A08 8 weeks gestation of pregnancy: Secondary | ICD-10-CM | POA: Diagnosis not present

## 2021-02-19 DIAGNOSIS — R109 Unspecified abdominal pain: Secondary | ICD-10-CM | POA: Insufficient documentation

## 2021-02-19 DIAGNOSIS — O99011 Anemia complicating pregnancy, first trimester: Secondary | ICD-10-CM | POA: Diagnosis not present

## 2021-02-19 DIAGNOSIS — O219 Vomiting of pregnancy, unspecified: Secondary | ICD-10-CM | POA: Insufficient documentation

## 2021-02-19 LAB — URINALYSIS, ROUTINE W REFLEX MICROSCOPIC
Bilirubin Urine: NEGATIVE
Glucose, UA: NEGATIVE mg/dL
Hgb urine dipstick: NEGATIVE
Ketones, ur: NEGATIVE mg/dL
Nitrite: NEGATIVE
Protein, ur: NEGATIVE mg/dL
Specific Gravity, Urine: 1.024 (ref 1.005–1.030)
WBC, UA: >50 WBC/hpf — ABNORMAL HIGH (ref 0–5)
pH: 6 (ref 5.0–8.0)

## 2021-02-19 LAB — CBC WITH DIFFERENTIAL/PLATELET
Abs Immature Granulocytes: 0.01 10*3/uL (ref 0.00–0.07)
Basophils Absolute: 0 10*3/uL (ref 0.0–0.1)
Basophils Relative: 0 %
Eosinophils Absolute: 0 10*3/uL (ref 0.0–0.5)
Eosinophils Relative: 0 %
HCT: 32.1 % — ABNORMAL LOW (ref 36.0–46.0)
Hemoglobin: 9.8 g/dL — ABNORMAL LOW (ref 12.0–15.0)
Immature Granulocytes: 0 %
Lymphocytes Relative: 39 %
Lymphs Abs: 1.4 10*3/uL (ref 0.7–4.0)
MCH: 21.7 pg — ABNORMAL LOW (ref 26.0–34.0)
MCHC: 30.5 g/dL (ref 30.0–36.0)
MCV: 71 fL — ABNORMAL LOW (ref 80.0–100.0)
Monocytes Absolute: 0.3 10*3/uL (ref 0.1–1.0)
Monocytes Relative: 9 %
Neutro Abs: 1.8 10*3/uL (ref 1.7–7.7)
Neutrophils Relative %: 52 %
Platelets: 143 10*3/uL — ABNORMAL LOW (ref 150–400)
RBC: 4.52 MIL/uL (ref 3.87–5.11)
RDW: 14.6 % (ref 11.5–15.5)
WBC: 3.6 10*3/uL — ABNORMAL LOW (ref 4.0–10.5)
nRBC: 0 % (ref 0.0–0.2)

## 2021-02-19 LAB — COMPREHENSIVE METABOLIC PANEL
ALT: 9 U/L (ref 0–44)
AST: 17 U/L (ref 15–41)
Albumin: 3.4 g/dL — ABNORMAL LOW (ref 3.5–5.0)
Alkaline Phosphatase: 37 U/L — ABNORMAL LOW (ref 38–126)
Anion gap: 8 (ref 5–15)
BUN: 7 mg/dL (ref 6–20)
CO2: 24 mmol/L (ref 22–32)
Calcium: 9.1 mg/dL (ref 8.9–10.3)
Chloride: 102 mmol/L (ref 98–111)
Creatinine, Ser: 0.63 mg/dL (ref 0.44–1.00)
GFR, Estimated: >60 mL/min (ref >60)
Glucose, Bld: 80 mg/dL (ref 70–99)
Potassium: 3.7 mmol/L (ref 3.5–5.1)
Sodium: 134 mmol/L — ABNORMAL LOW (ref 135–145)
Total Bilirubin: 0.6 mg/dL (ref 0.3–1.2)
Total Protein: 6.9 g/dL (ref 6.5–8.1)

## 2021-02-19 LAB — CBC
HCT: 30.4 % — ABNORMAL LOW (ref 36.0–46.0)
Hemoglobin: 9.5 g/dL — ABNORMAL LOW (ref 12.0–15.0)
MCH: 21.7 pg — ABNORMAL LOW (ref 26.0–34.0)
MCHC: 31.3 g/dL (ref 30.0–36.0)
MCV: 69.6 fL — ABNORMAL LOW (ref 80.0–100.0)
Platelets: 142 10*3/uL — ABNORMAL LOW (ref 150–400)
RBC: 4.37 MIL/uL (ref 3.87–5.11)
RDW: 14.5 % (ref 11.5–15.5)
WBC: 4.1 10*3/uL (ref 4.0–10.5)
nRBC: 0 % (ref 0.0–0.2)

## 2021-02-19 MED ORDER — LACTATED RINGERS IV BOLUS
1000.0000 mL | Freq: Once | INTRAVENOUS | Status: AC
  Administered 2021-02-19: 1000 mL via INTRAVENOUS

## 2021-02-19 MED ORDER — METOCLOPRAMIDE HCL 10 MG PO TABS: 10.0000 mg | ORAL_TABLET | Freq: Three times a day (TID) | ORAL | 1 refills | Status: DC

## 2021-02-19 MED ORDER — METOCLOPRAMIDE HCL 5 MG/ML IJ SOLN
10.0000 mg | Freq: Once | INTRAMUSCULAR | Status: AC
  Administered 2021-02-19: 10 mg via INTRAVENOUS
  Filled 2021-02-19: qty 2

## 2021-02-19 MED ORDER — FAMOTIDINE 20 MG PO TABS: 20.0000 mg | ORAL_TABLET | Freq: Every day | ORAL | 1 refills | Status: DC

## 2021-02-19 MED ORDER — FAMOTIDINE IN NACL 20-0.9 MG/50ML-% IV SOLN
20.0000 mg | Freq: Once | INTRAVENOUS | Status: AC
  Administered 2021-02-19: 20 mg via INTRAVENOUS
  Filled 2021-02-19: qty 50

## 2021-02-19 NOTE — MAU Provider Note (Addendum)
History     CSN: [id number]  Arrival date and time: 02/19/21 1215   None     Chief Complaint  Patient presents with  . Abdominal Pain  . Nausea  . Emesis   [name] is a 20 y.o. G3P2002 at [redacted]w[redacted]d who presents to MAU for nausea and vomiting which first started around 02/01/2021. Patient reports since that time is has been "bad" and is getting worse and is worse than in her other pregnancies. Patient reports vomiting 4-5 times daily. Patient reports she is able to eat and drink chicken broth, applesauce. Patient reports she cannot eat meat because of the smell and reports she can drink water, ginger ale, Gatorade, but vomits after an hour. Patient reports for treatment she was given Zofran and Diclegis. Patient reports there is another medication that she was prescribed yesterday by her OB and she took it this morning, but on clarification, pt shows provider that the name of the medication is cephalexin, which patient was informed is not for nausea. Patient also was prescribed phenergan, but she does not know if she took the promethazine or cephalexin this morning.  Patient reports she is not taking the Zofran scheduled, but is only taking it as she feels sick, sometimes up to 5 times daily. Patient reports taking Diclegis 2 tablets at night and one during the day around 12pm. Patient reports she goes to bed around 10PM, which is when she takes the Diclegis. Patient reports she is sleeping a lot more than normal because she feels weak and tired.  Pt denies VB, LOF, ctx, decreased FM, vaginal discharge/odor/itching. Pt denies abdominal pain, constipation, diarrhea, or urinary problems. Pt denies fever, chills, fatigue, sweating or changes in appetite. Pt denies SOB or chest pain. Pt denies dizziness, HA, light-headedness, weakness.  Problems this pregnancy include: none. Allergies? none Current medications/supplements? Zofran, Diclegis, Phenergan, cephalexin Prenatal care provider?  Dr. Shawnie Pons, next appt 02/20/2021   OB History    Gravida  3   Para  2   Term  2   Preterm      AB      Living  2     SAB      IAB      Ectopic      Multiple      Live Births  2           Past Medical History:  Diagnosis Date  . Anemia   . Asthma   . Genital herpes   . Ovarian cyst     Past Surgical History:  Procedure Laterality Date  . NO PAST SURGERIES    . OVARIAN CYST REMOVAL      Family History  Problem Relation Age of Onset  . Epilepsy Father     Social History   Tobacco Use  . Smoking status: Former Smoker    Types: Cigarettes, Cigars    Quit date: 10/07/2018    Years since quitting: 2.3  . Smokeless tobacco: Never Used  Vaping Use  . Vaping Use: Former  Substance Use Topics  . Alcohol use: No  . Drug use: No    Allergies: No Known Allergies  No medications prior to admission.    Review of Systems  Constitutional: Negative for chills, diaphoresis, fatigue and fever.  Eyes: Negative for visual disturbance.  Respiratory: Negative for shortness of breath.   Cardiovascular: Negative for chest pain.  Gastrointestinal: Positive for nausea and vomiting. Negative for abdominal pain, constipation and diarrhea.  Genitourinary:  Negative for dysuria, flank pain, frequency, pelvic pain, urgency, vaginal bleeding and vaginal discharge.  Neurological: Negative for dizziness, weakness, light-headedness and headaches.   Physical Exam   Blood pressure 123/88, pulse 69, temperature 98.4 F (36.9 C), temperature source Oral, resp. rate 16, height 5\' 3"  (1.6 m), weight 57.1 kg, SpO2 100 %, unknown if currently breastfeeding.  Patient Vitals for the past 24 hrs:  BP Temp Temp src Pulse Resp SpO2 Height Weight  02/19/21 2027 123/88 -- -- 69 -- -- -- --  02/19/21 1340 126/80 98.4 F (36.9 C) Oral 76 16 100 % -- --  02/19/21 1335 -- -- -- -- -- -- 5\' 3"  (1.6 m) 57.1 kg  02/19/21 1238 116/85 98.6 F (37 C) Oral 75 14 100 % -- --   Physical  Exam Vitals and nursing note reviewed.  Constitutional:      General: She is not in acute distress.    Appearance: Normal appearance. She is not ill-appearing, toxic-appearing or diaphoretic.  HENT:     Head: Normocephalic and atraumatic.  Pulmonary:     Effort: Pulmonary effort is normal.  Neurological:     Mental Status: She is alert and oriented to person, place, and time.  Psychiatric:        Mood and Affect: Mood normal.        Behavior: Behavior normal.        Thought Content: Thought content normal.        Judgment: Judgment normal.    Results for orders placed or performed during the hospital encounter of 02/19/21 (from the past 24 hour(s))  Urinalysis, Routine w reflex microscopic Urine, Clean Catch     Status: Abnormal   Collection Time: 02/19/21  2:01 PM  Result Value Ref Range   Color, Urine YELLOW YELLOW   APPearance CLOUDY (A) CLEAR   Specific Gravity, Urine 1.024 1.005 - 1.030   pH 6.0 5.0 - 8.0   Glucose, UA NEGATIVE NEGATIVE mg/dL   Hgb urine dipstick NEGATIVE NEGATIVE   Bilirubin Urine NEGATIVE NEGATIVE   Ketones, ur NEGATIVE NEGATIVE mg/dL   Protein, ur NEGATIVE NEGATIVE mg/dL   Nitrite NEGATIVE NEGATIVE   Leukocytes,Ua LARGE (A) NEGATIVE   RBC / HPF 0-5 0 - 5 RBC/hpf   WBC, UA >50 (H) 0 - 5 WBC/hpf   Bacteria, UA FEW (A) NONE SEEN   Squamous Epithelial / LPF 11-20 0 - 5   Mucus PRESENT   CBC with Differential/Platelet     Status: Abnormal   Collection Time: 02/19/21  4:02 PM  Result Value Ref Range   WBC 3.6 (L) 4.0 - 10.5 K/uL   RBC 4.52 3.87 - 5.11 MIL/uL   Hemoglobin 9.8 (L) 12.0 - 15.0 g/dL   HCT 02/21/21 (L) 02/21/21 - 38.7 %   MCV 71.0 (L) 80.0 - 100.0 fL   MCH 21.7 (L) 26.0 - 34.0 pg   MCHC 30.5 30.0 - 36.0 g/dL   RDW 56.4 33.2 - 95.1 %   Platelets 143 (L) 150 - 400 K/uL   nRBC 0.0 0.0 - 0.2 %   Neutrophils Relative % 52 %   Neutro Abs 1.8 1.7 - 7.7 K/uL   Lymphocytes Relative 39 %   Lymphs Abs 1.4 0.7 - 4.0 K/uL   Monocytes Relative 9 %    Monocytes Absolute 0.3 0.1 - 1.0 K/uL   Eosinophils Relative 0 %   Eosinophils Absolute 0.0 0.0 - 0.5 K/uL   Basophils Relative 0 %   Basophils  Absolute 0.0 0.0 - 0.1 K/uL   Immature Granulocytes 0 %   Abs Immature Granulocytes 0.01 0.00 - 0.07 K/uL  Comprehensive metabolic panel     Status: Abnormal   Collection Time: 02/19/21  4:02 PM  Result Value Ref Range   Sodium 134 (L) 135 - 145 mmol/L   Potassium 3.7 3.5 - 5.1 mmol/L   Chloride 102 98 - 111 mmol/L   CO2 24 22 - 32 mmol/L   Glucose, Bld 80 70 - 99 mg/dL   BUN 7 6 - 20 mg/dL   Creatinine, Ser 4.090.63 0.44 - 1.00 mg/dL   Calcium 9.1 8.9 - 81.110.3 mg/dL   Total Protein 6.9 6.5 - 8.1 g/dL   Albumin 3.4 (L) 3.5 - 5.0 g/dL   AST 17 15 - 41 U/L   ALT 9 0 - 44 U/L   Alkaline Phosphatase 37 (L) 38 - 126 U/L   Total Bilirubin 0.6 0.3 - 1.2 mg/dL   GFR, Estimated >91>60 >47>60 mL/min   Anion gap 8 5 - 15  CBC     Status: Abnormal   Collection Time: 02/19/21  7:41 PM  Result Value Ref Range   WBC 4.1 4.0 - 10.5 K/uL   RBC 4.37 3.87 - 5.11 MIL/uL   Hemoglobin 9.5 (L) 12.0 - 15.0 g/dL   HCT 82.930.4 (L) 56.236.0 - 13.046.0 %   MCV 69.6 (L) 80.0 - 100.0 fL   MCH 21.7 (L) 26.0 - 34.0 pg   MCHC 31.3 30.0 - 36.0 g/dL   RDW 86.514.5 78.411.5 - 69.615.5 %   Platelets 142 (L) 150 - 400 K/uL   nRBC 0.0 0.0 - 0.2 %   CT Angio Chest PE W and/or Wo Contrast  Result Date: 01/27/2021 CLINICAL DATA:  Anterior chest pain with deep breath, shortness of breath. Four weeks pregnant. EXAM: CT ANGIOGRAPHY CHEST WITH CONTRAST TECHNIQUE: Multidetector CT imaging of the chest was performed using the standard protocol during bolus administration of intravenous contrast. Multiplanar CT image reconstructions and MIPs were obtained to evaluate the vascular anatomy. CONTRAST:  75mL OMNIPAQUE IOHEXOL 350 MG/ML SOLN COMPARISON:  None. FINDINGS: Cardiovascular: Some of the most peripheral segmental and subsegmental pulmonary artery branches cannot be definitively characterized due to patient  breathing motion artifact, however, there is no pulmonary embolism identified within the main, lobar or central segmental pulmonary arteries bilaterally. No thoracic aortic aneurysm or evidence of aortic dissection. No pericardial effusion. Mediastinum/Nodes: Normal residual thymic tissue within the anterior mediastinum. No mass or enlarged lymph nodes are seen within the mediastinum or perihilar regions. Esophagus appears normal. Trachea and central bronchi are unremarkable. Lungs/Pleura: Lungs are clear.  No pleural effusion or pneumothorax. Upper Abdomen: Limited images of the upper abdomen are unremarkable. Musculoskeletal: No chest wall abnormality. No acute or significant osseous findings. Review of the MIP images confirms the above findings. IMPRESSION: Negative exam. No pulmonary embolism seen, with mild study limitations detailed above. Lungs are clear. Electronically Signed   By: Bary RichardStan  Maynard M.D.   On: 01/27/2021 16:55   US OB Comp < 14 Wks  Result Date: 02/11/2021 CLINICAL DATA:  Abdominal pain x3 days. EXAM: OBSTETRIC <14 WK ULTRASOUND TECHNIQUE: Transabdominal ultrasound was performed for evaluation of the gestation as well as the maternal uterus and adnexal regions. COMPARISON:  None. FINDINGS: Intrauterine gestational sac: Single Yolk sac:  Visualized. Embryo:  Visualized. Cardiac Activity: Visualized. Heart Rate: 170 bpm CRL:   7.43 mm   7 w 5 d  [place] EDC: September 25, 2021 Subchorionic hemorrhage:  Small Maternal uterus/adnexae: No pelvic free fluid is seen. IMPRESSION: Single, viable intrauterine pregnancy at approximately 7 weeks and 5 days gestation by ultrasound evaluation. Electronically Signed   By: Aram Candela M.D.   On: 02/11/2021 16:59   US OB Transvaginal  Result Date: 02/11/2021 CLINICAL DATA:  Abdominal pain x3 days. EXAM: TRANSVAGINAL OB ULTRASOUND TECHNIQUE: Transvaginal ultrasound was performed for complete evaluation of the gestation as well as the  maternal uterus, adnexal regions, and pelvic cul-de-sac. COMPARISON:  None. FINDINGS: Intrauterine gestational sac: Single Yolk sac:  Visualized. Embryo:  Visualized. Cardiac Activity: Visualized. Heart Rate: 170 bpm CRL:   7.43 mm   7 w 5 d                  [place] EDC: September 25, 2021 Subchorionic hemorrhage:  Small Maternal uterus/adnexae: No pelvic free fluid is seen. IMPRESSION: Single, viable intrauterine pregnancy at approximately 7 weeks and 5 days gestation by ultrasound evaluation. Electronically Signed   By: Aram Candela M.D.   On: 02/11/2021 16:55   DG Chest Port 1 View  Result Date: 01/27/2021 CLINICAL DATA:  Pleuritic chest pain EXAM: PORTABLE CHEST 1 VIEW COMPARISON:  None. FINDINGS: The heart size and mediastinal contours are within normal limits. Both lungs are clear. The visualized skeletal structures are unremarkable. IMPRESSION: No active disease. Electronically Signed   By: Elige Ko   On: 01/27/2021 15:14    MAU Course  Procedures  MDM -N/V in pregnancy -IUP confirmed on [place] 02/11/2021 -weight today 57.1kg, 1kg weight loss since 01/27/2021 -Discussed the risk of using Zofran in early pregnancy: Available data suggest that use of Zofran (Ondansetron) in early pregnancy is not associated with a high risk of congenital malformations but a small absolute increase in risk of CV malformations (especially septum defects) and cleft palate may exist. The patient was warned of SE of constipation. Pt declines continued use of Zofran at this time. -UA: cloudy/lg leuks/few bacteria, sending urine for culture -CBC w/Diff: WBCs 3.6, hgb 9.8 (down from 10.6 8d ago), platelets 143 -consulted with [facility] on abnormal CBC, who recommends repeating CBC in approximately , if patient continues to feel better, even with continued abnormal CBC, can be followed up outpatient with hematology -CMP: no abnormalities requiring treatment -1L LR + 20mg  Pepcid + 10mg  Reglan given, pt  reports NV now resolved -pt able to urinate after fluids -PO challenge successful -repeat CBC grossly similar, will refer to hematology -pt discharged to home in stable condition  Orders Placed This Encounter  Procedures  . Urinalysis, Routine w reflex microscopic Urine, Clean Catch    Standing Status:   Standing    Number of Occurrences:   1  . CBC with Differential/Platelet    Standing Status:   Standing    Number of Occurrences:   1  . Comprehensive metabolic panel    Standing Status:   Standing    Number of Occurrences:   1  . CBC    Standing Status:   Standing    Number of Occurrences:   1  . Ambulatory referral to Hematology / Oncology    Referral Priority:   Routine    Referral Type:   Consultation    Referral Reason:   Specialty Services Required    Requested Specialty:   Hematology    Number of Visits Requested:   1  . Insert peripheral IV    Standing Status:   Standing  Number of Occurrences:   1  . Discharge patient    Order Specific Question:   Discharge disposition    Answer:   01-Home or Self Care [1]    Order Specific Question:   Discharge patient date    Answer:   02/19/2021   Meds ordered this encounter  Medications  . lactated ringers bolus 1,000 mL  . famotidine (PEPCID) IVPB 20 mg premix  . metoCLOPramide (REGLAN) injection 10 mg  . metoCLOPramide (REGLAN) 10 MG tablet    Sig: Take 1 tablet (10 mg total) by mouth 3 (three) times daily with meals.    Dispense:  90 tablet    Refill:  1    Order Specific Question:   Supervising Provider    Answer:   Reva Bores [[id number]]  . famotidine (PEPCID) 20 MG tablet    Sig: Take 1 tablet (20 mg total) by mouth daily.    Dispense:  30 tablet    Refill:  1    Order Specific Question:   Supervising Provider    Answer:   Reva Bores [[id number]]   Assessment and Plan   1. Nausea and vomiting in pregnancy   2. [redacted] weeks gestation of pregnancy   3. Pancytopenia (HCC)     Allergies as of 02/19/2021   No Known  Allergies     Medication List    STOP taking these medications   ibuprofen 600 MG tablet Commonly known as: ADVIL     TAKE these medications   albuterol 108 (90 Base) MCG/ACT inhaler Commonly known as: VENTOLIN HFA Inhale 1-2 puffs into the lungs every 6 (six) hours as needed for wheezing or shortness of breath.   amoxicillin 500 MG tablet Commonly known as: AMOXIL Take 500 mg by mouth 3 (three) times daily.   cefadroxil 500 MG capsule Commonly known as: DURICEF Take 1 capsule (500 mg total) by mouth 2 (two) times daily.   famotidine 20 MG tablet Commonly known as: Pepcid Take 1 tablet (20 mg total) by mouth daily.   metoCLOPramide 10 MG tablet Commonly known as: REGLAN Take 1 tablet (10 mg total) by mouth 3 (three) times daily with meals.   ondansetron 8 MG tablet Commonly known as: ZOFRAN Take by mouth every 8 (eight) hours as needed for nausea or vomiting.   Prenatal Complete 14-0.4 MG Tabs Take 1 tablet daily   sertraline 25 MG tablet Commonly known as: ZOLOFT Take 25 mg by mouth daily.   valACYclovir 500 MG tablet Commonly known as: VALTREX Take 500 mg by mouth 2 (two) times daily as needed (outbreak).      -pt advised not to take Reglan with Phenergan -discussed must take medication on schedule, not PRN -RX Reglan -RX Pepcid -pt advised to keep OB appointment tomorrow -return MAU precautions given -pt discharged to home in stable condition  Joni Reining E Annaliyah Willig 02/19/2021, 8:29 PM

## 2021-02-19 NOTE — ED Provider Notes (Signed)
Emergency Medicine Provider OB Triage Evaluation Note  Laura [name] is a 20 y.o. female, G3P1001, at Unknown gestation who presents to the emergency department with complaints of nausea and vomiting.  Estimated [redacted] weeks pregnant.  Review of  Systems  Positive: Nausea, vomiting Negative: Abdominal pain, vaginal bleeding, syncope  Physical Exam  BP 116/85 (BP Location: Left Arm)   Pulse 75   Temp 98.6 F (37 C) (Oral)   Resp 14   LMP  (LMP Unknown)   SpO2 100%  General: Awake, no distress  HEENT: Atraumatic  Resp: Normal effort  Cardiac: Normal rate Abd: Nondistended, nontender  MSK: Moves all extremities without difficulty Neuro: Speech clear  Medical Decision Making  Pt evaluated for pregnancy concern and is stable for transfer to MAU. Pt is in agreement with plan for transfer.  1:15 PM Discussed with MAU APP, Joni Reining, who accepts patient in transfer.  Clinical Impression  No diagnosis found.     Anselm Pancoast, PA-C 02/19/21 1316    Cathren Laine, MD 02/21/21 1250

## 2021-02-19 NOTE — ED Triage Notes (Signed)
Pt reports here not able to eat or drink for the last week. Currently [redacted] weeks pregnant. Diagnosed with hyperemesis gravidum. VSS. NAD at present.

## 2021-02-19 NOTE — MAU Note (Signed)
Laura [name] is a 20 y.o. at [redacted]w[redacted]d here in MAU reporting: was at [facility] last week and they diagnosed her with HG. States she has been taking zofran and diclegis but is unable to keep anything down. 5 episodes of vomiting in the past 24 hours. Having some lower abdominal pain but no bleeding or discharge.  Onset of complaint: ongoing  Pain score: 5/10  Vitals:   02/19/21 1238 02/19/21 1340  BP: 116/85 126/80  Pulse: 75 76  Resp: 14 16  Temp: 98.6 F (37 C) 98.4 F (36.9 C)  SpO2: 100% 100%     Lab orders placed from triage: UA

## 2021-02-19 NOTE — Discharge Instructions (Signed)
Morning Sickness  Morning sickness is when a woman feels nauseous during pregnancy. This nauseous feeling may or may not come with vomiting. It often occurs in the morning, but it can be a problem at any time of day. Morning sickness is most common during the first trimester. In some cases, it may continue throughout pregnancy. Although morning sickness is unpleasant, it is usually harmless unless the woman develops severe and continual vomiting (hyperemesis gravidarum), a condition that requires more intense treatment. What are the causes? The exact cause of this condition is not known, but it seems to be related to normal hormonal changes that occur in pregnancy. What increases the risk? You are more likely to develop this condition if:  You experienced nausea or vomiting before your pregnancy.  You had morning sickness during a previous pregnancy.  You are pregnant with more than one baby, such as twins. What are the signs or symptoms? Symptoms of this condition include:  Nausea.  Vomiting. How is this diagnosed? This condition is usually diagnosed based on your signs and symptoms. How is this treated? In many cases, treatment is not needed for this condition. Making some changes to what you eat may help to control symptoms. Your health care provider may also prescribe or recommend:  Vitamin B6 supplements.  Anti-nausea medicines.  Ginger. Follow these instructions at home: Medicines  Take over-the-counter and prescription medicines only as told by your health care provider. Do not use any prescription, over-the-counter, or herbal medicines for morning sickness without first talking with your health care provider.  Take multivitamins before getting pregnant. This can prevent or decrease the severity of morning sickness in most women. Eating and drinking  Eat a piece of dry toast or crackers before getting out of bed in the morning.  Eat 5 or 6 small meals a day.  Eat dry  and bland foods, such as rice or a baked potato. Foods that are high in carbohydrates are often helpful.  Avoid greasy, fatty, and spicy foods.  Have someone cook for you if the smell of any food causes nausea and vomiting.  If you feel nauseous after taking prenatal vitamins, take the vitamins at night or with a snack.  Eat a protein snack between meals if you are hungry. Nuts, yogurt, and cheese are good options.  Drink fluids throughout the day.  Try ginger ale made with real ginger, ginger tea made from fresh grated ginger, or ginger candies. General instructions  Do not use any products that contain nicotine or tobacco. These products include cigarettes, chewing tobacco, and vaping devices, such as e-cigarettes. If you need help quitting, ask your health care provider.  Get an air purifier to keep the air in your house free of odors.  Get plenty of fresh air.  Try to avoid odors that trigger your nausea.  Consider trying these methods to help relieve symptoms: ? Wearing an acupressure wristband. These wristbands are often worn for seasickness. ? Acupuncture. Contact a health care provider if:  Your home remedies are not working and you need medicine.  You feel dizzy or light-headed.  You are losing weight. Get help right away if:  You have persistent and uncontrolled nausea and vomiting.  You faint.  You have severe pain in your abdomen. Summary  Morning sickness is when a woman feels nauseous during pregnancy. This nauseous feeling may or may not come with vomiting.  Morning sickness is most common during the first trimester.  It often occurs in the   morning, but it can be a problem at any time of day.  In many cases, treatment is not needed for this condition. Making some changes to what you eat may help to control symptoms. This information is not intended to replace advice given to you by your health care provider. Make sure you discuss any questions you have  with your health care provider. Document Revised: 04/30/2020 Document Reviewed: 04/09/2020 Elsevier Patient Education  2021 [place].        Hyperemesis Gravidarum Hyperemesis gravidarum is a severe form of nausea and vomiting that happens during pregnancy. Hyperemesis is worse than morning sickness. It may cause you to have nausea or vomiting all day for many days. It may keep you from eating and drinking enough food and liquids, which can lead to dehydration, malnutrition, and weight loss. Hyperemesis usually occurs during the first half (the first 20 weeks) of pregnancy. It often goes away once a woman is in her second half of pregnancy. However, sometimes hyperemesis continues through an entire pregnancy. What are the causes? The cause of this condition is not known. It may be associated with:  Changes in hormones in the body during pregnancy.  Changes in the gastrointestinal system.  Genetic or inherited conditions. What are the signs or symptoms? Symptoms of this condition include:  Severe nausea and vomiting that does not go away.  Problems keeping food down.  Weight loss.  Loss of body fluid (dehydration).  Loss of appetite. You may have no desire to eat or you may not like the food you have previously enjoyed. How is this diagnosed? This condition may be diagnosed based on your medical history, your symptoms, and a physical exam. You may also have other tests, including:  Blood tests.  Urine tests.  Blood pressure tests.  Ultrasound to look for problems with the placenta or to check if you are pregnant with more than one baby. How is this treated? This condition is managed by controlling symptoms. This may include:  Following an eating plan. This can help to lessen nausea and vomiting.  Treatments that do not use medicine. These include acupressure bracelets, hypnosis, and eating or drinking foods or fluids that contain ginger, ginger ale, or ginger  tea.  Taking prescription medicine or over-the-counter medicine as told by your health care provider.  Continuing to take prenatal vitamins. You may need to change what kind you take and when you take them. Follow your health care provider's instructions about prenatal vitamins. An eating plan and medicines are often used together to help control symptoms. If medicines do not help relieve nausea and vomiting, you may need to receive fluids through an IV at the hospital. Follow these instructions at home: To help relieve your symptoms, listen to your body. Everyone is different and has different preferences. Find what works best for you. Here are some things you can try to help relieve your symptoms: Meals and snacks  Eat 5-6 small meals daily instead of 3 large meals. Eating small meals and snacks can help you avoid an empty stomach.  Before getting out of bed, eat a couple of crackers to avoid moving around on an empty stomach.  Eat a protein-rich snack before bed. Examples include cheese and crackers, or a peanut butter sandwich made with 1 slice of whole-wheat bread and 1 tsp (5 g) of peanut butter.  Eat and drink slowly.  Try eating starchy foods as these are usually tolerated well. Examples include cereal, toast, bread, potatoes, pasta,  rice, and pretzels.  Eat at least one serving of protein with your meals and snacks. Protein options include lean meats, poultry, seafood, beans, nuts, nut butters, eggs, cheese, and yogurt.  Eat or suck on things that have ginger in them. It may help to relieve nausea. Add  tsp (0.44 g) ground ginger to hot tea, or choose ginger tea.   Fluids It is important to stay hydrated. Try to:  Drink small amounts of fluids often.  Drink fluids 30 minutes before or after a meal to help lessen the feeling of a full stomach.  Drink 100% fruit juice or an electrolyte drink. An electrolyte drink contains sodium, potassium, and chloride.  Drink fluids that are  cold, clear, and carbonated or sour. These include lemonade, ginger ale, lemon-lime soda, ice water, and sparkling water. Things to avoid Avoid the following:  Eating foods that trigger your symptoms. These may include spicy foods, coffee, high-fat foods, very sweet foods, and acidic foods.  Drinking more than 1 cup of fluid at a time.  Skipping meals. Nausea can be more intense on an empty stomach. If you cannot tolerate food, do not force it. Try sucking on ice chips or other frozen items and make up for missed calories later.  Lying down within 2 hours after eating.  Being exposed to environmental triggers. These may include food smells, smoky rooms, closed spaces, rooms with strong smells, warm or humid places, overly loud and noisy rooms, and rooms with motion or flickering lights. Try eating meals in a well-ventilated area that is free of strong smells.  Making quick and sudden changes in your movement.  Taking iron pills and multivitamins that contain iron. If you take prescription iron pills, do not stop taking them unless your health care provider approves.  Preparing food. The smell of food can spoil your appetite or trigger nausea. General instructions  Brush your teeth or use a mouth rinse after meals.  Take over-the-counter and prescription medicines only as told by your health care provider.  Follow instructions from your health care provider about eating or drinking restrictions.  Talk with your health care provider about starting a supplement of vitamin B6.  Continue to take your prenatal vitamins as told by your health care provider. If you are having trouble taking your prenatal vitamins, talk with your health care provider about other options.  Keep all follow-up visits. This is important. Follow-up visits include prenatal visits. Contact a health care provider if:  You have pain in your abdomen.  You have a severe headache.  You have vision problems.  You  are losing weight.  You feel weak or dizzy.  You cannot eat or drink without vomiting, especially if this goes on for a full day. Get help right away if:  You cannot drink fluids without vomiting.  You vomit blood.  You have constant nausea and vomiting.  You are very weak.  You faint.  You have a fever and your symptoms suddenly get worse. Summary  Hyperemesis gravidarum is a severe form of nausea and vomiting that happens during pregnancy.  Making some changes to your eating habits may help relieve nausea and vomiting.  This condition may be managed with lifestyle changes and medicines as prescribed by your health care provider.  If medicines do not help relieve nausea and vomiting, you may need to receive fluids through an IV at the hospital. This information is not intended to replace advice given to you by your health care provider.  Make sure you discuss any questions you have with your health care provider. Document Revised: 04/09/2020 Document Reviewed: 04/09/2020 Elsevier Patient Education  2021 [employer].

## 2021-11-06 ENCOUNTER — Emergency Department (HOSPITAL_COMMUNITY)
Admission: EM | Admit: 2021-11-06 | Discharge: 2021-11-06 | Disposition: A | Discharge status: Home / Self Care | Payer: Medicaid Other | Attending: Emergency Medicine | Admitting: Emergency Medicine

## 2021-11-06 ENCOUNTER — Emergency Department (HOSPITAL_COMMUNITY): Payer: Medicaid Other

## 2021-11-06 ENCOUNTER — Encounter (HOSPITAL_COMMUNITY): Payer: Self-pay | Admitting: Emergency Medicine

## 2021-11-06 ENCOUNTER — Other Ambulatory Visit: Payer: Self-pay

## 2021-11-06 DIAGNOSIS — R0602 Shortness of breath: Secondary | ICD-10-CM | POA: Insufficient documentation

## 2021-11-06 DIAGNOSIS — R531 Weakness: Secondary | ICD-10-CM | POA: Insufficient documentation

## 2021-11-06 DIAGNOSIS — R0789 Other chest pain: Secondary | ICD-10-CM | POA: Insufficient documentation

## 2021-11-06 DIAGNOSIS — Z5321 Procedure and treatment not carried out due to patient leaving prior to being seen by health care provider: Secondary | ICD-10-CM | POA: Insufficient documentation

## 2021-11-06 NOTE — ED Provider Triage Note (Addendum)
Emergency Medicine Provider Triage Evaluation Note  Laura [name] , a 21 y.o. female  was evaluated in triage.  Pt complains of R hand weakness which she noticed when she woke up at 6 am. No vision issues.  No slurred speech or confusion. States that last night around 7:30pm she noticed some chest tightness and SOB which she states felt like asthma/when she had covid. No fevers.   Had child December 5th   Review of Systems  Positive: Headache, R hand weakness, SOB, chest tight, sore throat Negative: NV, fever, cough  Physical Exam  BP (!) 124/94 (BP Location: Left Arm)    Pulse 80    Temp 98.3 F (36.8 C) (Oral)    Resp 16    Ht 5\' 3"  (1.6 m)    Wt 57 kg    LMP  (LMP Unknown)    SpO2 99%    BMI 22.26 kg/m  Gen:   Awake, no distress  Resp:  Normal effort  MSK:   Moves extremities without difficulty  Other:  R grip weakness. Moves all four extremities. No visual field deficits grossly.   Medical Decision Making  Medically screening exam initiated at 11:56 AM.  Appropriate orders placed.  Zayden Cocroft was informed that the remainder of the evaluation will be completed by another provider, this initial triage assessment does not replace that evaluation, and the importance of remaining in the ED until their evaluation is complete.  Workup initiated. Briefly discussed with Dr. Doren Custard.    Tedd Sias, [place] 11/06/21 1159    Pati Gallo [place], [place] 11/06/21 1216

## 2021-11-06 NOTE — ED Triage Notes (Signed)
Complains of central chest tightness since 7:30 pm last night, woke up this morning and noticed she had R sided leg and arm weakness, could not lift her leg into the shower.

## 2022-01-27 ENCOUNTER — Emergency Department (HOSPITAL_BASED_OUTPATIENT_CLINIC_OR_DEPARTMENT_OTHER)
Admission: EM | Admit: 2022-01-27 | Discharge: 2022-01-27 | Disposition: A | Discharge status: Home / Self Care | Payer: Medicaid Other | Attending: Emergency Medicine | Admitting: Emergency Medicine

## 2022-01-27 ENCOUNTER — Emergency Department (HOSPITAL_BASED_OUTPATIENT_CLINIC_OR_DEPARTMENT_OTHER): Payer: Medicaid Other

## 2022-01-27 ENCOUNTER — Other Ambulatory Visit: Payer: Self-pay

## 2022-01-27 ENCOUNTER — Encounter (HOSPITAL_BASED_OUTPATIENT_CLINIC_OR_DEPARTMENT_OTHER): Payer: Self-pay

## 2022-01-27 DIAGNOSIS — J45909 Unspecified asthma, uncomplicated: Secondary | ICD-10-CM | POA: Diagnosis not present

## 2022-01-27 DIAGNOSIS — U071 COVID-19: Secondary | ICD-10-CM | POA: Diagnosis not present

## 2022-01-27 DIAGNOSIS — R0602 Shortness of breath: Secondary | ICD-10-CM

## 2022-01-27 MED ORDER — ALBUTEROL SULFATE (2.5 MG/3ML) 0.083% IN NEBU
2.5000 mg | INHALATION_SOLUTION | Freq: Once | RESPIRATORY_TRACT | Status: AC
  Administered 2022-01-27: 2.5 mg via RESPIRATORY_TRACT
  Filled 2022-01-27: qty 3

## 2022-01-27 MED ORDER — ACETAMINOPHEN 500 MG PO TABS
1000.0000 mg | ORAL_TABLET | Freq: Once | ORAL | Status: AC
  Administered 2022-01-27: 1000 mg via ORAL
  Filled 2022-01-27: qty 2

## 2022-01-27 MED ORDER — ALBUTEROL SULFATE (2.5 MG/3ML) 0.083% IN NEBU: 2.5000 mg | INHALATION_SOLUTION | Freq: Four times a day (QID) | RESPIRATORY_TRACT | 12 refills | Status: DC | PRN

## 2022-01-27 MED ORDER — NIRMATRELVIR/RITONAVIR (PAXLOVID)TABLET: 3.0000 | ORAL_TABLET | Freq: Two times a day (BID) | ORAL | 0 refills | Status: AC

## 2022-01-27 NOTE — Discharge Instructions (Addendum)
You were seen in the emergency department today for shortness of breath. ? ?I am prescribing you the antiviral medicine for COVID. I've attached some information about the medication including side effects. ? ?I've also given you a nebulizer to have on hand if you need. ? ?Continue to monitor how you're doing and return to the ER for new or worsening symptoms.  ?

## 2022-01-27 NOTE — ED Triage Notes (Signed)
Patient presents with complaint of being covid positive by home test yesterday.  Patient states her breathing feels "tight."  Has been using home inhaler with no improvement of symptoms.   ?

## 2022-01-27 NOTE — ED Provider Notes (Signed)
?[facility] EMERGENCY DEPARTMENT ?Provider Note ? ? ?CSN: [id number] ?Arrival date & time: 01/27/22  1921 ? ?  ? ?History ? ?Chief Complaint  ?Patient presents with  ? Covid Positive  ? Shortness of Breath  ? ? ?Laura [name] is a 21 y.o. female who presents the emergency department complaining 1 day of shortness of breath.  Patient has a history of asthma, just tested yesterday for COVID-19 yesterday.  She states that her chest feels tight and she has been using her home inhaler with no improvement of symptoms.  No fever.  Took some cold medication last night, with some relief of her headache and body aches. ? ? ?Shortness of Breath ?Associated symptoms: cough and headaches   ?Associated symptoms: no abdominal pain, no fever and no vomiting   ? ?  ? ?Home Medications ?Prior to Admission medications   ?Medication Sig Start Date End Date Taking? Authorizing Provider  ?albuterol (PROVENTIL) (2.5 MG/3ML) 0.083% nebulizer solution Take 3 mLs (2.5 mg total) by nebulization every 6 (six) hours as needed for wheezing or shortness of breath. 01/27/22  Yes Loui Massenburg T, PA-C  ?nirmatrelvir/ritonavir EUA (PAXLOVID) 20 x 150 MG & 10 x 100MG  TABS Take 3 tablets by mouth 2 (two) times daily for 5 days. Take nirmatrelvir (150 mg) two tablets twice daily for 5 days and ritonavir (100 mg) one tablet twice daily for 5 days. 01/27/22 02/01/22 Yes Mouna Yager T, PA-C  ?albuterol (VENTOLIN HFA) 108 (90 Base) MCG/ACT inhaler Inhale 1-2 puffs into the lungs every 6 (six) hours as needed for wheezing or shortness of breath.    [provider]  ?amoxicillin (AMOXIL) 500 MG tablet Take 500 mg by mouth 3 (three) times daily. 04/26/20   [provider]  ?cefadroxil (DURICEF) 500 MG capsule Take 1 capsule (500 mg total) by mouth 2 (two) times daily. ?Patient not taking: Reported on 05/06/2020 02/26/20   02/28/20, CNM  ?famotidine (PEPCID) 20 MG tablet Take 1 tablet (20 mg total) by mouth daily. 02/19/21 02/19/22   Nugent, 02/21/22, NP  ?metoCLOPramide (REGLAN) 10 MG tablet Take 1 tablet (10 mg total) by mouth 3 (three) times daily with meals. 02/19/21 02/19/22  Nugent, 02/21/22, NP  ?ondansetron (ZOFRAN) 8 MG tablet Take by mouth every 8 (eight) hours as needed for nausea or vomiting.    [provider]  ?Prenatal Vit-Fe Fumarate-FA (PRENATAL COMPLETE) 14-0.4 MG TABS Take 1 tablet daily ?Patient not taking: Reported on 05/06/2020 11/26/19   11/28/19, PA-C  ?sertraline (ZOLOFT) 25 MG tablet Take 100 mg by mouth daily. 05/01/20   [provider]  ?valACYclovir (VALTREX) 500 MG tablet Take 500 mg by mouth 2 (two) times daily as needed (outbreak).  07/27/18   [provider]  ?   ? ?Allergies    ?Patient has no known allergies.   ? ?Review of Systems   ?Review of Systems  ?Constitutional:  Positive for fatigue. Negative for fever.  ?HENT:  Positive for congestion.   ?Respiratory:  Positive for cough, chest tightness and shortness of breath.   ?Gastrointestinal:  Negative for abdominal pain, diarrhea, nausea and vomiting.  ?Musculoskeletal:  Positive for myalgias.  ?Neurological:  Positive for headaches.  ?All other systems reviewed and are negative. ? ?Physical Exam ?Updated Vital Signs ?BP 113/78   Pulse 71   Temp 98.6 ?F (37 ?C) (Oral)   Resp 17   Ht 5\' 3"  (1.6 m)   Wt 59 kg   LMP 01/26/2022  SpO2 99%   BMI 23.03 kg/m?  ?Physical Exam ?Vitals and nursing note reviewed.  ?Constitutional:   ?   Appearance: Normal appearance.  ?HENT:  ?   Head: Normocephalic and atraumatic.  ?Eyes:  ?   Conjunctiva/sclera: Conjunctivae normal.  ?Cardiovascular:  ?   Rate and Rhythm: Normal rate and regular rhythm.  ?Pulmonary:  ?   Effort: Pulmonary effort is normal. No respiratory distress.  ?   Breath sounds: Normal breath sounds.  ?Abdominal:  ?   General: There is no distension.  ?   Palpations: Abdomen is soft.  ?   Tenderness: There is no abdominal tenderness.  ?Skin: ?   General: Skin is warm and dry.   ?Neurological:  ?   General: No focal deficit present.  ?   Mental Status: She is alert.  ? ? ?ED Results / Procedures / Treatments   ?Labs ?(all labs ordered are listed, but only abnormal results are displayed) ?Labs Reviewed - No data to display ? ?EKG ?None ? ?Radiology ?DG Chest 2 View ? ?Result Date: 01/27/2022 ?CLINICAL DATA:  COVID short of breath EXAM: CHEST - 2 VIEW COMPARISON:  11/06/2021 FINDINGS: The heart size and mediastinal contours are within normal limits. Both lungs are clear. The visualized skeletal structures are unremarkable. IMPRESSION: No active cardiopulmonary disease. Electronically Signed   By: Jasmine Pang M.D.   On: 01/27/2022 20:06   ? ?Procedures ?Procedures  ? ? ?Medications Ordered in ED ?Medications  ?albuterol (PROVENTIL) (2.5 MG/3ML) 0.083% nebulizer solution 2.5 mg (2.5 mg Nebulization Given 01/27/22 2251)  ?acetaminophen (TYLENOL) tablet 1,000 mg (1,000 mg Oral Given 01/27/22 2303)  ? ? ?ED Course/ Medical Decision Making/ A&P ?  ?                        ?Medical Decision Making ?Amount and/or Complexity of Data Reviewed ?Radiology: ordered. ? ?Risk ?OTC drugs. ?Prescription drug management. ? ? ?This patient is a 21 year old female who presents to the ED for concern of shortness of breath.  History of asthma, positive COVID-19 test yesterday. ? ?Differential diagnoses prior to evaluation: ?The emergent differential diagnosis includes, but is not limited to, acute asthma exacerbation, pneumonia, bronchitis, viral upper respiratory infection.  This is not an exhaustive differential.  ? ?Past Medical History / Co-morbidities: ?Asthma ? ?Additional history: ?Chart reviewed.  I reviewed patient's medications, ensure that they do not interact with Paxlovid. ? ?Physical Exam: ?Physical exam performed. The pertinent findings include: Patient is afebrile, not tachycardic, not hypoxic, no acute distress.  Lung sounds are clear to auscultation bilaterally. ? ?Lab Tests/Imaging studies: ?I  Ordered, and personally interpreted labs/imaging including chest x-ray.  The pertinent results include: No acute cardiopulmonary abnormalities. I agree with the radiologist interpretation. ?  ?Medications: ?I ordered medication including nebulizer breathing treatment and Tylenol for chest tightness and headache.  Upon reevaluation patient states that her symptoms have improved.  I have reviewed the patients home medicines and have made adjustments as needed. ?  ?Disposition: ?After consideration of the diagnostic results and the patients response to treatment, I feel that patient's not requiring admission or inpatient treatment for symptoms.  She says her chest tightness feels much better after breathing treatment, and is reasonable to send her with nebulizer for home.  Patient is inquiring about the antiviral medication for COVID, and I think this is reasonable as her history of asthma puts her at higher risk for complications.  We will discharge home with  these prescriptions and give close return precautions.. Discussed reasons to return to the emergency department, and the patient is agreeable to the plan.  ? ? ? ? ? ? ? ?Final Clinical Impression(s) / ED Diagnoses ?Final diagnoses:  ?COVID-19  ?Shortness of breath  ? ? ?Rx / DC Orders ?ED Discharge Orders   ? ?      Ordered  ?  nirmatrelvir/ritonavir EUA (PAXLOVID) 20 x 150 MG & 10 x 100MG  TABS  2 times daily       ? 01/27/22 2334  ?  albuterol (PROVENTIL) (2.5 MG/3ML) 0.083% nebulizer solution  Every 6 hours PRN       ? 01/27/22 2334  ? ?  ?  ? ?  ? ?Portions of this report may have been transcribed using voice recognition software. Every effort was made to ensure accuracy; however, inadvertent computerized transcription errors may be present. ? ?  ?Su MonksRoemhildt, Keir Foland T, PA-C ?01/27/22 2338 ? ?  ?Glynn Octaveancour, Stephen, MD ?01/28/22 0102 ? ?

## 2022-01-29 ENCOUNTER — Other Ambulatory Visit (HOSPITAL_BASED_OUTPATIENT_CLINIC_OR_DEPARTMENT_OTHER): Payer: Self-pay

## 2022-05-13 DIAGNOSIS — F411 Generalized anxiety disorder: Secondary | ICD-10-CM | POA: Insufficient documentation

## 2022-05-13 DIAGNOSIS — F4310 Post-traumatic stress disorder, unspecified: Secondary | ICD-10-CM | POA: Insufficient documentation

## 2022-06-02 ENCOUNTER — Emergency Department (HOSPITAL_BASED_OUTPATIENT_CLINIC_OR_DEPARTMENT_OTHER)
Admission: EM | Admit: 2022-06-02 | Discharge: 2022-06-02 | Disposition: A | Discharge status: Home / Self Care | Payer: Medicaid Other | Attending: Emergency Medicine | Admitting: Emergency Medicine

## 2022-06-02 ENCOUNTER — Other Ambulatory Visit: Payer: Self-pay

## 2022-06-02 ENCOUNTER — Encounter (HOSPITAL_BASED_OUTPATIENT_CLINIC_OR_DEPARTMENT_OTHER): Payer: Self-pay | Admitting: Emergency Medicine

## 2022-06-02 DIAGNOSIS — B349 Viral infection, unspecified: Secondary | ICD-10-CM | POA: Diagnosis not present

## 2022-06-02 DIAGNOSIS — M7918 Myalgia, other site: Secondary | ICD-10-CM | POA: Diagnosis present

## 2022-06-02 DIAGNOSIS — Z8616 Personal history of COVID-19: Secondary | ICD-10-CM | POA: Insufficient documentation

## 2022-06-02 MED ORDER — ONDANSETRON 4 MG PO TBDP: ORAL_TABLET | ORAL | 0 refills | Status: DC

## 2022-06-02 MED ORDER — BENZONATATE 100 MG PO CAPS: 100.0000 mg | ORAL_CAPSULE | Freq: Three times a day (TID) | ORAL | 0 refills | Status: DC

## 2022-06-02 MED ORDER — ONDANSETRON 4 MG PO TBDP
4.0000 mg | ORAL_TABLET | Freq: Once | ORAL | Status: AC
  Administered 2022-06-02: 4 mg via ORAL

## 2022-06-02 NOTE — ED Triage Notes (Signed)
Reports body aches, head congestion, and vomiting since yesterday.

## 2022-06-02 NOTE — Discharge Instructions (Signed)
Take tylenol 2 pills 4 times a day and motrin 4 pills 3 times a day.  Drink plenty of fluids.  Return for worsening shortness of breath, headache, confusion. Follow up with your family doctor.   

## 2022-06-02 NOTE — ED Provider Notes (Signed)
[facility] EMERGENCY DEPARTMENT Provider Note   CSN: [id number] Arrival date & time: 06/02/22  0048     History  Chief Complaint  Patient presents with   Vomiting   Generalized Body Aches    Laura [name] is a 21 y.o. female.  21 yo F with a chief complaints of congestion nausea vomiting and myalgias.  Been going on for couple days.  Took a home COVID test that was negative.  Actually had COVID back about 4 months ago.  No known sick contacts.  No difficulty breathing.  Has had some trouble eating and drinking today.        Home Medications Prior to Admission medications   Medication Sig Start Date End Date Taking? Authorizing Provider  benzonatate (TESSALON) 100 MG capsule Take 1 capsule (100 mg total) by mouth every 8 (eight) hours. 06/02/22  Yes Melene Plan, DO  ondansetron (ZOFRAN-ODT) 4 MG disintegrating tablet 4mg  ODT q4 hours prn nausea/vomit 06/02/22  Yes 08/02/22, DO  albuterol (PROVENTIL) (2.5 MG/3ML) 0.083% nebulizer solution Take 3 mLs (2.5 mg total) by nebulization every 6 (six) hours as needed for wheezing or shortness of breath. 01/27/22   Roemhildt, Lorin T, PA-C  albuterol (VENTOLIN HFA) 108 (90 Base) MCG/ACT inhaler Inhale 1-2 puffs into the lungs every 6 (six) hours as needed for wheezing or shortness of breath.    [provider]  amoxicillin (AMOXIL) 500 MG tablet Take 500 mg by mouth 3 (three) times daily. 04/26/20   [provider]  cefadroxil (DURICEF) 500 MG capsule Take 1 capsule (500 mg total) by mouth 2 (two) times daily. Patient not taking: Reported on 05/06/2020 02/26/20   02/28/20, CNM  famotidine (PEPCID) 20 MG tablet Take 1 tablet (20 mg total) by mouth daily. 02/19/21 02/19/22  Nugent, 02/21/22, NP  metoCLOPramide (REGLAN) 10 MG tablet Take 1 tablet (10 mg total) by mouth 3 (three) times daily with meals. 02/19/21 02/19/22  Nugent, 02/21/22, NP  ondansetron (ZOFRAN) 8 MG tablet Take by mouth every 8 (eight) hours as needed  for nausea or vomiting.    [provider]  Prenatal Vit-Fe Fumarate-FA (PRENATAL COMPLETE) 14-0.4 MG TABS Take 1 tablet daily Patient not taking: Reported on 05/06/2020 11/26/19   11/28/19, PA-C  sertraline (ZOLOFT) 25 MG tablet Take 100 mg by mouth daily. 05/01/20   [provider]  valACYclovir (VALTREX) 500 MG tablet Take 500 mg by mouth 2 (two) times daily as needed (outbreak).  07/27/18   [provider]      Allergies    Patient has no known allergies.    Review of Systems   Review of Systems  Physical Exam Updated Vital Signs BP 114/71   Pulse 88   Temp 98.7 F (37.1 C) (Oral)   Resp 18   Ht 5\' 3"  (1.6 m)   Wt 60.8 kg   LMP 05/12/2022   SpO2 99%   BMI 23.74 kg/m  Physical Exam Vitals and nursing note reviewed.  Constitutional:      General: She is not in acute distress.    Appearance: She is well-developed. She is not diaphoretic.  HENT:     Head: Normocephalic and atraumatic.     Comments: Swollen turbinates, posterior nasal drip, no noted sinus ttp, tm normal bilaterally.   Eyes:     Pupils: Pupils are equal, round, and reactive to light.  Cardiovascular:     Rate and Rhythm: Normal rate and regular rhythm.  Heart sounds: No murmur heard.    No friction rub. No gallop.  Pulmonary:     Effort: Pulmonary effort is normal.     Breath sounds: No wheezing or rales.  Abdominal:     General: There is no distension.     Palpations: Abdomen is soft.     Tenderness: There is no abdominal tenderness.  Musculoskeletal:        General: No tenderness.     Cervical back: Normal range of motion and neck supple.  Skin:    General: Skin is warm and dry.  Neurological:     Mental Status: She is alert and oriented to person, place, and time.  Psychiatric:        Behavior: Behavior normal.     ED Results / Procedures / Treatments   Labs (all labs ordered are listed, but only abnormal results are displayed) Labs Reviewed - No data to  display  EKG None  Radiology No results found.  Procedures Procedures    Medications Ordered in ED Medications  ondansetron (ZOFRAN-ODT) disintegrating tablet 4 mg (has no administration in time range)    ED Course/ Medical Decision Making/ A&P                           Medical Decision Making Risk Prescription drug management.   21 yo F with a chief complaints of congestion nausea vomiting going on for couple days.  She is well-appearing and nontoxic.  No bacterial source found on exam.  Has a benign abdominal exam.  She tells me that she has not been able to eat and drink at all today but she appears well-hydrated no tachycardia.  We will give a dose of nausea medicine here.  We will have her follow-up with her family doctor in the office.  1:23 AM:  I have discussed the diagnosis/risks/treatment options with the patient.  Evaluation and diagnostic testing in the emergency department does not suggest an emergent condition requiring admission or immediate intervention beyond what has been performed at this time.  They will follow up with  PCP. We also discussed returning to the ED immediately if new or worsening sx occur. We discussed the sx which are most concerning (e.g., sudden worsening pain, fever, inability to tolerate by mouth) that necessitate immediate return. Medications administered to the patient during their visit and any new prescriptions provided to the patient are listed below.  Medications given during this visit Medications  ondansetron (ZOFRAN-ODT) disintegrating tablet 4 mg (has no administration in time range)     The patient appears reasonably screen and/or stabilized for discharge and I doubt any other medical condition or other [facility] requiring further screening, evaluation, or treatment in the ED at this time prior to discharge.          Final Clinical Impression(s) / ED Diagnoses Final diagnoses:  Viral syndrome    Rx / DC Orders ED Discharge  Orders          Ordered    ondansetron (ZOFRAN-ODT) 4 MG disintegrating tablet        06/02/22 0121    benzonatate (TESSALON) 100 MG capsule  Every 8 hours        06/02/22 0121              Melene Plan, DO 06/02/22 0123

## 2022-12-08 ENCOUNTER — Other Ambulatory Visit: Payer: Self-pay

## 2022-12-08 ENCOUNTER — Encounter (HOSPITAL_BASED_OUTPATIENT_CLINIC_OR_DEPARTMENT_OTHER): Payer: Self-pay | Admitting: *Deleted

## 2022-12-08 DIAGNOSIS — R102 Pelvic and perineal pain: Secondary | ICD-10-CM | POA: Diagnosis present

## 2022-12-08 DIAGNOSIS — N760 Acute vaginitis: Secondary | ICD-10-CM | POA: Insufficient documentation

## 2022-12-08 MED ORDER — ACETAMINOPHEN 500 MG PO TABS: 1000.0000 mg | ORAL_TABLET | Freq: Once | ORAL | Status: DC

## 2022-12-08 NOTE — ED Triage Notes (Signed)
Pt is here for lower abdominal pain and thick white white vaginal discharge.  No itching or burning in vagina.

## 2022-12-09 ENCOUNTER — Emergency Department (HOSPITAL_BASED_OUTPATIENT_CLINIC_OR_DEPARTMENT_OTHER)
Admission: EM | Admit: 2022-12-09 | Discharge: 2022-12-09 | Disposition: A | Discharge status: Home / Self Care | Payer: Medicaid Other | Attending: Emergency Medicine | Admitting: Emergency Medicine

## 2022-12-09 DIAGNOSIS — N760 Acute vaginitis: Secondary | ICD-10-CM

## 2022-12-09 DIAGNOSIS — R102 Pelvic and perineal pain: Secondary | ICD-10-CM

## 2022-12-09 LAB — URINALYSIS, ROUTINE W REFLEX MICROSCOPIC
Bacteria, UA: NONE SEEN
Bilirubin Urine: NEGATIVE
Glucose, UA: NEGATIVE mg/dL
Hgb urine dipstick: NEGATIVE
Ketones, ur: NEGATIVE mg/dL
Nitrite: NEGATIVE
Protein, ur: NEGATIVE mg/dL
Specific Gravity, Urine: 1.028 (ref 1.005–1.030)
pH: 6.5 (ref 5.0–8.0)

## 2022-12-09 LAB — WET PREP, GENITAL
Sperm: NONE SEEN
Trich, Wet Prep: NONE SEEN
WBC, Wet Prep HPF POC: <10 (ref <10)
Yeast Wet Prep HPF POC: NONE SEEN

## 2022-12-09 LAB — PREGNANCY, URINE: Preg Test, Ur: NEGATIVE

## 2022-12-09 MED ORDER — METRONIDAZOLE 500 MG PO TABS: 500.0000 mg | ORAL_TABLET | Freq: Two times a day (BID) | ORAL | 0 refills | Status: DC

## 2022-12-09 NOTE — ED Provider Notes (Signed)
Laura [name]  Provider Note  CSN: [id number] Arrival date & time: 12/08/22 2315  History Chief Complaint  Patient presents with   Pelvic Pain    Laura [name] is a 22 y.o. female with history of BV and ovarian cysts reports lower abdominal pain since she got off work around 0700hrs. She reports some thick white vaginal discharge for the last 3 days. She has had similar pains with cysts and with BV in the past. She is not concerned about STI. No fever. No vomiting, no diarrhea, no dysuria or hematuria.    Home Medications Prior to Admission medications   Medication Sig Start Date End Date Taking? Authorizing Provider  metroNIDAZOLE (FLAGYL) 500 MG tablet Take 1 tablet (500 mg total) by mouth 2 (two) times daily. 12/09/22  Yes Truddie Hidden, MD  albuterol (PROVENTIL) (2.5 MG/3ML) 0.083% nebulizer solution Take 3 mLs (2.5 mg total) by nebulization every 6 (six) hours as needed for wheezing or shortness of breath. 01/27/22   Roemhildt, Lorin T, PA-C  albuterol (VENTOLIN HFA) 108 (90 Base) MCG/ACT inhaler Inhale 1-2 puffs into the lungs every 6 (six) hours as needed for wheezing or shortness of breath.    [provider]  benzonatate (TESSALON) 100 MG capsule Take 1 capsule (100 mg total) by mouth every 8 (eight) hours. 06/02/22   Deno Etienne, DO  famotidine (PEPCID) 20 MG tablet Take 1 tablet (20 mg total) by mouth daily. 02/19/21 02/19/22  Nugent, Gerrie Nordmann, NP  metoCLOPramide (REGLAN) 10 MG tablet Take 1 tablet (10 mg total) by mouth 3 (three) times daily with meals. 02/19/21 02/19/22  Nugent, Gerrie Nordmann, NP  ondansetron (ZOFRAN) 8 MG tablet Take by mouth every 8 (eight) hours as needed for nausea or vomiting.    [provider]  ondansetron (ZOFRAN-ODT) 4 MG disintegrating tablet '4mg'$  ODT q4 hours prn nausea/vomit 06/02/22   Deno Etienne, DO  Prenatal Vit-Fe Fumarate-FA (PRENATAL COMPLETE) 14-0.4 MG TABS Take 1 tablet daily Patient not  taking: Reported on 05/06/2020 11/26/19   Montine Circle, PA-C  sertraline (ZOLOFT) 25 MG tablet Take 100 mg by mouth daily. 05/01/20   [provider]  valACYclovir (VALTREX) 500 MG tablet Take 500 mg by mouth 2 (two) times daily as needed (outbreak).  07/27/18   [provider]     Allergies    Patient has no known allergies.   Review of Systems   Review of Systems Please see HPI for pertinent positives and negatives  Physical Exam BP 125/82 (BP Location: Right Arm)   Pulse 72   Temp (!) 97.5 F (36.4 C)   Resp 18   Wt 59.9 kg   LMP 11/14/2022 (Approximate)   SpO2 100%   Breastfeeding No   BMI 23.38 kg/m   Physical Exam Vitals and nursing note reviewed.  Constitutional:      Appearance: Normal appearance.  HENT:     Head: Normocephalic and atraumatic.     Nose: Nose normal.     Mouth/Throat:     Mouth: Mucous membranes are moist.  Eyes:     Extraocular Movements: Extraocular movements intact.     Conjunctiva/sclera: Conjunctivae normal.  Cardiovascular:     Rate and Rhythm: Normal rate.  Pulmonary:     Effort: Pulmonary effort is normal.     Breath sounds: Normal breath sounds.  Abdominal:     General: Abdomen is flat.     Palpations: Abdomen is soft.  Tenderness: There is no abdominal tenderness. There is no guarding.  Genitourinary:    Comments: deferred Musculoskeletal:        General: No swelling. Normal range of motion.     Cervical back: Neck supple.  Skin:    General: Skin is warm and dry.  Neurological:     General: No focal deficit present.     Mental Status: She is alert.  Psychiatric:        Mood and Affect: Mood normal.     ED Results / Procedures / Treatments   EKG None  Procedures Procedures  Medications Ordered in the ED Medications  acetaminophen (TYLENOL) tablet 1,000 mg (has no administration in time range)    Initial Impression and Plan  Patient here with vaginal discharge and lower abdominal  discomfort. Exam is benign, no focal tenderness to suggest acute surgical process such as ovarian torsion or TOA. She had labs done in triage showing normal UA and HCG. Wet prep with clue cells concerning for BV. Patient offered pain medications but declines. Will give Flagyl for BV and recommend close outpatient Gyn follow up if not improving. RTED for any other concerns.   ED Course       MDM Rules/Calculators/A&P Medical Decision Making Problems Addressed: Bacterial vaginosis: acute illness or injury Pelvic pain: acute illness or injury  Amount and/or Complexity of Data Reviewed Labs: ordered. Decision-making details documented in ED Course.  Risk OTC drugs. Prescription drug management.     Final Clinical Impression(s) / ED Diagnoses Final diagnoses:  Bacterial vaginosis  Pelvic pain    Rx / DC Orders ED Discharge Orders          Ordered    metroNIDAZOLE (FLAGYL) 500 MG tablet  2 times daily        12/09/22 0254             Truddie Hidden, MD 12/09/22 [phone]

## 2022-12-10 LAB — GC/CHLAMYDIA PROBE AMP (~~LOC~~) NOT AT ARMC
Chlamydia: NEGATIVE
Comment: NEGATIVE
Comment: NORMAL
Neisseria Gonorrhea: NEGATIVE

## 2023-01-26 ENCOUNTER — Emergency Department (HOSPITAL_BASED_OUTPATIENT_CLINIC_OR_DEPARTMENT_OTHER): Payer: Medicaid Other | Admitting: Radiology

## 2023-01-26 ENCOUNTER — Other Ambulatory Visit: Payer: Self-pay

## 2023-01-26 ENCOUNTER — Encounter (HOSPITAL_BASED_OUTPATIENT_CLINIC_OR_DEPARTMENT_OTHER): Payer: Self-pay | Admitting: Emergency Medicine

## 2023-01-26 ENCOUNTER — Emergency Department (HOSPITAL_BASED_OUTPATIENT_CLINIC_OR_DEPARTMENT_OTHER)
Admission: EM | Admit: 2023-01-26 | Discharge: 2023-01-26 | Disposition: A | Discharge status: Home / Self Care | Payer: Medicaid Other | Attending: Emergency Medicine | Admitting: Emergency Medicine

## 2023-01-26 ENCOUNTER — Other Ambulatory Visit (HOSPITAL_BASED_OUTPATIENT_CLINIC_OR_DEPARTMENT_OTHER): Payer: Self-pay

## 2023-01-26 DIAGNOSIS — J45909 Unspecified asthma, uncomplicated: Secondary | ICD-10-CM | POA: Insufficient documentation

## 2023-01-26 DIAGNOSIS — N898 Other specified noninflammatory disorders of vagina: Secondary | ICD-10-CM | POA: Diagnosis not present

## 2023-01-26 DIAGNOSIS — R109 Unspecified abdominal pain: Secondary | ICD-10-CM | POA: Diagnosis present

## 2023-01-26 DIAGNOSIS — N730 Acute parametritis and pelvic cellulitis: Secondary | ICD-10-CM

## 2023-01-26 DIAGNOSIS — T8332XA Displacement of intrauterine contraceptive device, initial encounter: Secondary | ICD-10-CM

## 2023-01-26 DIAGNOSIS — N739 Female pelvic inflammatory disease, unspecified: Secondary | ICD-10-CM | POA: Diagnosis not present

## 2023-01-26 LAB — CBC
HCT: 37 % (ref 36.0–46.0)
Hemoglobin: 11.9 g/dL — ABNORMAL LOW (ref 12.0–15.0)
MCH: 23.4 pg — ABNORMAL LOW (ref 26.0–34.0)
MCHC: 32.2 g/dL (ref 30.0–36.0)
MCV: 72.8 fL — ABNORMAL LOW (ref 80.0–100.0)
Platelets: 196 10*3/uL (ref 150–400)
RBC: 5.08 MIL/uL (ref 3.87–5.11)
RDW: 13.5 % (ref 11.5–15.5)
WBC: 4.1 10*3/uL (ref 4.0–10.5)
nRBC: 0 % (ref 0.0–0.2)

## 2023-01-26 LAB — URINALYSIS, ROUTINE W REFLEX MICROSCOPIC
Bacteria, UA: NONE SEEN
Bilirubin Urine: NEGATIVE
Glucose, UA: NEGATIVE mg/dL
Hgb urine dipstick: NEGATIVE
Ketones, ur: NEGATIVE mg/dL
Nitrite: NEGATIVE
Protein, ur: NEGATIVE mg/dL
Specific Gravity, Urine: 1.021 (ref 1.005–1.030)
pH: 7.5 (ref 5.0–8.0)

## 2023-01-26 LAB — WET PREP, GENITAL
Clue Cells Wet Prep HPF POC: NONE SEEN
Sperm: NONE SEEN
Trich, Wet Prep: NONE SEEN
WBC, Wet Prep HPF POC: >=10 — AB (ref <10)
Yeast Wet Prep HPF POC: NONE SEEN

## 2023-01-26 LAB — COMPREHENSIVE METABOLIC PANEL
ALT: 7 U/L (ref 0–44)
AST: 15 U/L (ref 15–41)
Albumin: 4.1 g/dL (ref 3.5–5.0)
Alkaline Phosphatase: 31 U/L — ABNORMAL LOW (ref 38–126)
Anion gap: 8 (ref 5–15)
BUN: 11 mg/dL (ref 6–20)
CO2: 25 mmol/L (ref 22–32)
Calcium: 9.4 mg/dL (ref 8.9–10.3)
Chloride: 104 mmol/L (ref 98–111)
Creatinine, Ser: 0.66 mg/dL (ref 0.44–1.00)
GFR, Estimated: >60 mL/min (ref >60)
Glucose, Bld: 91 mg/dL (ref 70–99)
Potassium: 3.5 mmol/L (ref 3.5–5.1)
Sodium: 137 mmol/L (ref 135–145)
Total Bilirubin: 0.6 mg/dL (ref 0.3–1.2)
Total Protein: 7.3 g/dL (ref 6.5–8.1)

## 2023-01-26 LAB — LIPASE, BLOOD: Lipase: 38 U/L (ref 11–51)

## 2023-01-26 LAB — PREGNANCY, URINE: Preg Test, Ur: NEGATIVE

## 2023-01-26 MED ORDER — ACETAMINOPHEN 500 MG PO TABS
1000.0000 mg | ORAL_TABLET | Freq: Once | ORAL | Status: AC
  Administered 2023-01-26: 1000 mg via ORAL
  Filled 2023-01-26: qty 2

## 2023-01-26 MED ORDER — DOXYCYCLINE HYCLATE 100 MG PO CAPS
100.0000 mg | ORAL_CAPSULE | Freq: Two times a day (BID) | ORAL | 0 refills | Status: DC
  Filled 2023-01-26: qty 20, 10d supply, fill #0

## 2023-01-26 MED ORDER — LIDOCAINE HCL (PF) 1 % IJ SOLN
1.0000 mL | Freq: Once | INTRAMUSCULAR | Status: AC
  Administered 2023-01-26: 1 mL
  Filled 2023-01-26: qty 5

## 2023-01-26 MED ORDER — CEFTRIAXONE SODIUM 1 G IJ SOLR
1.0000 g | Freq: Once | INTRAMUSCULAR | Status: AC
  Administered 2023-01-26: 1 g via INTRAMUSCULAR
  Filled 2023-01-26: qty 10

## 2023-01-26 NOTE — ED Notes (Signed)
Pelvic exam supplies at bedside  

## 2023-01-26 NOTE — Discharge Instructions (Addendum)
Please take your antibiotics as prescribed. Take tylenol/ibuprofen for pain. I recommend close follow-up with OB-GYN for reevaluation.  Please do not hesitate to return to emergency department if worrisome signs symptoms we discussed become apparent.

## 2023-01-26 NOTE — ED Provider Notes (Addendum)
EMERGENCY DEPARTMENT AT [facility] Provider Note   CSN: [id number] Arrival date & time: 01/26/23  1056     History  Chief Complaint  Patient presents with   Abdominal Pain    Laura [name] is a 22 y.o. female with a past medical history of genital herpes, chlamydia presents today for evaluation of dysuria and abdominal pain.  Patient reports she started to have lower abdominal pain last night.  This morning she developed pain with urination.  She denies any blood in her stool or urine.  She denies any abnormal vaginal discharge.  She was evaluated at urgent care today, was told to have abnormal vaginal discharge and suspect PID.  She was told to come to the emergency department for further evaluation.  She denies any fever.  She endorses nausea without vomiting.  No chest pain, shortness of breath, bowel changes, rash.  Patient is sexually active.  She has an IUD.  She was told at urgent care that they cannot find a string of her IUD.  States she has irregular menstrual cycle.   Abdominal Pain     Past Medical History:  Diagnosis Date   Anemia    Asthma    Genital herpes    Ovarian cyst    Past Surgical History:  Procedure Laterality Date   NO PAST SURGERIES     OVARIAN CYST REMOVAL       Home Medications Prior to Admission medications   Medication Sig Start Date End Date Taking? Authorizing Provider  albuterol (PROVENTIL) (2.5 MG/3ML) 0.083% nebulizer solution Take 3 mLs (2.5 mg total) by nebulization every 6 (six) hours as needed for wheezing or shortness of breath. 01/27/22   Roemhildt, Lorin T, PA-C  albuterol (VENTOLIN HFA) 108 (90 Base) MCG/ACT inhaler Inhale 1-2 puffs into the lungs every 6 (six) hours as needed for wheezing or shortness of breath.    [provider]  benzonatate (TESSALON) 100 MG capsule Take 1 capsule (100 mg total) by mouth every 8 (eight) hours. 06/02/22   Melene Plan, DO  famotidine (PEPCID) 20 MG tablet Take 1 tablet  (20 mg total) by mouth daily. 02/19/21 02/19/22  Nugent, Odie Sera, NP  metoCLOPramide (REGLAN) 10 MG tablet Take 1 tablet (10 mg total) by mouth 3 (three) times daily with meals. 02/19/21 02/19/22  Nugent, Odie Sera, NP  metroNIDAZOLE (FLAGYL) 500 MG tablet Take 1 tablet (500 mg total) by mouth 2 (two) times daily. 12/09/22   Pollyann Savoy, MD  ondansetron (ZOFRAN) 8 MG tablet Take by mouth every 8 (eight) hours as needed for nausea or vomiting.    [provider]  ondansetron (ZOFRAN-ODT) 4 MG disintegrating tablet 4mg  ODT q4 hours prn nausea/vomit 06/02/22   Melene Plan, DO  Prenatal Vit-Fe Fumarate-FA (PRENATAL COMPLETE) 14-0.4 MG TABS Take 1 tablet daily Patient not taking: Reported on 05/06/2020 11/26/19   Roxy Horseman, PA-C  sertraline (ZOLOFT) 25 MG tablet Take 100 mg by mouth daily. 05/01/20   [provider]  valACYclovir (VALTREX) 500 MG tablet Take 500 mg by mouth 2 (two) times daily as needed (outbreak).  07/27/18   [provider]      Allergies    Patient has no known allergies.    Review of Systems   Review of Systems  Gastrointestinal:  Positive for abdominal pain.    Physical Exam Updated Vital Signs BP 122/77 (BP Location: Right Arm)   Pulse 79   Temp 98.1 F (36.7 C)  Resp 16   Ht 5\' 3"  (1.6 m)   Wt 59.9 kg   SpO2 100%   BMI 23.38 kg/m  Physical Exam Vitals and nursing note reviewed.  Constitutional:      Appearance: Normal appearance.  HENT:     Head: Normocephalic and atraumatic.     Mouth/Throat:     Mouth: Mucous membranes are moist.  Eyes:     General: No scleral icterus. Cardiovascular:     Rate and Rhythm: Normal rate and regular rhythm.     Pulses: Normal pulses.     Heart sounds: Normal heart sounds.  Pulmonary:     Effort: Pulmonary effort is normal.     Breath sounds: Normal breath sounds.  Abdominal:     General: Abdomen is flat.     Palpations: Abdomen is soft.     Tenderness: There is no abdominal tenderness.   Genitourinary:    Comments: Nurse tech present throughout exam. Mild white discharge in the vaginal canal.  Positive cervical motion tenderness. Musculoskeletal:        General: No deformity.  Skin:    General: Skin is warm.     Findings: No rash.  Neurological:     General: No focal deficit present.     Mental Status: She is alert.  Psychiatric:        Mood and Affect: Mood normal.     ED Results / Procedures / Treatments   Labs (all labs ordered are listed, but only abnormal results are displayed) Labs Reviewed  COMPREHENSIVE METABOLIC PANEL - Abnormal; Notable for the following components:      Result Value   Alkaline Phosphatase 31 (*)    All other components within normal limits  CBC - Abnormal; Notable for the following components:   Hemoglobin 11.9 (*)    MCV 72.8 (*)    MCH 23.4 (*)    All other components within normal limits  URINALYSIS, ROUTINE W REFLEX MICROSCOPIC - Abnormal; Notable for the following components:   Leukocytes,Ua TRACE (*)    All other components within normal limits  WET PREP, GENITAL  LIPASE, BLOOD  PREGNANCY, URINE  GC/CHLAMYDIA PROBE AMP ([place]) NOT AT [facility]    EKG None  Radiology No results found.  Procedures Procedures    Medications Ordered in ED Medications - No data to display  ED Course/ Medical Decision Making/ A&P                             Medical Decision Making Amount and/or Complexity of Data Reviewed Labs: ordered. Radiology: ordered.  Risk OTC drugs. Prescription drug management.   This patient presents to the ED for abdominal pain, dysuria, this involves an extensive number of treatment options, and is a complaint that carries with a high risk of complications and morbidity.  The differential diagnosis includes STI, PID, kidney stones, appendicitis, ovarian torsion, GC/chlamydia, trichomonas.  This is not an exhaustive list.  Lab tests: I ordered and personally interpreted labs.  The pertinent  results include: WBC unremarkable. Hbg unremarkable. Platelets unremarkable. Electrolytes unremarkable. BUN, creatinine unremarkable.  Urinalysis unremarkable.  Wet prep negative for yeast, trichomonas, BV.  Urine pregnancy test negative.  GC chlamydia ordered and pending.  Imaging studies: I ordered imaging studies. I personally reviewed, interpreted imaging and agree with the radiologist's interpretations. The results include: X-ray of the abdomen showed a malpositioned IUD.  Problem list/ ED course/ Critical interventions/ Medical management: HPI:  See above Vital signs within normal range and stable throughout visit. Laboratory/imaging studies significant for: See above. On physical examination, patient is afebrile and appears in no acute distress.  GU exam was done with nurse tech present throughout exam.  There was malodorous white discharge in the vaginal canal.  Patient has positive cervical motion tenderness.  CBC with no evidence of leukocytosis or anemia.  CMP with no acute electrolyte abnormality or AKI.  X-ray of the abdomen showed a malpositioned IUD.  Will consult OB/GYN. Given Rocephin.  I sent Rx of doxycycline.  Advised patient to take her antibiotics as prescribed, take Tylenol or ibuprofen for pain, follow-up with OB/GYN for further evaluation management.  Strict return precaution discussed. I have reviewed the patient home medicines and have made adjustments as needed.  Cardiac monitoring/EKG: The patient was maintained on a cardiac monitor.  I personally reviewed and interpreted the cardiac monitor which showed an underlying rhythm of: sinus rhythm.  Additional history obtained: External records from outside source obtained and reviewed including: Chart review including previous notes, labs, imaging.  Consultations obtained: I requested consultation with Dr. Despina Hidden OB-GYN, and discussed lab and imaging findings as well as pertinent plan.  He recommended Rocephin 1 g IM,  doxycycline twice daily x 10 days and outpatient OB/GYN follow-up.  Disposition Continued outpatient therapy. Follow-up with OB-GYN recommended for reevaluation of symptoms. Treatment plan discussed with patient.  Pt acknowledged understanding was agreeable to the plan. Worrisome signs and symptoms were discussed with patient, and patient acknowledged understanding to return to the ED if they noticed these signs and symptoms. Patient was stable upon discharge.   This chart was dictated using voice recognition software.  Despite best efforts to proofread,  errors can occur which can change the documentation meaning.          Final Clinical Impression(s) / ED Diagnoses Final diagnoses:  PID (acute pelvic inflammatory disease)    Rx / DC Orders ED Discharge Orders          Ordered    doxycycline (VIBRAMYCIN) 100 MG capsule  2 times daily        01/26/23 1618              Jeanelle Malling, PA 01/26/23 1643    Jeanelle Malling, PA 01/26/23 1644    Ernie Avena, MD 01/26/23 [phone]

## 2023-01-26 NOTE — ED Triage Notes (Signed)
Pt arrives to ED with c/o lower abdominal pain that started last night. Was recently treated for chlamydia on 4/13. [facility] and the practitioner noticed cervical motion tenderness and was unable to find her IUD strings.

## 2023-01-27 LAB — GC/CHLAMYDIA PROBE AMP (~~LOC~~) NOT AT ARMC
Chlamydia: NEGATIVE
Comment: NEGATIVE
Comment: NORMAL
Neisseria Gonorrhea: NEGATIVE

## 2023-02-16 ENCOUNTER — Encounter (HOSPITAL_COMMUNITY): Payer: Self-pay | Admitting: Emergency Medicine

## 2023-02-16 ENCOUNTER — Ambulatory Visit (HOSPITAL_COMMUNITY)
Admission: EM | Admit: 2023-02-16 | Discharge: 2023-02-16 | Disposition: A | Discharge status: Home / Self Care | Payer: Medicaid Other | Attending: Urgent Care | Admitting: Urgent Care

## 2023-02-16 DIAGNOSIS — N898 Other specified noninflammatory disorders of vagina: Secondary | ICD-10-CM | POA: Diagnosis not present

## 2023-02-16 DIAGNOSIS — T8332XD Displacement of intrauterine contraceptive device, subsequent encounter: Secondary | ICD-10-CM | POA: Diagnosis present

## 2023-02-16 NOTE — Discharge Instructions (Addendum)
You were tested today for gonorrhea, chlamydia, trichomonas, bacterial vaginosis, yeast. Please avoid all forms of intercourse until test results are received.  We will treat based upon the results of the test.  Please call gynecologists around town to schedule an appointment for IUD removal. I have listed 4 on this sheet to call and schedule an appointment at your convenience.

## 2023-02-16 NOTE — ED Triage Notes (Signed)
Pt c/o vaginal discharge that is grayish-green. Requesting STD testing. Denies known exposure.

## 2023-02-16 NOTE — ED Provider Notes (Signed)
[facility]    CSN: [id number] Arrival date & time: 02/16/23  1815      History   Chief Complaint Chief Complaint  Patient presents with   Vaginal Discharge    HPI Laura [name] is a 22 y.o. female.   Pleasant 22 year old female presents today due to concerns of vaginal discharge.  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  Was seen for PID last month, got appropriate treatment and states all the symptoms resolved.  She did follow-up with her gynecologist had an ultrasound performed which showed that her IUD is displaced.  She is wanting to get it removed but the strings are not visible.  She denies any pelvic pain.  She states over the past several days she has had a greenish-gray discharge, but no pelvic pain, dysuria, hematuria, flank pain, fever, or any additional symptoms.   Vaginal Discharge   Past Medical History:  Diagnosis Date   Anemia    Asthma    Genital herpes    Ovarian cyst     There are no problems to display for this patient.   Past Surgical History:  Procedure Laterality Date   NO PAST SURGERIES     OVARIAN CYST REMOVAL      OB History     Gravida  3   Para  2   Term  2   Preterm      AB      Living  2      SAB      IAB      Ectopic      Multiple      Live Births  2            Home Medications    Prior to Admission medications   Medication Sig Start Date End Date Taking? Authorizing Provider  escitalopram (LEXAPRO) 10 MG tablet Take 5 mg by mouth at bedtime. 06/17/22  Yes [provider]  albuterol (PROVENTIL) (2.5 MG/3ML) 0.083% nebulizer solution Take 3 mLs (2.5 mg total) by nebulization every 6 (six) hours as needed for wheezing or shortness of breath. 01/27/22   Roemhildt, Lorin T, PA-C  albuterol (VENTOLIN HFA) 108 (90 Base) MCG/ACT inhaler Inhale 1-2 puffs into the lungs every 6 (six) hours as needed for wheezing or shortness of breath.    [provider]  ondansetron (ZOFRAN)  8 MG tablet Take by mouth every 8 (eight) hours as needed for nausea or vomiting.    [provider]    Family History Family History  Problem Relation Age of Onset   Epilepsy Father     Social History Social History   Tobacco Use   Smoking status: Former    Types: Cigarettes, Cigars    Quit date: 10/07/2018    Years since quitting: 4.3   Smokeless tobacco: Never  Vaping Use   Vaping Use: Former  Substance Use Topics   Alcohol use: No   Drug use: No     Allergies   Patient has no known allergies.   Review of Systems Review of Systems  Genitourinary:  Positive for vaginal discharge.     Physical Exam Triage Vital Signs ED Triage Vitals  Enc Vitals Group     BP 02/16/23 1847 124/80     Pulse Rate 02/16/23 1847 70     Resp 02/16/23 1847 15     Temp 02/16/23 1847 98.5 F (36.9 C)     Temp Source 02/16/23 1847 Oral  SpO2 02/16/23 1847 98 %     Weight --      Height --      Head Circumference --      Peak Flow --      Pain Score 02/16/23 1844 0     Pain Loc --      Pain Edu? --      Excl. in GC? --    No data found.  Updated Vital Signs BP 124/80 (BP Location: Left Arm)   Pulse 70   Temp 98.5 F (36.9 C) (Oral)   Resp 15   SpO2 98%   Visual Acuity Right Eye Distance:   Left Eye Distance:   Bilateral Distance:    Right Eye Near:   Left Eye Near:    Bilateral Near:     Physical Exam Vitals and nursing note reviewed.  Constitutional:      General: She is not in acute distress.    Appearance: Normal appearance. She is well-developed. She is not ill-appearing, toxic-appearing or diaphoretic.  HENT:     Head: Normocephalic and atraumatic.  Eyes:     Conjunctiva/sclera: Conjunctivae normal.  Cardiovascular:     Rate and Rhythm: Normal rate.     Heart sounds: No murmur heard. Pulmonary:     Effort: Pulmonary effort is normal. No respiratory distress.  Abdominal:     General: Abdomen is flat. There is no distension.     Palpations:  Abdomen is soft.     Tenderness: There is no abdominal tenderness. There is no guarding or rebound.  Musculoskeletal:        General: No swelling.     Cervical back: Neck supple.  Skin:    General: Skin is warm and dry.     Coloration: Skin is not jaundiced.     Findings: No erythema or rash.  Neurological:     General: No focal deficit present.     Mental Status: She is alert and oriented to person, place, and time.  Psychiatric:        Mood and Affect: Mood normal.      UC Treatments / Results  Labs (all labs ordered are listed, but only abnormal results are displayed) Labs Reviewed  CERVICOVAGINAL ANCILLARY ONLY    EKG   Radiology No results found.  Procedures Procedures (including critical care time)  Medications Ordered in UC Medications - No data to display  Initial Impression / Assessment and Plan / UC Course  I have reviewed the triage vital signs and the nursing notes.  Pertinent labs & imaging results that were available during my care of the patient were reviewed by me and considered in my medical decision making (see chart for details).     Vaginal discharge - pt with hx of BV. Will obtain aptima swab. Offered pelvic exam, declined. Will send in tx based upon results of the vaginal swab today. IUD displaced - not causing any pain or emergency sx. Will need to establish with gynecology to discuss removal options.   Final Clinical Impressions(s) / UC Diagnoses   Final diagnoses:  Vaginal discharge  Displacement of intrauterine contraceptive device, subsequent encounter     Discharge Instructions      You were tested today for gonorrhea, chlamydia, trichomonas, bacterial vaginosis, yeast. Please avoid all forms of intercourse until test results are received.  We will treat based upon the results of the test.  Please call gynecologists around town to schedule an appointment for IUD removal. I have  listed 4 on this sheet to call and schedule an  appointment at your convenience.     ED Prescriptions   None    PDMP not reviewed this encounter.   Maretta Bees, [place] 02/17/23 [phone]

## 2023-02-17 LAB — CERVICOVAGINAL ANCILLARY ONLY
Bacterial Vaginitis (gardnerella): NEGATIVE
Candida Glabrata: NEGATIVE
Candida Vaginitis: POSITIVE — AB
Chlamydia: NEGATIVE
Comment: NEGATIVE
Comment: NEGATIVE
Comment: NEGATIVE
Comment: NEGATIVE
Comment: NEGATIVE
Comment: NORMAL
Neisseria Gonorrhea: NEGATIVE
Trichomonas: NEGATIVE

## 2023-02-18 ENCOUNTER — Telehealth (HOSPITAL_COMMUNITY): Payer: Self-pay | Admitting: Emergency Medicine

## 2023-02-18 MED ORDER — FLUCONAZOLE 150 MG PO TABS: 150.0000 mg | ORAL_TABLET | Freq: Once | ORAL | 0 refills | Status: AC

## 2023-02-18 NOTE — Telephone Encounter (Signed)
For positive yeast

## 2023-02-25 ENCOUNTER — Ambulatory Visit (HOSPITAL_COMMUNITY)
Admission: EM | Admit: 2023-02-25 | Discharge: 2023-02-25 | Disposition: A | Discharge status: Home / Self Care | Payer: Medicaid Other | Attending: Internal Medicine | Admitting: Internal Medicine

## 2023-02-25 ENCOUNTER — Emergency Department (HOSPITAL_COMMUNITY)
Admission: EM | Admit: 2023-02-25 | Discharge: 2023-02-25 | Discharge status: Against Medical Advice | Payer: Medicaid Other | Attending: Emergency Medicine | Admitting: Emergency Medicine

## 2023-02-25 ENCOUNTER — Other Ambulatory Visit: Payer: Self-pay

## 2023-02-25 ENCOUNTER — Encounter (HOSPITAL_COMMUNITY): Payer: Self-pay | Admitting: Emergency Medicine

## 2023-02-25 DIAGNOSIS — Z5321 Procedure and treatment not carried out due to patient leaving prior to being seen by health care provider: Secondary | ICD-10-CM | POA: Insufficient documentation

## 2023-02-25 DIAGNOSIS — N3001 Acute cystitis with hematuria: Secondary | ICD-10-CM

## 2023-02-25 DIAGNOSIS — R1110 Vomiting, unspecified: Secondary | ICD-10-CM | POA: Insufficient documentation

## 2023-02-25 DIAGNOSIS — R109 Unspecified abdominal pain: Secondary | ICD-10-CM | POA: Diagnosis not present

## 2023-02-25 DIAGNOSIS — K5900 Constipation, unspecified: Secondary | ICD-10-CM

## 2023-02-25 HISTORY — DX: Anxiety disorder, unspecified: F41.9

## 2023-02-25 HISTORY — DX: Depression, unspecified: F32.A

## 2023-02-25 LAB — URINALYSIS, ROUTINE W REFLEX MICROSCOPIC
Bilirubin Urine: NEGATIVE
Glucose, UA: NEGATIVE mg/dL
Hgb urine dipstick: NEGATIVE
Ketones, ur: NEGATIVE mg/dL
Nitrite: NEGATIVE
Protein, ur: NEGATIVE mg/dL
Specific Gravity, Urine: 1.028 (ref 1.005–1.030)
pH: 5 (ref 5.0–8.0)

## 2023-02-25 LAB — POCT URINALYSIS DIP (MANUAL ENTRY)
Bilirubin, UA: NEGATIVE
Glucose, UA: NEGATIVE mg/dL
Ketones, POC UA: NEGATIVE mg/dL
Nitrite, UA: NEGATIVE
Protein Ur, POC: 100 mg/dL — AB
Spec Grav, UA: >=1.03 — AB (ref 1.010–1.025)
Urobilinogen, UA: 4 E.U./dL — AB
pH, UA: 7 (ref 5.0–8.0)

## 2023-02-25 LAB — I-STAT BETA HCG BLOOD, ED (MC, WL, AP ONLY): I-stat hCG, quantitative: <5 m[IU]/mL (ref <5)

## 2023-02-25 LAB — COMPREHENSIVE METABOLIC PANEL
ALT: 13 U/L (ref 0–44)
AST: 18 U/L (ref 15–41)
Albumin: 3.6 g/dL (ref 3.5–5.0)
Alkaline Phosphatase: 35 U/L — ABNORMAL LOW (ref 38–126)
Anion gap: 9 (ref 5–15)
BUN: 15 mg/dL (ref 6–20)
CO2: 22 mmol/L (ref 22–32)
Calcium: 9 mg/dL (ref 8.9–10.3)
Chloride: 104 mmol/L (ref 98–111)
Creatinine, Ser: 0.83 mg/dL (ref 0.44–1.00)
GFR, Estimated: >60 mL/min (ref >60)
Glucose, Bld: 96 mg/dL (ref 70–99)
Potassium: 4.1 mmol/L (ref 3.5–5.1)
Sodium: 135 mmol/L (ref 135–145)
Total Bilirubin: 0.6 mg/dL (ref 0.3–1.2)
Total Protein: 7.1 g/dL (ref 6.5–8.1)

## 2023-02-25 LAB — CBC
HCT: 36.4 % (ref 36.0–46.0)
Hemoglobin: 11.4 g/dL — ABNORMAL LOW (ref 12.0–15.0)
MCH: 23.1 pg — ABNORMAL LOW (ref 26.0–34.0)
MCHC: 31.3 g/dL (ref 30.0–36.0)
MCV: 73.8 fL — ABNORMAL LOW (ref 80.0–100.0)
Platelets: 167 10*3/uL (ref 150–400)
RBC: 4.93 MIL/uL (ref 3.87–5.11)
RDW: 13.4 % (ref 11.5–15.5)
WBC: 4.9 10*3/uL (ref 4.0–10.5)
nRBC: 0 % (ref 0.0–0.2)

## 2023-02-25 LAB — LIPASE, BLOOD: Lipase: 37 U/L (ref 11–51)

## 2023-02-25 MED ORDER — IBUPROFEN 800 MG PO TABS
ORAL_TABLET | ORAL | Status: AC
  Filled 2023-02-25: qty 1

## 2023-02-25 MED ORDER — ONDANSETRON HCL 4 MG/2ML IJ SOLN
4.0000 mg | Freq: Once | INTRAMUSCULAR | Status: AC
  Administered 2023-02-25: 4 mg via INTRAMUSCULAR

## 2023-02-25 MED ORDER — ONDANSETRON HCL 4 MG/2ML IJ SOLN
INTRAMUSCULAR | Status: AC
  Filled 2023-02-25: qty 2

## 2023-02-25 MED ORDER — IBUPROFEN 800 MG PO TABS
800.0000 mg | ORAL_TABLET | Freq: Once | ORAL | Status: AC
  Administered 2023-02-25: 800 mg via ORAL

## 2023-02-25 MED ORDER — DOCUSATE SODIUM 100 MG PO CAPS: 100.0000 mg | ORAL_CAPSULE | Freq: Two times a day (BID) | ORAL | 0 refills | Status: DC

## 2023-02-25 MED ORDER — ONDANSETRON 4 MG PO TBDP
4.0000 mg | ORAL_TABLET | Freq: Once | ORAL | Status: AC | PRN
  Administered 2023-02-25: 4 mg via ORAL
  Filled 2023-02-25: qty 1

## 2023-02-25 MED ORDER — POLYETHYLENE GLYCOL 3350 17 GM/SCOOP PO POWD: 1.0000 | Freq: Once | ORAL | 0 refills | Status: AC

## 2023-02-25 MED ORDER — ONDANSETRON 4 MG PO TBDP: 4.0000 mg | ORAL_TABLET | Freq: Three times a day (TID) | ORAL | 0 refills | Status: DC | PRN

## 2023-02-25 MED ORDER — SULFAMETHOXAZOLE-TRIMETHOPRIM 800-160 MG PO TABS: 1.0000 | ORAL_TABLET | Freq: Two times a day (BID) | ORAL | 0 refills | Status: AC

## 2023-02-25 NOTE — Discharge Instructions (Addendum)
Your abdominal pain/physical exam findings are consistent with constipation. Start taking MiraLAX twice daily until you are able to have a soft normal bowel movement.  Once you are able to have a soft normal bowel movement, decrease the MiraLAX to once daily for the next 3 days then use as needed.  Increase the amount of fiber you are eating by eating more fruits, vegetables, and whole grains.  Increase your water intake to at least 8 cups of water per day to maintain good hydration.  Take Colace stool softener daily to prevent constipation in the future. You may purchase colace over the counter and take this once daily to help keep stools soft.   Your urine shows you likely have a urinary tract infection. I have sent your urine for culture to confirm this. We will go ahead and have you start taking antibiotics due to your symptoms.  Take antibiotic as directed.  (Bactrim twice daily for 3 days) If you develop diarrhea while taking this medication you may purchase an over-the-counter probiotic or eat yogurt with live active cultures.  To avoid GI upset please take this medication with food. I have sent your urine for culture to see what type of bacteria grows. We will call you if we need to change the treatment plan based on the results of your urine culture.  Zofran 4mg  every 8 hours as needed for nausea and vomiting. Purchase pedialyte and sip on this over the next few hours until you are able to keep liquids down. Then, increase diet to [employer] (bananas, rice, toast, applesauce).  If you have not had a bowel movement in the next 2 to 3 days, please return to urgent care.  If you develop any new or worsening symptoms that are severe, please go to the emergency room for further evaluation.  I hope you feel better!

## 2023-02-25 NOTE — ED Provider Notes (Signed)
[facility]    CSN: [id number] Arrival date & time: 02/25/23  1604      History   Chief Complaint Chief Complaint  Patient presents with   Abdominal Pain   Emesis    HPI Laura [name] is a 22 y.o. female.   Patient presents to urgent care for evaluation of nausea, vomiting, and pelvic pain that started 3 days ago.  Pelvic pain is currently a 4 on a scale of 0-10.  She is also currently nauseous but states that her symptoms worsened after she was given Zofran 4 mg ODT in the ER earlier this morning. She left prior to being seen in ER due to long wait time. Pelvic pain is currently a 4/10 and she complains of incomplete bladder emptying as well as urinary urgency. Denies dysuria, urinary frequency, flank pain, low back pain, dizziness, and fever/chills. Denies recent vaginal discharge outside of her normal and denies vaginal odor/itch. No recent new sexual partners or concern for STD. Denies dyspareunia. Reports last normal bowel movement was a few days ago and without blood/mucous to the stool. States BM was "hard and small". She has not used any OTC medications. Currently nauseous and states she has been able to keep down clear liquids since last episode of emesis earlier today.    Abdominal Pain Associated symptoms: vomiting   Emesis Associated symptoms: abdominal pain     Past Medical History:  Diagnosis Date   Anemia    Anxiety    Asthma    Depression    Genital herpes    Ovarian cyst     There are no problems to display for this patient.   Past Surgical History:  Procedure Laterality Date   NO PAST SURGERIES     OVARIAN CYST REMOVAL      OB History     Gravida  3   Para  2   Term  2   Preterm      AB      Living  2      SAB      IAB      Ectopic      Multiple      Live Births  2            Home Medications    Prior to Admission medications   Medication Sig Start Date End Date Taking? Authorizing Provider  docusate  sodium (COLACE) 100 MG capsule Take 1 capsule (100 mg total) by mouth every 12 (twelve) hours. 02/25/23  Yes Carlisle Beers, FNP  ondansetron (ZOFRAN-ODT) 4 MG disintegrating tablet Take 1 tablet (4 mg total) by mouth every 8 (eight) hours as needed for nausea or vomiting. 02/25/23  Yes Annell Canty, Donavan Burnet, FNP  polyethylene glycol powder (GLYCOLAX/MIRALAX) 17 GM/SCOOP powder Take 255 g by mouth once for 1 dose. 02/25/23 02/25/23 Yes Jabori Henegar, Donavan Burnet, FNP  sulfamethoxazole-trimethoprim (BACTRIM DS) 800-160 MG tablet Take 1 tablet by mouth 2 (two) times daily for 3 days. 02/25/23 02/28/23 Yes Carlisle Beers, FNP  albuterol (PROVENTIL) (2.5 MG/3ML) 0.083% nebulizer solution Take 3 mLs (2.5 mg total) by nebulization every 6 (six) hours as needed for wheezing or shortness of breath. 01/27/22   Roemhildt, Lorin T, PA-C  albuterol (VENTOLIN HFA) 108 (90 Base) MCG/ACT inhaler Inhale 1-2 puffs into the lungs every 6 (six) hours as needed for wheezing or shortness of breath.    [provider]  escitalopram (LEXAPRO) 10 MG tablet Take 5 mg by mouth at  bedtime. 06/17/22   [provider]    Family History Family History  Problem Relation Age of Onset   Epilepsy Father     Social History Social History   Tobacco Use   Smoking status: Former    Types: Cigarettes, Cigars    Quit date: 10/07/2018    Years since quitting: 4.3   Smokeless tobacco: Never  Vaping Use   Vaping Use: Former  Substance Use Topics   Alcohol use: No   Drug use: No     Allergies   Patient has no known allergies.   Review of Systems Review of Systems  Gastrointestinal:  Positive for abdominal pain and vomiting.  Per HPI   Physical Exam Triage Vital Signs ED Triage Vitals  Enc Vitals Group     BP 02/25/23 1731 112/74     Pulse Rate 02/25/23 1731 69     Resp 02/25/23 1731 14     Temp 02/25/23 1731 98.1 F (36.7 C)     Temp Source 02/25/23 1731 Oral     SpO2 02/25/23 1731 99 %      Weight --      Height --      Head Circumference --      Peak Flow --      Pain Score 02/25/23 1730 6     Pain Loc --      Pain Edu? --      Excl. in GC? --    No data found.  Updated Vital Signs BP 112/74 (BP Location: Right Arm)   Pulse 69   Temp 98.1 F (36.7 C) (Oral)   Resp 14   SpO2 99%   Visual Acuity Right Eye Distance:   Left Eye Distance:   Bilateral Distance:    Right Eye Near:   Left Eye Near:    Bilateral Near:     Physical Exam Vitals and nursing note reviewed.  Constitutional:      Appearance: She is ill-appearing. She is not toxic-appearing.  HENT:     Head: Normocephalic and atraumatic.     Right Ear: Hearing and external ear normal.     Left Ear: Hearing and external ear normal.     Nose: Nose normal.     Mouth/Throat:     Lips: Pink.     Mouth: Mucous membranes are moist. No injury.     Tongue: No lesions. Tongue does not deviate from midline.     Palate: No mass and lesions.     Pharynx: Oropharynx is clear. Uvula midline. No pharyngeal swelling, oropharyngeal exudate, posterior oropharyngeal erythema or uvula swelling.     Tonsils: No tonsillar exudate or tonsillar abscesses.  Eyes:     General: Lids are normal. Vision grossly intact. Gaze aligned appropriately.     Extraocular Movements: Extraocular movements intact.     Conjunctiva/sclera: Conjunctivae normal.  Cardiovascular:     Rate and Rhythm: Normal rate and regular rhythm.     Heart sounds: Normal heart sounds, S1 normal and S2 normal.  Pulmonary:     Effort: Pulmonary effort is normal. No respiratory distress.     Breath sounds: Normal breath sounds and air entry.  Abdominal:     General: Bowel sounds are normal.     Palpations: Abdomen is soft.     Tenderness: There is no abdominal tenderness. There is no right CVA tenderness, left CVA tenderness or guarding.  Musculoskeletal:     Cervical back: Neck supple.  Skin:  General: Skin is warm and dry.     Capillary Refill:  Capillary refill takes less than 2 seconds.     Findings: No rash.  Neurological:     General: No focal deficit present.     Mental Status: She is alert and oriented to person, place, and time. Mental status is at baseline.     Cranial Nerves: No dysarthria or facial asymmetry.  Psychiatric:        Mood and Affect: Mood normal.        Speech: Speech normal.        Behavior: Behavior normal.        Thought Content: Thought content normal.        Judgment: Judgment normal.      UC Treatments / Results  Labs (all labs ordered are listed, but only abnormal results are displayed) Labs Reviewed  POCT URINALYSIS DIP (MANUAL ENTRY) - Abnormal; Notable for the following components:      Result Value   Color, UA straw (*)    Clarity, UA cloudy (*)    Spec Grav, UA >=1.030 (*)    Blood, UA moderate (*)    Protein Ur, POC =100 (*)    Urobilinogen, UA 4.0 (*)    Leukocytes, UA Small (1+) (*)    All other components within normal limits  URINE CULTURE  CERVICOVAGINAL ANCILLARY ONLY    EKG   Radiology No results found.  Procedures Procedures (including critical care time)  Medications Ordered in UC Medications  ibuprofen (ADVIL) tablet 800 mg (800 mg Oral Given 02/25/23 1827)    Initial Impression / Assessment and Plan / UC Course  I have reviewed the triage vital signs and the nursing notes.  Pertinent labs & imaging results that were available during my care of the patient were reviewed by me and considered in my medical decision making (see chart for details).   1. Acute cystitis with hematuria, constipation Presentation is consistent with acute cystitis. Patient is nontoxic in appearance with hemodynamically stable vital signs. Low suspicion for acute pyelonephritis. Low suspicion for kidney stone or infected stone.  Reviewed blood work from ER visit this morning all of which is stable. Bactrim sent to pharmacy. Denies allergies to antibiotics. Urine culture pending. Patient  to push fluids to stay well hydrated and reduce intake of known urinary irritants. May use zofran 4mg  ODT every 8 hours as needed for nausea/vomiting.   2. Constipation Patient's symptoms are also likely related to constipation. Miralax QD for the next  few days until able to have normal BM, then as needed. Increased fiber intake encouraged. Advised increased fluid intake as well. No peritoneal signs to abdominal exam. Low suspicion for bowel blockage/acute abdomen, therefore deferred imaging/referral to ED.   Discussed physical exam and available lab work findings in clinic with patient.  Counseled patient regarding appropriate use of medications and potential side effects for all medications recommended or prescribed today. Discussed red flag signs and symptoms of worsening condition,when to call the PCP office, return to urgent care, and when to seek higher level of care in the emergency department. Patient verbalizes understanding and agreement with plan. All questions answered. Patient discharged in stable condition.    Final Clinical Impressions(s) / UC Diagnoses   Final diagnoses:  Acute cystitis with hematuria  Constipation, unspecified constipation type     Discharge Instructions      Your abdominal pain/physical exam findings are consistent with constipation. Start taking MiraLAX twice daily until you  are able to have a soft normal bowel movement.  Once you are able to have a soft normal bowel movement, decrease the MiraLAX to once daily for the next 3 days then use as needed.  Increase the amount of fiber you are eating by eating more fruits, vegetables, and whole grains.  Increase your water intake to at least 8 cups of water per day to maintain good hydration.  Take Colace stool softener daily to prevent constipation in the future. You may purchase colace over the counter and take this once daily to help keep stools soft.   Your urine shows you likely have a urinary tract  infection. I have sent your urine for culture to confirm this. We will go ahead and have you start taking antibiotics due to your symptoms.  Take antibiotic as directed.  (Bactrim twice daily for 3 days) If you develop diarrhea while taking this medication you may purchase an over-the-counter probiotic or eat yogurt with live active cultures.  To avoid GI upset please take this medication with food. I have sent your urine for culture to see what type of bacteria grows. We will call you if we need to change the treatment plan based on the results of your urine culture.  Zofran 4mg  every 8 hours as needed for nausea and vomiting. Purchase pedialyte and sip on this over the next few hours until you are able to keep liquids down. Then, increase diet to [employer] (bananas, rice, toast, applesauce).  If you have not had a bowel movement in the next 2 to 3 days, please return to urgent care.  If you develop any new or worsening symptoms that are severe, please go to the emergency room for further evaluation.  I hope you feel better!     ED Prescriptions     Medication Sig Dispense Auth. Provider   sulfamethoxazole-trimethoprim (BACTRIM DS) 800-160 MG tablet Take 1 tablet by mouth 2 (two) times daily for 3 days. 6 tablet Carlisle Beers, FNP   docusate sodium (COLACE) 100 MG capsule Take 1 capsule (100 mg total) by mouth every 12 (twelve) hours. 60 capsule Reita May M, FNP   ondansetron (ZOFRAN-ODT) 4 MG disintegrating tablet Take 1 tablet (4 mg total) by mouth every 8 (eight) hours as needed for nausea or vomiting. 20 tablet Carlisle Beers, FNP   polyethylene glycol powder (GLYCOLAX/MIRALAX) 17 GM/SCOOP powder Take 255 g by mouth once for 1 dose. 255 g Carlisle Beers, FNP      PDMP not reviewed this encounter.   Carlisle Beers, [place] 03/03/23 2218

## 2023-02-25 NOTE — ED Triage Notes (Signed)
Pt reports having abd cramping with n/v for 3 days. Pt reports urinating small amounts and is very painful that has been ongoing "for a while".   Took tried Zofran for n/v but made stomach hurt really bad.   Went to ED early this am for these complaints but left due to wait times being so long. Pt had UA, CMP, CBC, Lipase, and istat-hcg done at ED.

## 2023-02-25 NOTE — ED Triage Notes (Signed)
Patient reports abdominal cramping across abdomen with emesis onset 2 days ago , no diarrhea or fever .

## 2023-02-25 NOTE — ED Notes (Signed)
Pt called for a room with no answer x2. 

## 2023-02-26 ENCOUNTER — Telehealth (HOSPITAL_COMMUNITY): Payer: Self-pay | Admitting: Emergency Medicine

## 2023-02-26 LAB — CERVICOVAGINAL ANCILLARY ONLY
Bacterial Vaginitis (gardnerella): NEGATIVE
Candida Glabrata: NEGATIVE
Candida Vaginitis: POSITIVE — AB
Chlamydia: NEGATIVE
Comment: NEGATIVE
Comment: NEGATIVE
Comment: NEGATIVE
Comment: NEGATIVE
Comment: NEGATIVE
Comment: NORMAL
Neisseria Gonorrhea: NEGATIVE
Trichomonas: NEGATIVE

## 2023-02-26 LAB — URINE CULTURE

## 2023-02-26 MED ORDER — FLUCONAZOLE 150 MG PO TABS: 150.0000 mg | ORAL_TABLET | Freq: Once | ORAL | 0 refills | Status: AC

## 2023-02-27 LAB — URINE CULTURE: Culture: >=100000 — AB

## 2023-03-15 DIAGNOSIS — F32A Depression, unspecified: Secondary | ICD-10-CM | POA: Insufficient documentation

## 2023-03-31 ENCOUNTER — Ambulatory Visit (HOSPITAL_COMMUNITY)
Admission: RE | Admit: 2023-03-31 | Discharge: 2023-03-31 | Disposition: A | Discharge status: Home / Self Care | Payer: Medicaid Other | Source: Ambulatory Visit | Attending: Physician Assistant | Admitting: Physician Assistant

## 2023-03-31 ENCOUNTER — Encounter (HOSPITAL_COMMUNITY): Payer: Self-pay

## 2023-03-31 ENCOUNTER — Other Ambulatory Visit: Payer: Self-pay

## 2023-03-31 VITALS — BP 122/75 | HR 75 | Temp 98.7°F | Resp 18 | Ht 63.0 in | Wt 132.3 lb

## 2023-03-31 DIAGNOSIS — Z202 Contact with and (suspected) exposure to infections with a predominantly sexual mode of transmission: Secondary | ICD-10-CM | POA: Diagnosis not present

## 2023-03-31 DIAGNOSIS — B3731 Acute candidiasis of vulva and vagina: Secondary | ICD-10-CM | POA: Diagnosis present

## 2023-03-31 LAB — HIV ANTIBODY (ROUTINE TESTING W REFLEX): HIV Screen 4th Generation wRfx: NONREACTIVE

## 2023-03-31 MED ORDER — FLUCONAZOLE 150 MG PO TABS: 150.0000 mg | ORAL_TABLET | Freq: Every day | ORAL | 0 refills | Status: DC

## 2023-03-31 NOTE — Discharge Instructions (Addendum)
Take Diflucan as instructed. Will call with test results and change treatment plan if indicated. Abstain from sexual activity until test results are back and you have completed any necessary treatment.  Abstain until sexual partner has also been tested and symptoms resolved.

## 2023-03-31 NOTE — ED Provider Notes (Signed)
[facility]    CSN: [id number] Arrival date & time: 03/31/23  1618      History   Chief Complaint Chief Complaint  Patient presents with   SEXUALLY TRANSMITTED DISEASE    I need hiv /std testing - Entered by patient   Exposure to STD    HPI Laura [name] is a 22 y.o. female.   Patient presents with vaginal itching that started 3 days ago.  She denies vaginal discharge.  She denies pelvic pain, dysuria, fever, chills, flank pain.  She reports her partner is experiencing penile discharge.  He has not been tested for STDs.  She is requesting STD testing along with HIV and RPR here today.  She has tried nothing for the vaginal itching.    Past Medical History:  Diagnosis Date   Anemia    Anxiety    Asthma    Depression    Genital herpes    Ovarian cyst     There are no problems to display for this patient.   Past Surgical History:  Procedure Laterality Date   NO PAST SURGERIES     OVARIAN CYST REMOVAL      OB History     Gravida  3   Para  2   Term  2   Preterm      AB      Living  2      SAB      IAB      Ectopic      Multiple      Live Births  2            Home Medications    Prior to Admission medications   Medication Sig Start Date End Date Taking? Authorizing Provider  fluconazole (DIFLUCAN) 150 MG tablet Take 1 tablet (150 mg total) by mouth daily. 03/31/23  Yes Ward, Tylene Fantasia, PA-C  albuterol (PROVENTIL) (2.5 MG/3ML) 0.083% nebulizer solution Take 3 mLs (2.5 mg total) by nebulization every 6 (six) hours as needed for wheezing or shortness of breath. 01/27/22   Roemhildt, Lorin T, PA-C  albuterol (VENTOLIN HFA) 108 (90 Base) MCG/ACT inhaler Inhale 1-2 puffs into the lungs every 6 (six) hours as needed for wheezing or shortness of breath.    [provider]  docusate sodium (COLACE) 100 MG capsule Take 1 capsule (100 mg total) by mouth every 12 (twelve) hours. 02/25/23   Carlisle Beers, FNP  escitalopram  (LEXAPRO) 10 MG tablet Take 5 mg by mouth at bedtime. 06/17/22   [provider]  ondansetron (ZOFRAN-ODT) 4 MG disintegrating tablet Take 1 tablet (4 mg total) by mouth every 8 (eight) hours as needed for nausea or vomiting. 02/25/23   Carlisle Beers, FNP    Family History Family History  Problem Relation Age of Onset   Epilepsy Father     Social History Social History   Tobacco Use   Smoking status: Former    Types: Cigarettes, Cigars    Quit date: 10/07/2018    Years since quitting: 4.4   Smokeless tobacco: Never  Vaping Use   Vaping Use: Former  Substance Use Topics   Alcohol use: No   Drug use: No     Allergies   Patient has no known allergies.   Review of Systems Review of Systems  Constitutional:  Negative for chills and fever.  HENT:  Negative for ear pain and sore throat.   Eyes:  Negative for pain and visual disturbance.  Respiratory:  Negative for cough and shortness of breath.   Cardiovascular:  Negative for chest pain and palpitations.  Gastrointestinal:  Negative for abdominal pain and vomiting.  Genitourinary:  Negative for dysuria and hematuria.       Vaginal itching  Musculoskeletal:  Negative for arthralgias and back pain.  Skin:  Negative for color change and rash.  Neurological:  Negative for seizures and syncope.  All other systems reviewed and are negative.    Physical Exam Triage Vital Signs ED Triage Vitals [03/31/23 1630]  Enc Vitals Group     BP 122/75     Pulse Rate 75     Resp 18     Temp 98.7 F (37.1 C)     Temp Source Oral     SpO2 96 %     Weight 132 lb 4.4 oz (60 kg)     Height 5\' 3"  (1.6 m)     Head Circumference      Peak Flow      Pain Score 0     Pain Loc      Pain Edu?      Excl. in GC?    No data found.  Updated Vital Signs BP 122/75 (BP Location: Right Arm)   Pulse 75   Temp 98.7 F (37.1 C) (Oral)   Resp 18   Ht 5\' 3"  (1.6 m)   Wt 132 lb 4.4 oz (60 kg)   SpO2 96%   BMI 23.43 kg/m    Visual Acuity Right Eye Distance:   Left Eye Distance:   Bilateral Distance:    Right Eye Near:   Left Eye Near:    Bilateral Near:     Physical Exam Vitals and nursing note reviewed.  Constitutional:      General: She is not in acute distress.    Appearance: She is well-developed.  HENT:     Head: Normocephalic and atraumatic.  Eyes:     Conjunctiva/sclera: Conjunctivae normal.  Cardiovascular:     Rate and Rhythm: Normal rate and regular rhythm.     Heart sounds: No murmur heard. Pulmonary:     Effort: Pulmonary effort is normal. No respiratory distress.     Breath sounds: Normal breath sounds.  Abdominal:     Palpations: Abdomen is soft.     Tenderness: There is no abdominal tenderness.  Musculoskeletal:        General: No swelling.     Cervical back: Neck supple.  Skin:    General: Skin is warm and dry.     Capillary Refill: Capillary refill takes less than 2 seconds.  Neurological:     Mental Status: She is alert.  Psychiatric:        Mood and Affect: Mood normal.      UC Treatments / Results  Labs (all labs ordered are listed, but only abnormal results are displayed) Labs Reviewed  HIV ANTIBODY (ROUTINE TESTING W REFLEX)  RPR  CERVICOVAGINAL ANCILLARY ONLY    EKG   Radiology No results found.  Procedures Procedures (including critical care time)  Medications Ordered in UC Medications - No data to display  Initial Impression / Assessment and Plan / UC Course  I have reviewed the triage vital signs and the nursing notes.  Pertinent labs & imaging results that were available during my care of the patient were reviewed by me and considered in my medical decision making (see chart for details).     Patient experiencing vaginal itching.  Denies increased vaginal discharge, pelvic pain, dysuria or urinary symptoms, flank pain.  Reports partner is experiencing penile discharge.  He has not been tested at this time.  Will treat for vaginal yeast  given vaginal itching.  Cervicovaginal self swab in clinic today, pending results.  Will change treatment plan if indicated based on results.  Safe sex practices discussed.  Return precautions discussed. Final Clinical Impressions(s) / UC Diagnoses   Final diagnoses:  Vaginal yeast infection  Possible exposure to STD     Discharge Instructions      Take Diflucan as instructed. Will call with test results and change treatment plan if indicated. Abstain from sexual activity until test results are back and you have completed any necessary treatment.  Abstain until sexual partner has also been tested and symptoms resolved.      ED Prescriptions     Medication Sig Dispense Auth. Provider   fluconazole (DIFLUCAN) 150 MG tablet Take 1 tablet (150 mg total) by mouth daily. 1 tablet Ward, Tylene Fantasia, PA-C      PDMP not reviewed this encounter.   Ward, Tylene Fantasia, PA-C 03/31/23 [phone]

## 2023-03-31 NOTE — ED Triage Notes (Signed)
Pt is requesting to be check for STD and HIV.

## 2023-04-01 LAB — CERVICOVAGINAL ANCILLARY ONLY
Bacterial Vaginitis (gardnerella): POSITIVE — AB
Candida Glabrata: NEGATIVE
Candida Vaginitis: POSITIVE — AB
Chlamydia: NEGATIVE
Comment: NEGATIVE
Comment: NEGATIVE
Comment: NEGATIVE
Comment: NEGATIVE
Comment: NEGATIVE
Comment: NORMAL
Neisseria Gonorrhea: NEGATIVE
Trichomonas: NEGATIVE

## 2023-04-01 LAB — RPR: RPR Ser Ql: NONREACTIVE

## 2023-04-02 ENCOUNTER — Telehealth (HOSPITAL_COMMUNITY): Payer: Self-pay | Admitting: Emergency Medicine

## 2023-04-02 MED ORDER — METRONIDAZOLE 500 MG PO TABS: 500.0000 mg | ORAL_TABLET | Freq: Two times a day (BID) | ORAL | 0 refills | Status: DC

## 2023-04-07 ENCOUNTER — Other Ambulatory Visit: Payer: Self-pay

## 2023-04-07 ENCOUNTER — Encounter (HOSPITAL_BASED_OUTPATIENT_CLINIC_OR_DEPARTMENT_OTHER): Payer: Self-pay | Admitting: Emergency Medicine

## 2023-04-07 ENCOUNTER — Ambulatory Visit (HOSPITAL_COMMUNITY): Payer: Self-pay

## 2023-04-07 ENCOUNTER — Emergency Department (HOSPITAL_BASED_OUTPATIENT_CLINIC_OR_DEPARTMENT_OTHER)
Admission: EM | Admit: 2023-04-07 | Discharge: 2023-04-07 | Disposition: A | Discharge status: Home / Self Care | Payer: Medicaid Other | Attending: Emergency Medicine | Admitting: Emergency Medicine

## 2023-04-07 DIAGNOSIS — J020 Streptococcal pharyngitis: Secondary | ICD-10-CM | POA: Insufficient documentation

## 2023-04-07 DIAGNOSIS — M791 Myalgia, unspecified site: Secondary | ICD-10-CM | POA: Diagnosis not present

## 2023-04-07 DIAGNOSIS — Z20822 Contact with and (suspected) exposure to covid-19: Secondary | ICD-10-CM | POA: Diagnosis not present

## 2023-04-07 DIAGNOSIS — J029 Acute pharyngitis, unspecified: Secondary | ICD-10-CM | POA: Diagnosis present

## 2023-04-07 LAB — RESP PANEL BY RT-PCR (RSV, FLU A&B, COVID)  RVPGX2
Influenza A by PCR: NEGATIVE
Influenza B by PCR: NEGATIVE
Resp Syncytial Virus by PCR: NEGATIVE
SARS Coronavirus 2 by RT PCR: NEGATIVE

## 2023-04-07 LAB — GROUP A STREP BY PCR: Group A Strep by PCR: DETECTED — AB

## 2023-04-07 MED ORDER — ACETAMINOPHEN 325 MG PO TABS
650.0000 mg | ORAL_TABLET | Freq: Once | ORAL | Status: AC | PRN
  Administered 2023-04-07: 650 mg via ORAL
  Filled 2023-04-07: qty 2

## 2023-04-07 MED ORDER — PENICILLIN G BENZATHINE 1200000 UNIT/2ML IM SUSY
1.2000 10*6.[IU] | PREFILLED_SYRINGE | Freq: Once | INTRAMUSCULAR | Status: AC
  Administered 2023-04-07: 1.2 10*6.[IU] via INTRAMUSCULAR
  Filled 2023-04-07: qty 2

## 2023-04-07 MED ORDER — KETOROLAC TROMETHAMINE 15 MG/ML IJ SOLN
30.0000 mg | Freq: Once | INTRAMUSCULAR | Status: AC
  Administered 2023-04-07: 30 mg via INTRAMUSCULAR
  Filled 2023-04-07: qty 2

## 2023-04-07 NOTE — Discharge Instructions (Signed)
Please read and follow all provided instructions.  Your diagnoses today include:  1. Acute streptococcal pharyngitis     Tests performed today include: Strep test: was POSITIVE for strep throat COVID, flu, RSV testing were negative Vital signs. See below for your results today.   Medications prescribed:  You were given a one-time shot of penicillin to treat your strep throat.   You are also given Toradol for pain and inflammation.  Take any medications prescribed only as directed.   Home care instructions:  Please read the educational materials provided and follow any instructions contained in this packet.  Follow-up instructions: Please follow-up with your primary care provider as needed for further evaluation of your symptoms.  Return instructions:  Please return to the Emergency Department if you experience worsening symptoms.  Return if you have worsening problems swallowing, your neck becomes swollen, you cannot swallow your saliva or your voice becomes muffled.  Return with high persistent fever, persistent vomiting, or if you have trouble breathing.  Please return if you have any other emergent concerns.  Additional Information:  Your vital signs today were: BP 109/85 (BP Location: Right Arm)   Pulse (!) 114   Temp 100 F (37.8 C) (Oral)   Resp 20   SpO2 99%  If your blood pressure (BP) was elevated above 135/85 this visit, please have this repeated by your doctor within one month. --------------

## 2023-04-07 NOTE — ED Provider Notes (Signed)
EMERGENCY DEPARTMENT AT [facility] Provider Note   CSN: [id number] Arrival date & time: 04/07/23  1503     History  Chief Complaint  Patient presents with   Sore Throat    Laura [name] is a 22 y.o. female.  Patient with no significant past medical history presents to the emergency department for evaluation of sore throat, body aches and chills starting 2 days ago.  No ear pain, runny nose.  No nausea or vomiting.  No chest pain, cough, or shortness of breath.  Temperature 100 F on arrival.  She has been taking Tylenol at home with some improvement.  No known sick contacts.       Home Medications Prior to Admission medications   Medication Sig Start Date End Date Taking? Authorizing Provider  albuterol (PROVENTIL) (2.5 MG/3ML) 0.083% nebulizer solution Take 3 mLs (2.5 mg total) by nebulization every 6 (six) hours as needed for wheezing or shortness of breath. 01/27/22   Roemhildt, Lorin T, PA-C  albuterol (VENTOLIN HFA) 108 (90 Base) MCG/ACT inhaler Inhale 1-2 puffs into the lungs every 6 (six) hours as needed for wheezing or shortness of breath.    [provider]  docusate sodium (COLACE) 100 MG capsule Take 1 capsule (100 mg total) by mouth every 12 (twelve) hours. 02/25/23   Carlisle Beers, FNP  escitalopram (LEXAPRO) 10 MG tablet Take 5 mg by mouth at bedtime. 06/17/22   [provider]  fluconazole (DIFLUCAN) 150 MG tablet Take 1 tablet (150 mg total) by mouth daily. 03/31/23   Ward, Tylene Fantasia, PA-C  metroNIDAZOLE (FLAGYL) 500 MG tablet Take 1 tablet (500 mg total) by mouth 2 (two) times daily. 04/02/23   Lamptey, Britta Mccreedy, MD  ondansetron (ZOFRAN-ODT) 4 MG disintegrating tablet Take 1 tablet (4 mg total) by mouth every 8 (eight) hours as needed for nausea or vomiting. 02/25/23   Carlisle Beers, FNP      Allergies    Patient has no known allergies.    Review of Systems   Review of Systems  Physical Exam Updated Vital  Signs BP 109/85 (BP Location: Right Arm)   Pulse (!) 114   Temp 100 F (37.8 C) (Oral)   Resp 20   SpO2 99%  Physical Exam Vitals and nursing note reviewed.  Constitutional:      Appearance: She is well-developed.  HENT:     Head: Normocephalic and atraumatic.     Jaw: No trismus.     Right Ear: Tympanic membrane, ear canal and external ear normal. No tenderness.     Left Ear: Tympanic membrane, ear canal and external ear normal. No tenderness.     Nose: Nose normal. No mucosal edema or rhinorrhea.     Mouth/Throat:     Mouth: Mucous membranes are moist. Mucous membranes are not dry. No oral lesions.     Pharynx: Uvula midline. Posterior oropharyngeal erythema present. No oropharyngeal exudate or uvula swelling.     Tonsils: No tonsillar abscesses.     Comments: No abscess visualized Eyes:     General:        Right eye: No discharge.        Left eye: No discharge.     Conjunctiva/sclera: Conjunctivae normal.  Cardiovascular:     Rate and Rhythm: Normal rate and regular rhythm.     Heart sounds: Normal heart sounds.  Pulmonary:     Effort: Pulmonary effort is normal. No respiratory distress.     Breath  sounds: Normal breath sounds. No wheezing or rales.  Abdominal:     Palpations: Abdomen is soft.     Tenderness: There is no abdominal tenderness.  Musculoskeletal:     Cervical back: Normal range of motion and neck supple.  Lymphadenopathy:     Cervical: No cervical adenopathy.  Skin:    General: Skin is warm and dry.  Neurological:     Mental Status: She is alert.  Psychiatric:        Mood and Affect: Mood normal.     ED Results / Procedures / Treatments   Labs (all labs ordered are listed, but only abnormal results are displayed) Labs Reviewed  GROUP A STREP BY PCR - Abnormal; Notable for the following components:      Result Value   Group A Strep by PCR DETECTED (*)    All other components within normal limits  RESP PANEL BY RT-PCR (RSV, FLU A&B, COVID)   [id number]    EKG None  Radiology No results found.  Procedures Procedures    Medications Ordered in ED Medications  penicillin g benzathine (BICILLIN LA) 1200000 UNIT/2ML injection 1.2 Million Units (has no administration in time range)  ketorolac (TORADOL) 15 MG/ML injection 30 mg (has no administration in time range)  acetaminophen (TYLENOL) tablet 650 mg (650 mg Oral Given 04/07/23 1537)    ED Course/ Medical Decision Making/ A&P    Patient seen and examined. History obtained directly from patient. Work-up including labs, imaging, EKG ordered in triage, if performed, were reviewed.    Labs/EKG: Independently reviewed and interpreted.  This included: Strep positive, negative COVID, flu, RSV.  Imaging: None ordered  Medications/Fluids: Ordered: IM Bicillin, IM Toradol.  Most recent vital signs reviewed and are as follows: BP 109/85 (BP Location: Right Arm)   Pulse (!) 114   Temp 100 F (37.8 C) (Oral)   Resp 20   SpO2 99%   Initial impression: Streptococcal pharyngitis  Home treatment plan: OTC meds, maintain good hydration  Return instructions discussed with patient: Difficulty with swallowing, trouble breathing, new symptoms or other concerns  Follow-up instructions discussed with patient: Follow-up with PCP in 5 days if needed.                            Medical Decision Making Risk OTC drugs. Prescription drug management.   In regards to the patient's sore throat today, the following dangerous and potentially life threatening etiologies were considered on the differential diagnosis: Lugwig's angina, uvulitis, epiglottis, peritonsillar abscess, retropharyngeal abscess, Lemierre's syndrome. Also considered were more common causes such as: streptococcal pharyngitis, gonococcal pharyngitis, non-bacterial pharyngitis (cold viruses, HSV/coxsackievirus, influenza, COVID-19, infectious mononucleosis, oropharyngeal candidiasis), and other non-infectious causes including  seasonal allergies/post-nasal drip, GERD/esophagitis, trauma.   The patient's vital signs, pertinent lab work and imaging were reviewed and interpreted as discussed in the ED course. Hospitalization was considered for further testing, treatments, or serial exams/observation. However as patient is well-appearing, has a stable exam, and reassuring studies today, I do not feel that they warrant admission at this time. This plan was discussed with the patient who verbalizes agreement and comfort with this plan and seems reliable and able to return to the Emergency Department with worsening or changing symptoms.          Final Clinical Impression(s) / ED Diagnoses Final diagnoses:  Acute streptococcal pharyngitis    Rx / DC Orders ED Discharge Orders     None  Renne Crigler, PA-C 04/07/23 1749    Arby Barrette, MD 04/09/23 1356

## 2023-04-07 NOTE — ED Triage Notes (Signed)
Sore throat body aches chills x 2 day Last tylenol early AM

## 2023-04-23 ENCOUNTER — Ambulatory Visit (HOSPITAL_COMMUNITY)
Admission: RE | Admit: 2023-04-23 | Discharge: 2023-04-23 | Disposition: A | Discharge status: Home / Self Care | Payer: 59 | Source: Ambulatory Visit | Attending: Family Medicine | Admitting: Family Medicine

## 2023-04-23 ENCOUNTER — Encounter (HOSPITAL_COMMUNITY): Payer: Self-pay

## 2023-04-23 VITALS — BP 114/82 | HR 68 | Temp 98.2°F | Resp 16

## 2023-04-23 DIAGNOSIS — N760 Acute vaginitis: Secondary | ICD-10-CM | POA: Insufficient documentation

## 2023-04-23 MED ORDER — METRONIDAZOLE 500 MG PO TABS: 500.0000 mg | ORAL_TABLET | Freq: Two times a day (BID) | ORAL | 0 refills | Status: AC

## 2023-04-23 NOTE — ED Triage Notes (Signed)
Pt states she has increased vaginal discharge and fishy odor x 4-5 days. She hasn't tried any meds OTC

## 2023-04-23 NOTE — ED Provider Notes (Signed)
[facility]    CSN: [id number] Arrival date & time: 04/23/23  1929      History   Chief Complaint Chief Complaint  Patient presents with   Vaginal Discharge    I believe I have a very bad yeast infection - Entered by patient    HPI Laura [name] is a 22 y.o. female.    Vaginal Discharge Here for a fishy vaginal odor that began about 4 5 days ago.  No abdominal pain and no itching and no fever or vomiting  The first of this month she was tested positive for BV and yeast.  She states she did not finish all her metronidazole pills  No allergies to medications Her periods are irregular due to an IUD   Past Medical History:  Diagnosis Date   Anemia    Anxiety    Asthma    Depression    Genital herpes    Ovarian cyst     There are no problems to display for this patient.   Past Surgical History:  Procedure Laterality Date   NO PAST SURGERIES     OVARIAN CYST REMOVAL      OB History     Gravida  3   Para  2   Term  2   Preterm      AB      Living  2      SAB      IAB      Ectopic      Multiple      Live Births  2            Home Medications    Prior to Admission medications   Medication Sig Start Date End Date Taking? Authorizing Provider  metroNIDAZOLE (FLAGYL) 500 MG tablet Take 1 tablet (500 mg total) by mouth 2 (two) times daily for 7 days. 04/23/23 04/30/23 Yes Zenia Resides, MD  sertraline (ZOLOFT) 100 MG tablet sertraline 100 mg tablet   Yes [provider]  albuterol (PROVENTIL) (2.5 MG/3ML) 0.083% nebulizer solution Take 3 mLs (2.5 mg total) by nebulization every 6 (six) hours as needed for wheezing or shortness of breath. 01/27/22   Roemhildt, Lorin T, PA-C  albuterol (VENTOLIN HFA) 108 (90 Base) MCG/ACT inhaler Inhale 1-2 puffs into the lungs every 6 (six) hours as needed for wheezing or shortness of breath.    [provider]  docusate sodium (COLACE) 100 MG capsule Take 1 capsule (100 mg  total) by mouth every 12 (twelve) hours. 02/25/23   Carlisle Beers, FNP  escitalopram (LEXAPRO) 10 MG tablet Take 5 mg by mouth at bedtime. 06/17/22   [provider]  levonorgestrel (MIRENA, 52 MG,) 20 MCG/DAY IUD to be inserted by provider in office at postpartum visit ICD 10 Z30.42 10/22/21   [provider]    Family History Family History  Problem Relation Age of Onset   Epilepsy Father     Social History Social History   Tobacco Use   Smoking status: Former    Current packs/day: 0.00    Types: Cigarettes, Cigars    Quit date: 10/07/2018    Years since quitting: 4.5   Smokeless tobacco: Never  Vaping Use   Vaping status: Former  Substance Use Topics   Alcohol use: No   Drug use: No     Allergies   Patient has no known allergies.   Review of Systems Review of Systems  Genitourinary:  Positive for vaginal discharge.  Physical Exam Triage Vital Signs ED Triage Vitals  Encounter Vitals Group     BP 04/23/23 2000 114/82     Systolic BP Percentile --      Diastolic BP Percentile --      Pulse Rate 04/23/23 2000 68     Resp 04/23/23 2000 16     Temp 04/23/23 2000 98.2 F (36.8 C)     Temp Source 04/23/23 2000 Oral     SpO2 04/23/23 2000 98 %     Weight --      Height --      Head Circumference --      Peak Flow --      Pain Score 04/23/23 1958 0     Pain Loc --      Pain Education --      Exclude from Growth Chart --    No data found.  Updated Vital Signs BP 114/82 (BP Location: Right Arm)   Pulse 68   Temp 98.2 F (36.8 C) (Oral)   Resp 16   SpO2 98%   Visual Acuity Right Eye Distance:   Left Eye Distance:   Bilateral Distance:    Right Eye Near:   Left Eye Near:    Bilateral Near:     Physical Exam Vitals reviewed.  Constitutional:      General: She is not in acute distress.    Appearance: She is not ill-appearing, toxic-appearing or diaphoretic.  Skin:    Coloration: Skin is not pale.  Neurological:      Mental Status: She is alert and oriented to person, place, and time.  Psychiatric:        Behavior: Behavior normal.      UC Treatments / Results  Labs (all labs ordered are listed, but only abnormal results are displayed) Labs Reviewed  CERVICOVAGINAL ANCILLARY ONLY    EKG   Radiology No results found.  Procedures Procedures (including critical care time)  Medications Ordered in UC Medications - No data to display  Initial Impression / Assessment and Plan / UC Course  I have reviewed the triage vital signs and the nursing notes.  Pertinent labs & imaging results that were available during my care of the patient were reviewed by me and considered in my medical decision making (see chart for details).     Empiric treatment is sent in from with metronidazole for possible BV.  Vaginal self swab is done, and we will notify of any positives on that and treat per protocol.  Final Clinical Impressions(s) / UC Diagnoses   Final diagnoses:  Acute vaginitis     Discharge Instructions      Staff will notify you if there is anything positive on the swab.  Take metronidazole 500 mg--1 tablet 2 times daily for 7 days.  Avoid drinking alcohol within 72 hours of taking this medication      ED Prescriptions     Medication Sig Dispense Auth. Provider   metroNIDAZOLE (FLAGYL) 500 MG tablet Take 1 tablet (500 mg total) by mouth 2 (two) times daily for 7 days. 14 tablet Laron Boorman, Janace Aris, MD      PDMP not reviewed this encounter.   Zenia Resides, MD 04/23/23 [phone]

## 2023-04-23 NOTE — Discharge Instructions (Addendum)
Staff will notify you if there is anything positive on the swab  Take metronidazole 500 mg--1 tablet 2 times daily for 7 days.  Avoid drinking alcohol within 72 hours of taking this medication

## 2023-04-24 LAB — CERVICOVAGINAL ANCILLARY ONLY: Neisseria Gonorrhea: NEGATIVE

## 2023-04-27 ENCOUNTER — Telehealth: Payer: Self-pay

## 2023-04-27 MED ORDER — FLUCONAZOLE 150 MG PO TABS: 150.0000 mg | ORAL_TABLET | Freq: Once | ORAL | 0 refills | Status: AC

## 2023-04-27 NOTE — Telephone Encounter (Signed)
Pt went home on metronidazole. Per protocol, pt also requires tx with Diflucan. Reviewed with patient, verified pharmacy, prescription sent.

## 2023-05-25 ENCOUNTER — Ambulatory Visit (HOSPITAL_COMMUNITY)
Admission: RE | Admit: 2023-05-25 | Discharge: 2023-05-25 | Disposition: A | Discharge status: Home / Self Care | Payer: 59 | Source: Ambulatory Visit | Attending: Family Medicine | Admitting: Family Medicine

## 2023-05-25 ENCOUNTER — Encounter (HOSPITAL_COMMUNITY): Payer: Self-pay

## 2023-05-25 VITALS — BP 102/70 | HR 80 | Temp 98.4°F | Resp 16

## 2023-05-25 DIAGNOSIS — N898 Other specified noninflammatory disorders of vagina: Secondary | ICD-10-CM | POA: Diagnosis not present

## 2023-05-25 MED ORDER — FLUCONAZOLE 150 MG PO TABS: ORAL_TABLET | ORAL | 0 refills | Status: DC

## 2023-05-25 NOTE — Discharge Instructions (Signed)
We have sent testing for various causes of vaginal infections. We will notify you of any positive results once they are received. If required, we will prescribe any medications you might need.  Please refrain from all sexual activity for at least the next seven days.  

## 2023-05-25 NOTE — ED Provider Notes (Signed)
[facility]   [id number] 05/25/23 Arrival Time: 6295  ASSESSMENT & PLAN:  1. Vaginal discharge    Meds ordered this encounter  Medications   fluconazole (DIFLUCAN) 150 MG tablet    Sig: Take one tablet by mouth as a single dose. May repeat in 3 days if symptoms persist.    Dispense:  2 tablet    Refill:  0      Discharge Instructions      We have sent testing for various causes of vaginal infections. We will notify you of any positive results once they are received. If required, we will prescribe any medications you might need.  Please refrain from all sexual activity for at least the next seven days.     Without s/s of PID.  Labs Reviewed  CERVICOVAGINAL ANCILLARY ONLY    Will notify of any positive results. Instructed to refrain from sexual activity for at least seven days.  Reviewed expectations re: course of current medical issues. Questions answered. Outlined signs and symptoms indicating need for more acute intervention. Patient verbalized understanding. After Visit Summary given.   SUBJECTIVE:  Laura [name] is a 22 y.o. female who presents with complaint of vaginal discharge; 3-4 days. H/O yeast infections and "feels like it". Denies abd/pelvic pain. Denies urinary symptoms. Agrees to STD testing.  No LMP recorded. (Menstrual status: IUD).   OBJECTIVE:  Vitals:   05/25/23 0911  BP: 102/70  Pulse: 80  Resp: 16  Temp: 98.4 F (36.9 C)  TempSrc: Oral  SpO2: 98%     General appearance: alert, cooperative, appears stated age and no distress Lungs: unlabored respirations; speaks full sentences without difficulty Back: no CVA tenderness; FROM at waist Abdomen: soft, non-tender GU: deferred Skin: warm and dry Psychological: alert and cooperative; normal mood and affect.    Labs Reviewed  CERVICOVAGINAL ANCILLARY ONLY    No Known Allergies  Past Medical History:  Diagnosis Date   Anemia    Anxiety    Asthma    Depression     Genital herpes    Ovarian cyst    Family History  Problem Relation Age of Onset   Epilepsy Father    Social History   Socioeconomic History   Marital status: Single    Spouse name: Not on file   Number of children: Not on file   Years of education: Not on file   Highest education level: Not on file  Occupational History   Not on file  Tobacco Use   Smoking status: Former    Current packs/day: 0.00    Types: Cigarettes, Cigars    Quit date: 10/07/2018    Years since quitting: 4.6   Smokeless tobacco: Never  Vaping Use   Vaping status: Former  Substance and Sexual Activity   Alcohol use: No   Drug use: No   Sexual activity: Yes    Birth control/protection: I.U.D.  Other Topics Concern   Not on file  Social History Narrative   Not on file   Social Determinants of Health   Financial Resource Strain: Low Risk  (12/11/2022)   Received from [facility], [facility]   Overall Financial Resource Strain (CARDIA)    Difficulty of Paying Living Expenses: Not hard at all  Food Insecurity: No Food Insecurity (12/11/2022)   Received from [facility], [facility]   Hunger Vital Sign    Worried About Running Out of Food in the Last Year: Never true    Ran Out of Food  in the Last Year: Never true  Transportation Needs: No Transportation Needs (12/11/2022)   Received from [facility], [facility]   [facility] - Transportation    Lack of Transportation (Medical): No    Lack of Transportation (Non-Medical): No  Physical Activity: Unknown (12/11/2022)   Received from [facility]   Exercise Vital Sign    Days of Exercise per Week: 0 days    Minutes of Exercise per Session: Not on file  Recent Concern: Physical Activity - Inactive (12/11/2022)   Received from [facility], [facility]   Exercise Vital Sign    Days of Exercise per Week: 0 days    Minutes of Exercise per Session: 0 min  Stress: Stress Concern Present (12/11/2022)   Received from [facility], [facility] - Occupational Stress Questionnaire    Feeling of Stress : Rather much  Social Connections: Socially Integrated (12/11/2022)   Received from [facility], [facility]   Social Network    How would you rate your social network (family, work, friends)?: Good participation with social networks  Intimate Partner Violence: Not At Risk (12/11/2022)   Received from [facility], [facility]   HITS    Over the last 12 months how often did your partner physically hurt you?: 1    Over the last 12 months how often did your partner insult you or talk down to you?: 1    Over the last 12 months how often did your partner threaten you with physical harm?: 1    Over the last 12 months how often did your partner scream or curse at you?: 1           Mardella Layman, MD 05/25/23 1013

## 2023-05-25 NOTE — ED Triage Notes (Signed)
Pt states vaginal discharge x 3-4 days.

## 2023-05-26 ENCOUNTER — Telehealth: Payer: Self-pay

## 2023-05-26 MED ORDER — METRONIDAZOLE 500 MG PO TABS: 500.0000 mg | ORAL_TABLET | Freq: Two times a day (BID) | ORAL | 0 refills | Status: DC

## 2023-05-27 LAB — CERVICOVAGINAL ANCILLARY ONLY
Bacterial Vaginitis (gardnerella): POSITIVE — AB
Candida Glabrata: NEGATIVE
Candida Vaginitis: POSITIVE — AB
Chlamydia: NEGATIVE
Comment: NEGATIVE
Comment: NEGATIVE
Comment: NEGATIVE
Comment: NEGATIVE
Comment: NEGATIVE
Comment: NORMAL
Neisseria Gonorrhea: NEGATIVE
Trichomonas: NEGATIVE

## 2023-06-07 ENCOUNTER — Emergency Department (HOSPITAL_BASED_OUTPATIENT_CLINIC_OR_DEPARTMENT_OTHER)
Admission: EM | Admit: 2023-06-07 | Discharge: 2023-06-07 | Disposition: A | Discharge status: Home / Self Care | Payer: 59 | Attending: Emergency Medicine | Admitting: Emergency Medicine

## 2023-06-07 ENCOUNTER — Emergency Department (HOSPITAL_BASED_OUTPATIENT_CLINIC_OR_DEPARTMENT_OTHER): Payer: 59 | Admitting: Radiology

## 2023-06-07 ENCOUNTER — Encounter (HOSPITAL_BASED_OUTPATIENT_CLINIC_OR_DEPARTMENT_OTHER): Payer: Self-pay

## 2023-06-07 DIAGNOSIS — Z20822 Contact with and (suspected) exposure to covid-19: Secondary | ICD-10-CM | POA: Diagnosis not present

## 2023-06-07 DIAGNOSIS — R059 Cough, unspecified: Secondary | ICD-10-CM | POA: Diagnosis present

## 2023-06-07 DIAGNOSIS — J069 Acute upper respiratory infection, unspecified: Secondary | ICD-10-CM | POA: Diagnosis not present

## 2023-06-07 LAB — SARS CORONAVIRUS 2 BY RT PCR: SARS Coronavirus 2 by RT PCR: NEGATIVE

## 2023-06-07 MED ORDER — GUAIFENESIN ER 600 MG PO TB12: 600.0000 mg | ORAL_TABLET | Freq: Two times a day (BID) | ORAL | 0 refills | Status: DC

## 2023-06-07 MED ORDER — ACETAMINOPHEN 325 MG PO TABS
650.0000 mg | ORAL_TABLET | Freq: Once | ORAL | Status: AC
  Administered 2023-06-07: 650 mg via ORAL
  Filled 2023-06-07: qty 2

## 2023-06-07 MED ORDER — ACETAMINOPHEN 500 MG PO TABS: 500.0000 mg | ORAL_TABLET | Freq: Four times a day (QID) | ORAL | 0 refills | Status: DC | PRN

## 2023-06-07 NOTE — ED Provider Notes (Signed)
[place] EMERGENCY DEPARTMENT AT [facility] Provider Note   CSN: [id number] Arrival date & time: 06/07/23  3244     History  Chief Complaint  Patient presents with   Nasal Congestion    Laura [name] is a 22 y.o. female, no pertinent past medical history, who presents to the ED secondary to fatigue, loss of smell, sore throat, productive cough for the last 3 days.  She states she is just very tired, and cannot smell anything, 1 to be checked out.  She denies any shortness of breath or chest pain.  Has not had any known fevers.  Has not taken anything at home for her symptoms. Reports decent PO intake, but reduced appetite.   Home Medications Prior to Admission medications   Medication Sig Start Date End Date Taking? Authorizing Provider  acetaminophen (TYLENOL) 500 MG tablet Take 1 tablet (500 mg total) by mouth every 6 (six) hours as needed. 06/07/23  Yes Jocilynn Grade L, PA  albuterol (PROVENTIL) (2.5 MG/3ML) 0.083% nebulizer solution Take 3 mLs (2.5 mg total) by nebulization every 6 (six) hours as needed for wheezing or shortness of breath. 01/27/22   Roemhildt, Lorin T, PA-C  albuterol (VENTOLIN HFA) 108 (90 Base) MCG/ACT inhaler Inhale 1-2 puffs into the lungs every 6 (six) hours as needed for wheezing or shortness of breath.    [provider]  docusate sodium (COLACE) 100 MG capsule Take 1 capsule (100 mg total) by mouth every 12 (twelve) hours. 02/25/23   Carlisle Beers, FNP  escitalopram (LEXAPRO) 10 MG tablet Take 5 mg by mouth at bedtime. 06/17/22   [provider]  fluconazole (DIFLUCAN) 150 MG tablet Take one tablet by mouth as a single dose. May repeat in 3 days if symptoms persist. 05/25/23   Mardella Layman, MD  guaiFENesin (MUCINEX) 600 MG 12 hr tablet Take 1 tablet (600 mg total) by mouth 2 (two) times daily. 06/07/23  Yes Starling Christofferson L, PA  levonorgestrel (MIRENA, 52 MG,) 20 MCG/DAY IUD to be inserted by provider in office at postpartum visit  ICD 10 Z30.42 10/22/21   [provider]  metroNIDAZOLE (FLAGYL) 500 MG tablet Take 1 tablet (500 mg total) by mouth 2 (two) times daily. 05/26/23   Merrilee Jansky, MD  sertraline (ZOLOFT) 100 MG tablet sertraline 100 mg tablet    [provider]      Allergies    Patient has no known allergies.    Review of Systems   Review of Systems  Constitutional:  Positive for fatigue.  HENT:  Positive for congestion.   Respiratory:  Positive for cough. Negative for shortness of breath.     Physical Exam Updated Vital Signs BP 119/83   Pulse 85   Temp 98.4 F (36.9 C)   Resp 17   Ht 5\' 3"  (1.6 m)   Wt 79.4 kg   SpO2 100%   BMI 31.00 kg/m  Physical Exam Vitals and nursing note reviewed.  Constitutional:      General: She is not in acute distress.    Appearance: She is well-developed.  HENT:     Head: Normocephalic and atraumatic.     Nose: Congestion present.     Mouth/Throat:     Pharynx: Posterior oropharyngeal erythema present. No oropharyngeal exudate.  Eyes:     Conjunctiva/sclera: Conjunctivae normal.  Cardiovascular:     Rate and Rhythm: Normal rate and regular rhythm.     Heart sounds: No murmur heard. Pulmonary:  Effort: Pulmonary effort is normal. No respiratory distress.     Breath sounds: Normal breath sounds.  Abdominal:     Palpations: Abdomen is soft.     Tenderness: There is no abdominal tenderness.  Musculoskeletal:        General: No swelling.     Cervical back: Neck supple.  Skin:    General: Skin is warm and dry.     Capillary Refill: Capillary refill takes less than 2 seconds.  Neurological:     Mental Status: She is alert.  Psychiatric:        Mood and Affect: Mood normal.     ED Results / Procedures / Treatments   Labs (all labs ordered are listed, but only abnormal results are displayed) Labs Reviewed  SARS CORONAVIRUS 2 BY RT PCR    EKG None  Radiology DG Chest 2 View  Result Date: 06/07/2023 CLINICAL DATA:   productive cough, fatigue EXAM: CHEST - 2 VIEW COMPARISON:  None Available. FINDINGS: The heart size and mediastinal contours are within normal limits. Both lungs are clear. The visualized skeletal structures are unremarkable. IMPRESSION: No active cardiopulmonary disease. Electronically Signed   By: Lorenza Cambridge M.D.   On: 06/07/2023 10:31    Procedures Procedures    Medications Ordered in ED Medications  acetaminophen (TYLENOL) tablet 650 mg (650 mg Oral Given 06/07/23 1039)    ED Course/ Medical Decision Making/ A&P                                 Medical Decision Making Patient is overall well-appearing 22 year old female, here for weakness, fatigue, productive cough, loss of spelt smell, this been going on for the last few days.  She denies any sick contacts.  Has not taken anything for the pain.  Is eating and drinking okay, just with a reduced appetite.  Will obtain a COVID test, as well as a chest x-ray to rule out pneumonia.  Since she has good p.o. intake, I will add off on labs at this time she is overall well-appearing  Amount and/or Complexity of Data Reviewed Labs:     Details: COVID-negative Radiology: ordered.    Details: Chest x-ray clear Discussion of management or test interpretation with external provider(s): Discussed with patient, she likely has a viral upper respiratory infection, it still may be COVID even though she tested negative today, it may just be too early on.  I advised her to take Tylenol, and Mucinex, to help with her symptoms, and rest and drink lots of water.  We discussed return precautions and follow-up with PCP.    Risk OTC drugs.   Final Clinical Impression(s) / ED Diagnoses Final diagnoses:  Viral upper respiratory tract infection    Rx / DC Orders ED Discharge Orders          Ordered    guaiFENesin (MUCINEX) 600 MG 12 hr tablet  2 times daily        06/07/23 1035    acetaminophen (TYLENOL) 500 MG tablet  Every 6 hours PRN         06/07/23 1035              Atwell Mcdanel L, PA 06/07/23 1050    Derwood Kaplan, MD 06/07/23 1107

## 2023-06-07 NOTE — Discharge Instructions (Addendum)
I am suspicious that you have a viral upper respiratory infection.  Your chest x-ray was reassuring, and you tested negative for COVID, however you may still have COVID.  I recommend staying away from other individuals, while you are respiratory symptoms, to prevent the spread of the illness.  Make sure you drink lots of fluids, and rest.  If you have severe nausea, vomiting, worsening weakness return to the ER

## 2023-06-07 NOTE — ED Triage Notes (Signed)
States for three days having nasal congestion and productive cough.  Feeling achy and tired.

## 2023-08-03 ENCOUNTER — Emergency Department (HOSPITAL_BASED_OUTPATIENT_CLINIC_OR_DEPARTMENT_OTHER)
Admission: EM | Admit: 2023-08-03 | Discharge: 2023-08-03 | Disposition: A | Discharge status: Home / Self Care | Payer: 59 | Attending: Emergency Medicine | Admitting: Emergency Medicine

## 2023-08-03 ENCOUNTER — Other Ambulatory Visit: Payer: Self-pay

## 2023-08-03 ENCOUNTER — Emergency Department (HOSPITAL_BASED_OUTPATIENT_CLINIC_OR_DEPARTMENT_OTHER): Payer: 59 | Admitting: Radiology

## 2023-08-03 DIAGNOSIS — R079 Chest pain, unspecified: Secondary | ICD-10-CM | POA: Insufficient documentation

## 2023-08-03 DIAGNOSIS — R531 Weakness: Secondary | ICD-10-CM | POA: Diagnosis not present

## 2023-08-03 LAB — BASIC METABOLIC PANEL
Anion gap: 7 (ref 5–15)
BUN: 15 mg/dL (ref 6–20)
CO2: 25 mmol/L (ref 22–32)
Calcium: 9.8 mg/dL (ref 8.9–10.3)
Chloride: 104 mmol/L (ref 98–111)
Creatinine, Ser: 0.83 mg/dL (ref 0.44–1.00)
GFR, Estimated: >60 mL/min (ref >60)
Glucose, Bld: 85 mg/dL (ref 70–99)
Potassium: 3.5 mmol/L (ref 3.5–5.1)
Sodium: 136 mmol/L (ref 135–145)

## 2023-08-03 LAB — CBC
HCT: 38.8 % (ref 36.0–46.0)
Hemoglobin: 12.5 g/dL (ref 12.0–15.0)
MCH: 23.2 pg — ABNORMAL LOW (ref 26.0–34.0)
MCHC: 32.2 g/dL (ref 30.0–36.0)
MCV: 72.1 fL — ABNORMAL LOW (ref 80.0–100.0)
Platelets: 211 10*3/uL (ref 150–400)
RBC: 5.38 MIL/uL — ABNORMAL HIGH (ref 3.87–5.11)
RDW: 13.7 % (ref 11.5–15.5)
WBC: 4.2 10*3/uL (ref 4.0–10.5)
nRBC: 0 % (ref 0.0–0.2)

## 2023-08-03 LAB — TROPONIN I (HIGH SENSITIVITY): Troponin I (High Sensitivity): <2 ng/L (ref <18)

## 2023-08-03 LAB — LIPASE, BLOOD: Lipase: 51 U/L (ref 11–51)

## 2023-08-03 LAB — HEPATIC FUNCTION PANEL
ALT: 9 U/L (ref 0–44)
AST: 15 U/L (ref 15–41)
Albumin: 4.1 g/dL (ref 3.5–5.0)
Alkaline Phosphatase: 36 U/L — ABNORMAL LOW (ref 38–126)
Bilirubin, Direct: 0.1 mg/dL (ref 0.0–0.2)
Indirect Bilirubin: 0.5 mg/dL (ref 0.3–0.9)
Total Bilirubin: 0.6 mg/dL (ref <1.2)
Total Protein: 7.7 g/dL (ref 6.5–8.1)

## 2023-08-03 LAB — PREGNANCY, URINE: Preg Test, Ur: NEGATIVE

## 2023-08-03 NOTE — ED Triage Notes (Signed)
Pt caox4 chest pressure that she woke up to at approx 3-4a. PMH anxiety stating she has gotten CP in the past from anxiety but it was pain then and this time is "a pressure."

## 2023-08-03 NOTE — ED Notes (Signed)
Discharge paperwork given and verbally understood. 

## 2023-08-03 NOTE — ED Provider Notes (Signed)
[place] EMERGENCY DEPARTMENT AT [facility] Provider Note   CSN: [id number] Arrival date & time: 08/03/23  0831     History  Chief Complaint  Patient presents with   Chest Pain    Laura [name] is a 22 y.o. female.  Patient here with chest pain and generalized weakness.  History of anxiety and depression.  Nothing makes it worse or better.  Denies any recent surgery or travel.  Is not on any estrogen birth control.  No cardiac risk factors he is aware of in the family.  No PE history in the family.  She denies any current chest pain shortness of breath nausea vomiting diarrhea.  Just feels kind of generally weak.  Feels little bit more anxious than normal.  3 kids at home.  She is working as well.  The history is provided by the patient.       Home Medications Prior to Admission medications   Medication Sig Start Date End Date Taking? Authorizing Provider  acetaminophen (TYLENOL) 500 MG tablet Take 1 tablet (500 mg total) by mouth every 6 (six) hours as needed. 06/07/23   Small, Brooke L, PA  albuterol (PROVENTIL) (2.5 MG/3ML) 0.083% nebulizer solution Take 3 mLs (2.5 mg total) by nebulization every 6 (six) hours as needed for wheezing or shortness of breath. 01/27/22   Roemhildt, Lorin T, PA-C  albuterol (VENTOLIN HFA) 108 (90 Base) MCG/ACT inhaler Inhale 1-2 puffs into the lungs every 6 (six) hours as needed for wheezing or shortness of breath.    [provider]  docusate sodium (COLACE) 100 MG capsule Take 1 capsule (100 mg total) by mouth every 12 (twelve) hours. 02/25/23   Carlisle Beers, FNP  escitalopram (LEXAPRO) 10 MG tablet Take 5 mg by mouth at bedtime. 06/17/22   [provider]  fluconazole (DIFLUCAN) 150 MG tablet Take one tablet by mouth as a single dose. May repeat in 3 days if symptoms persist. 05/25/23   Mardella Layman, MD  guaiFENesin (MUCINEX) 600 MG 12 hr tablet Take 1 tablet (600 mg total) by mouth 2 (two) times daily. 06/07/23    Small, Brooke L, PA  levonorgestrel (MIRENA, 52 MG,) 20 MCG/DAY IUD to be inserted by provider in office at postpartum visit ICD 10 Z30.42 10/22/21   [provider]  metroNIDAZOLE (FLAGYL) 500 MG tablet Take 1 tablet (500 mg total) by mouth 2 (two) times daily. 05/26/23   Merrilee Jansky, MD  sertraline (ZOLOFT) 100 MG tablet sertraline 100 mg tablet    [provider]      Allergies    Patient has no known allergies.    Review of Systems   Review of Systems  Physical Exam Updated Vital Signs BP 111/71 (BP Location: Right Arm)   Pulse 73   Temp 98.1 F (36.7 C) (Oral)   Resp 16   Ht 5\' 3"  (1.6 m)   Wt 59 kg   LMP  (LMP Unknown)   SpO2 100%   BMI 23.03 kg/m  Physical Exam Vitals and nursing note reviewed.  Constitutional:      General: She is not in acute distress.    Appearance: She is well-developed. She is not ill-appearing.  HENT:     Head: Normocephalic and atraumatic.  Eyes:     Extraocular Movements: Extraocular movements intact.     Conjunctiva/sclera: Conjunctivae normal.     Pupils: Pupils are equal, round, and reactive to light.  Cardiovascular:     Rate and Rhythm:  Normal rate and regular rhythm.     Heart sounds: Normal heart sounds. No murmur heard. Pulmonary:     Effort: Pulmonary effort is normal. No respiratory distress.     Breath sounds: Normal breath sounds. No decreased breath sounds.  Abdominal:     Palpations: Abdomen is soft.     Tenderness: There is no abdominal tenderness.  Musculoskeletal:        General: No swelling.     Cervical back: Normal range of motion and neck supple.  Skin:    General: Skin is warm and dry.     Capillary Refill: Capillary refill takes less than 2 seconds.  Neurological:     Mental Status: She is alert.  Psychiatric:        Mood and Affect: Mood normal.     ED Results / Procedures / Treatments   Labs (all labs ordered are listed, but only abnormal results are displayed) Labs Reviewed   CBC - Abnormal; Notable for the following components:      Result Value   RBC 5.38 (*)    MCV 72.1 (*)    MCH 23.2 (*)    All other components within normal limits  HEPATIC FUNCTION PANEL - Abnormal; Notable for the following components:   Alkaline Phosphatase 36 (*)    All other components within normal limits  BASIC METABOLIC PANEL  PREGNANCY, URINE  LIPASE, BLOOD  TROPONIN I (HIGH SENSITIVITY)    EKG EKG Interpretation Date/Time:  Monday August 03 2023 08:40:15 EST Ventricular Rate:  81 PR Interval:  136 QRS Duration:  82 QT Interval:  368 QTC Calculation: 427 R Axis:   85  Text Interpretation: Normal sinus rhythm Right atrial enlargement Borderline ECG When compared with ECG of 06-Nov-2021 11:35, Nonspecific T wave abnormality, improved in Anterior leads Confirmed by Virgina Norfolk [phone]) on 08/03/2023 9:09:14 AM  Radiology DG Chest 2 View  Result Date: 08/03/2023 CLINICAL DATA:  Chest pain.  Shortness of breath. EXAM: CHEST - 2 VIEW COMPARISON:  06/07/2023. FINDINGS: Bilateral lung fields are clear. Bilateral costophrenic angles are clear. Normal cardio-mediastinal silhouette. No acute osseous abnormalities. The soft tissues are within normal limits. IMPRESSION: *No active cardiopulmonary disease. Electronically Signed   By: Jules Schick M.D.   On: 08/03/2023 09:29    Procedures Procedures    Medications Ordered in ED Medications - No data to display  ED Course/ Medical Decision Making/ A&P                                 Medical Decision Making Amount and/or Complexity of Data Reviewed Labs: ordered. Radiology: ordered.   Denina [name] is here with chest pain and weakness.  Normal vitals.  No fever.  Differential diagnosis likely stress related process but will evaluate for ACS, electrolyte abnormality, anemia, infectious process.  Patient is PERC negative and doubt PE.  No risk factors.  Heart score centrally 0 but will get troponin, basic labs chest  x-ray.  Per my review and interpretation of EKG, sinus rhythm.  No ischemic changes.  Per my review and interpretation the labs no significant anemia or electrolyte abnormality kidney injury or leukocytosis.  Chest x-ray per my review interpretation shows no evidence of pneumonia or pneumothorax.  Troponin is normal.  I have no concern for cardiac process.  No concern for PE.  I do suspect some stress and poor sleep.  Recommend follow-up with primary care doctor.  Discharged in good condition.  Understands return precautions.  This chart was dictated using voice recognition software.  Despite best efforts to proofread,  errors can occur which can change the documentation meaning.         Final Clinical Impression(s) / ED Diagnoses Final diagnoses:  Weakness  Chest pain, unspecified type    Rx / DC Orders ED Discharge Orders     None         Virgina Norfolk, DO 08/03/23 1027

## 2023-08-16 ENCOUNTER — Encounter (HOSPITAL_COMMUNITY): Payer: Self-pay | Admitting: Emergency Medicine

## 2023-08-16 ENCOUNTER — Other Ambulatory Visit: Payer: Self-pay

## 2023-08-16 ENCOUNTER — Ambulatory Visit (HOSPITAL_COMMUNITY)
Admission: EM | Admit: 2023-08-16 | Discharge: 2023-08-16 | Disposition: A | Discharge status: Home / Self Care | Payer: 59 | Attending: Emergency Medicine | Admitting: Emergency Medicine

## 2023-08-16 DIAGNOSIS — K529 Noninfective gastroenteritis and colitis, unspecified: Secondary | ICD-10-CM | POA: Diagnosis not present

## 2023-08-16 LAB — POCT URINALYSIS DIP (MANUAL ENTRY)
Bilirubin, UA: NEGATIVE
Blood, UA: NEGATIVE
Glucose, UA: NEGATIVE mg/dL
Ketones, POC UA: NEGATIVE mg/dL
Leukocytes, UA: NEGATIVE
Nitrite, UA: NEGATIVE
Protein Ur, POC: NEGATIVE mg/dL
Spec Grav, UA: 1.025
Urobilinogen, UA: 0.2 U/dL
pH, UA: 6.5

## 2023-08-16 LAB — POCT URINE PREGNANCY: Preg Test, Ur: NEGATIVE

## 2023-08-16 MED ORDER — ONDANSETRON 4 MG PO TBDP
4.0000 mg | ORAL_TABLET | Freq: Once | ORAL | Status: AC
  Administered 2023-08-16: 4 mg via ORAL

## 2023-08-16 MED ORDER — ONDANSETRON 4 MG PO TBDP
ORAL_TABLET | ORAL | Status: AC
Start: 2023-08-16 — End: ?
  Filled 2023-08-16: qty 1

## 2023-08-16 MED ORDER — ONDANSETRON HCL 4 MG PO TABS: 4.0000 mg | ORAL_TABLET | Freq: Four times a day (QID) | ORAL | 0 refills | Status: DC

## 2023-08-16 NOTE — ED Notes (Signed)
Warm blankets provided to patient.

## 2023-08-16 NOTE — ED Triage Notes (Signed)
Complains of abdominal pain and vomiting.  Symptoms started last night.  reports abdominal pain is across lower abdomen.  Reports 4 episodes of vomiting today.  Reports last episode of vomiting was "black"  denies drinking dark liquids prior to vomiting.  No history of this prior to now.  Last BM was this morning and normal per patient.

## 2023-08-16 NOTE — Discharge Instructions (Addendum)
The zofran can be used every 6 hours as needed to settle the stomach. I have sent you the tablets that are swallowed   Drink LOTS of fluids!  Try bland diet if tolerated

## 2023-08-16 NOTE — ED Provider Notes (Signed)
[facility]    CSN: [id number] Arrival date & time: 08/16/23  1248     History   Chief Complaint Chief Complaint  Patient presents with   Abdominal Pain    HPI Laura [name] is a 22 y.o. female.  Here with lower abdominal pain and cramping, rated 5/10 discomfort About 4 episodes of emesis today Not having diarrhea.  Last BM this morning, normal No fevers No known sick contacts She consumed alcohol last night  LMP unknown, gets injection.  She thinks last was 2 months ago  Past Medical History:  Diagnosis Date   Anemia    Anxiety    Asthma    Depression    Genital herpes    Ovarian cyst     There are no problems to display for this patient.   Past Surgical History:  Procedure Laterality Date   NO PAST SURGERIES     OVARIAN CYST REMOVAL      OB History     Gravida  3   Para  2   Term  2   Preterm      AB      Living  2      SAB      IAB      Ectopic      Multiple      Live Births  2            Home Medications    Prior to Admission medications   Medication Sig Start Date End Date Taking? Authorizing Provider  busPIRone (BUSPAR) 15 MG tablet Take by mouth. 07/13/23  Yes [provider]  ondansetron (ZOFRAN) 4 MG tablet Take 1 tablet (4 mg total) by mouth every 6 (six) hours. 08/16/23  Yes Jorah Hua, Lurena Joiner, PA-C  acetaminophen (TYLENOL) 500 MG tablet Take 1 tablet (500 mg total) by mouth every 6 (six) hours as needed. 06/07/23   Small, Brooke L, PA  albuterol (PROVENTIL) (2.5 MG/3ML) 0.083% nebulizer solution Take 3 mLs (2.5 mg total) by nebulization every 6 (six) hours as needed for wheezing or shortness of breath. 01/27/22   Roemhildt, Lorin T, PA-C  albuterol (VENTOLIN HFA) 108 (90 Base) MCG/ACT inhaler Inhale 1-2 puffs into the lungs every 6 (six) hours as needed for wheezing or shortness of breath.    [provider]  escitalopram (LEXAPRO) 10 MG tablet Take 5 mg by mouth at bedtime. 06/17/22   [provider]  levonorgestrel (MIRENA, 52 MG,) 20 MCG/DAY IUD to be inserted by provider in office at postpartum visit ICD 10 Z30.42 10/22/21   [provider]    Family History Family History  Problem Relation Age of Onset   Epilepsy Father     Social History Social History   Tobacco Use   Smoking status: Former    Current packs/day: 0.00    Types: Cigarettes, Cigars    Quit date: 10/07/2018    Years since quitting: 4.8   Smokeless tobacco: Never  Vaping Use   Vaping status: Some Days  Substance Use Topics   Alcohol use: Yes   Drug use: No     Allergies   Patient has no known allergies.   Review of Systems Review of Systems  Gastrointestinal:  Positive for abdominal pain.   As per HPI  Physical Exam Triage Vital Signs ED Triage Vitals  Encounter Vitals Group     BP 08/16/23 1437 113/71     Systolic BP Percentile --  Diastolic BP Percentile --      Pulse Rate 08/16/23 1437 67     Resp 08/16/23 1437 16     Temp 08/16/23 1437 98.6 F (37 C)     Temp Source 08/16/23 1437 Oral     SpO2 08/16/23 1437 98 %     Weight --      Height --      Head Circumference --      Peak Flow --      Pain Score 08/16/23 1433 5     Pain Loc --      Pain Education --      Exclude from Growth Chart --    No data found.  Updated Vital Signs BP 113/71 (BP Location: Right Arm)   Pulse 67   Temp 98.6 F (37 C) (Oral)   Resp 16   LMP  (LMP Unknown) Comment: last menstrual cycle was 2 months ago=08/16/2023  SpO2 98%    Physical Exam Vitals and nursing note reviewed.  Constitutional:      Appearance: Normal appearance.  HENT:     Mouth/Throat:     Mouth: Mucous membranes are moist.     Pharynx: Oropharynx is clear.  Eyes:     Conjunctiva/sclera: Conjunctivae normal.  Cardiovascular:     Rate and Rhythm: Normal rate and regular rhythm.     Heart sounds: Normal heart sounds.  Pulmonary:     Effort: Pulmonary effort is normal.     Breath sounds: Normal  breath sounds.  Abdominal:     General: Bowel sounds are normal.     Palpations: Abdomen is soft.     Tenderness: There is generalized abdominal tenderness. There is no right CVA tenderness, left CVA tenderness, guarding or rebound.  Musculoskeletal:        General: Normal range of motion.  Skin:    General: Skin is warm and dry.  Neurological:     Mental Status: She is alert and oriented to person, place, and time.     UC Treatments / Results  Labs (all labs ordered are listed, but only abnormal results are displayed) Labs Reviewed  POCT URINALYSIS DIP (MANUAL ENTRY) - Abnormal; Notable for the following components:      Result Value   Clarity, UA cloudy (*)    All other components within normal limits  POCT URINE PREGNANCY    EKG  Radiology No results found.  Procedures Procedures   Medications Ordered in UC Medications  ondansetron (ZOFRAN-ODT) disintegrating tablet 4 mg (4 mg Oral Given 08/16/23 1510)    Initial Impression / Assessment and Plan / UC Course  I have reviewed the triage vital signs and the nursing notes.  Pertinent labs & imaging results that were available during my care of the patient were reviewed by me and considered in my medical decision making (see chart for details).  UPT negative UA unremarkable Zofran ODT given in clinic. PO challenge successful. She is feeling and looking better.  Stomach bug vs alcohol induced Discussed importance of fluids and rehydrating, can use zofran q6 hours prn, bland diet as tolerated. Work note provided Return precautions discussed. Patient agrees to plan  Final Clinical Impressions(s) / UC Diagnoses   Final diagnoses:  Gastroenteritis     Discharge Instructions      The zofran can be used every 6 hours as needed to settle the stomach. I have sent you the tablets that are swallowed   Drink LOTS of fluids!  Try bland  diet if tolerated     ED Prescriptions     Medication Sig Dispense Auth.  Provider   ondansetron (ZOFRAN) 4 MG tablet Take 1 tablet (4 mg total) by mouth every 6 (six) hours. 12 tablet Tarquin Welcher, Lurena Joiner, PA-C      PDMP not reviewed this encounter.   Marlow Baars, [place] 08/16/23 1550

## 2023-08-27 ENCOUNTER — Emergency Department (HOSPITAL_BASED_OUTPATIENT_CLINIC_OR_DEPARTMENT_OTHER)
Admission: EM | Admit: 2023-08-27 | Discharge: 2023-08-27 | Disposition: A | Discharge status: Home / Self Care | Payer: 59 | Attending: Emergency Medicine | Admitting: Emergency Medicine

## 2023-08-27 ENCOUNTER — Other Ambulatory Visit: Payer: Self-pay

## 2023-08-27 ENCOUNTER — Emergency Department (HOSPITAL_BASED_OUTPATIENT_CLINIC_OR_DEPARTMENT_OTHER): Payer: 59 | Admitting: Radiology

## 2023-08-27 ENCOUNTER — Encounter (HOSPITAL_BASED_OUTPATIENT_CLINIC_OR_DEPARTMENT_OTHER): Payer: Self-pay

## 2023-08-27 DIAGNOSIS — Z1152 Encounter for screening for COVID-19: Secondary | ICD-10-CM | POA: Diagnosis not present

## 2023-08-27 DIAGNOSIS — R519 Headache, unspecified: Secondary | ICD-10-CM | POA: Diagnosis not present

## 2023-08-27 DIAGNOSIS — J45909 Unspecified asthma, uncomplicated: Secondary | ICD-10-CM | POA: Insufficient documentation

## 2023-08-27 DIAGNOSIS — R059 Cough, unspecified: Secondary | ICD-10-CM | POA: Diagnosis present

## 2023-08-27 DIAGNOSIS — J101 Influenza due to other identified influenza virus with other respiratory manifestations: Secondary | ICD-10-CM | POA: Diagnosis not present

## 2023-08-27 LAB — BASIC METABOLIC PANEL
Anion gap: 6 (ref 5–15)
BUN: 13 mg/dL (ref 6–20)
CO2: 24 mmol/L (ref 22–32)
Calcium: 8.5 mg/dL — ABNORMAL LOW (ref 8.9–10.3)
Chloride: 106 mmol/L (ref 98–111)
Creatinine, Ser: 0.74 mg/dL (ref 0.44–1.00)
GFR, Estimated: >60 mL/min (ref >60)
Glucose, Bld: 85 mg/dL (ref 70–99)
Potassium: 3.6 mmol/L (ref 3.5–5.1)
Sodium: 136 mmol/L (ref 135–145)

## 2023-08-27 LAB — RESP PANEL BY RT-PCR (RSV, FLU A&B, COVID)  RVPGX2
Influenza A by PCR: POSITIVE — AB
Influenza B by PCR: NEGATIVE
Resp Syncytial Virus by PCR: NEGATIVE
SARS Coronavirus 2 by RT PCR: NEGATIVE

## 2023-08-27 LAB — CBC WITH DIFFERENTIAL/PLATELET
Abs Immature Granulocytes: 0 10*3/uL (ref 0.00–0.07)
Basophils Absolute: 0 10*3/uL (ref 0.0–0.1)
Basophils Relative: 1 %
Eosinophils Absolute: 0 10*3/uL (ref 0.0–0.5)
Eosinophils Relative: 1 %
HCT: 34.9 % — ABNORMAL LOW (ref 36.0–46.0)
Hemoglobin: 11 g/dL — ABNORMAL LOW (ref 12.0–15.0)
Immature Granulocytes: 0 %
Lymphocytes Relative: 19 %
Lymphs Abs: 0.6 10*3/uL — ABNORMAL LOW (ref 0.7–4.0)
MCH: 22.9 pg — ABNORMAL LOW (ref 26.0–34.0)
MCHC: 31.5 g/dL (ref 30.0–36.0)
MCV: 72.7 fL — ABNORMAL LOW (ref 80.0–100.0)
Monocytes Absolute: 0.4 10*3/uL (ref 0.1–1.0)
Monocytes Relative: 13 %
Neutro Abs: 2.2 10*3/uL (ref 1.7–7.7)
Neutrophils Relative %: 66 %
Platelets: 155 10*3/uL (ref 150–400)
RBC: 4.8 MIL/uL (ref 3.87–5.11)
RDW: 14.1 % (ref 11.5–15.5)
WBC: 3.3 10*3/uL — ABNORMAL LOW (ref 4.0–10.5)
nRBC: 0 % (ref 0.0–0.2)

## 2023-08-27 LAB — PREGNANCY, URINE: Preg Test, Ur: NEGATIVE

## 2023-08-27 MED ORDER — KETOROLAC TROMETHAMINE 15 MG/ML IJ SOLN
15.0000 mg | Freq: Once | INTRAMUSCULAR | Status: AC
  Administered 2023-08-27: 15 mg via INTRAVENOUS
  Filled 2023-08-27: qty 1

## 2023-08-27 MED ORDER — METOCLOPRAMIDE HCL 5 MG/ML IJ SOLN
10.0000 mg | Freq: Once | INTRAMUSCULAR | Status: AC
  Administered 2023-08-27: 10 mg via INTRAVENOUS
  Filled 2023-08-27: qty 2

## 2023-08-27 MED ORDER — METOCLOPRAMIDE HCL 10 MG PO TABS: 10.0000 mg | ORAL_TABLET | Freq: Four times a day (QID) | ORAL | 0 refills | Status: DC

## 2023-08-27 MED ORDER — DIPHENHYDRAMINE HCL 50 MG/ML IJ SOLN
25.0000 mg | Freq: Once | INTRAMUSCULAR | Status: AC
  Administered 2023-08-27: 25 mg via INTRAVENOUS
  Filled 2023-08-27: qty 1

## 2023-08-27 NOTE — ED Triage Notes (Signed)
Pt POV from home c/o cough and headache w/ light sensitivity x 3 days. C/o generalized body aches and chest pain with coughing. Pt states she's had "the chills and the sweats". Took home COVID/flu test and was negative this morning.

## 2023-08-27 NOTE — ED Notes (Signed)
Patient transported to X-ray 

## 2023-08-27 NOTE — ED Provider Notes (Signed)
[place] EMERGENCY DEPARTMENT AT [facility] Provider Note   CSN: [id number] Arrival date & time: 08/27/23  0740     History  Chief Complaint  Patient presents with   Cough   Headache    Laura [name] is a 22 y.o. female.  22 year old female with history of asthma who presents to the emergency department with cough, headache, and chills.  Patient reports that she has several sick children at home.  Approximately 3 days ago started feeling sick with a cough.  Since then has been having chills but no fevers.  Has diffuse muscle aches at this time.  Yesterday started developing a headache that is holocephalic but mostly behind her eyes.  Has some mild photophobia.  Feels like pressure.  No significant neck stiffness.  Currently 8/10 in severity.  Gradual in onset.  Had 2 loose stools this morning as well but no vomiting.  Did take a home COVID and flu test that was negative this morning.       Home Medications Prior to Admission medications   Medication Sig Start Date End Date Taking? Authorizing Provider  metoCLOPramide (REGLAN) 10 MG tablet Take 1 tablet (10 mg total) by mouth every 6 (six) hours. 08/27/23  Yes Laura Baton, MD  acetaminophen (TYLENOL) 500 MG tablet Take 1 tablet (500 mg total) by mouth every 6 (six) hours as needed. 06/07/23   [name], Laura L, PA  albuterol (PROVENTIL) (2.5 MG/3ML) 0.083% nebulizer solution Take 3 mLs (2.5 mg total) by nebulization every 6 (six) hours as needed for wheezing or shortness of breath. 01/27/22   [name], Laura T, PA-C  albuterol (VENTOLIN HFA) 108 (90 Base) MCG/ACT inhaler Inhale 1-2 puffs into the lungs every 6 (six) hours as needed for wheezing or shortness of breath.    [provider]  busPIRone (BUSPAR) 15 MG tablet Take by mouth. 07/13/23   [provider]  escitalopram (LEXAPRO) 10 MG tablet Take 5 mg by mouth at bedtime. 06/17/22   [provider]  levonorgestrel (MIRENA, 52 MG,) 20  MCG/DAY IUD to be inserted by provider in office at postpartum visit ICD 10 Z30.42 10/22/21   [provider]  ondansetron (ZOFRAN) 4 MG tablet Take 1 tablet (4 mg total) by mouth every 6 (six) hours. 08/16/23   Rising, Laura Joiner, PA-C      Allergies    Patient has no known allergies.    Review of Systems   Review of Systems  Physical Exam Updated Vital Signs BP 127/85   Pulse (!) 104   Temp 99.7 F (37.6 C)   Resp 17   Ht 5\' 3"  (1.6 m)   Wt 59 kg   LMP  (LMP Unknown)   SpO2 98%   BMI 23.03 kg/m  Physical Exam Vitals and nursing note reviewed.  Constitutional:      General: She is not in acute distress.    Appearance: She is well-developed.  HENT:     Head: Normocephalic and atraumatic.     Right Ear: External ear normal.     Left Ear: External ear normal.     Nose: Nose normal.     Mouth/Throat:     Pharynx: Posterior oropharyngeal erythema present. No oropharyngeal exudate.  Eyes:     Extraocular Movements: Extraocular movements intact.     Conjunctiva/sclera: Conjunctivae normal.     Pupils: Pupils are equal, round, and reactive to light.     Comments: Pupils 5 mm bilaterally  Neck:  Comments: Negative Kernig and Brudzinski sign Cardiovascular:     Rate and Rhythm: Normal rate and regular rhythm.     Heart sounds: No murmur heard. Pulmonary:     Effort: Pulmonary effort is normal. No respiratory distress.     Breath sounds: Normal breath sounds.  Musculoskeletal:     Cervical back: Normal range of motion and neck supple. No rigidity.     Right lower leg: No edema.     Left lower leg: No edema.  Lymphadenopathy:     Cervical: No cervical adenopathy.  Skin:    General: Skin is warm and dry.  Neurological:     Mental Status: She is alert and oriented to person, place, and time. Mental status is at baseline.     Cranial Nerves: No cranial nerve deficit.     Sensory: No sensory deficit.     Motor: No weakness.  Psychiatric:        Mood and Affect:  Mood normal.     ED Results / Procedures / Treatments   Labs (all labs ordered are listed, but only abnormal results are displayed) Labs Reviewed  RESP PANEL BY RT-PCR (RSV, FLU A&B, COVID)  RVPGX2 - Abnormal; Notable for the following components:      Result Value   Influenza A by PCR POSITIVE (*)    All other components within normal limits  BASIC METABOLIC PANEL - Abnormal; Notable for the following components:   Calcium 8.5 (*)    All other components within normal limits  CBC WITH DIFFERENTIAL/PLATELET - Abnormal; Notable for the following components:   WBC 3.3 (*)    Hemoglobin 11.0 (*)    HCT 34.9 (*)    MCV 72.7 (*)    MCH 22.9 (*)    Lymphs Abs 0.6 (*)    All other components within normal limits  PREGNANCY, URINE    EKG None  Radiology DG Chest 2 View  Result Date: 08/27/2023 CLINICAL DATA:  22 year old female with history of cough, chills, headache for the past 3 days. EXAM: CHEST - 2 VIEW COMPARISON:  Chest x-ray 08/03/2023. FINDINGS: Lung volumes are normal. No consolidative airspace disease. No pleural effusions. No pneumothorax. No pulmonary nodule or mass noted. Pulmonary vasculature and the cardiomediastinal silhouette are within normal limits. IMPRESSION: No radiographic evidence of acute cardiopulmonary disease. Electronically Signed   By: Trudie Reed M.D.   On: 08/27/2023 09:01    Procedures Procedures    Medications Ordered in ED Medications  metoCLOPramide (REGLAN) injection 10 mg (10 mg Intravenous Given 08/27/23 0836)  diphenhydrAMINE (BENADRYL) injection 25 mg (25 mg Intravenous Given 08/27/23 0836)  ketorolac (TORADOL) 15 MG/ML injection 15 mg (15 mg Intravenous Given 08/27/23 0836)    ED Course/ Medical Decision Making/ A&P Clinical Course as of 08/27/23 0933  Thu Aug 27, 2023  0924 Influenza A By PCR(!): POSITIVE [RP]    Clinical Course User Index [RP] Laura Baton, MD                                 Medical Decision  Making Amount and/or Complexity of Data Reviewed Labs: ordered. Decision-making details documented in ED Course. Radiology: ordered.  Risk Prescription drug management.   Laura [name] is a 22 y.o. female with comorbidities that complicate the patient evaluation including asthma who presents to the emergency department with URI symptoms and headache  Initial Ddx:  URI, pneumonia, migraine, meningitis/encephalitis, cavernous  sinus thrombosis, sinusitis  MDM/Course:  Patient presents to the emergency department with URI symptoms and headache.  On exam is overall well-appearing.  No meningismus at all and no neurologic deficits.  Lungs are clear to auscultation bilaterally and she is satting well on room air.  Chest x-ray without evidence of pneumonia.  Did have blood work that was unremarkable with outside from some mild leukopenia.  COVID and flu showed that she did have influenza A.  Given her age and risk factors feel that there would be little benefit to Tamiflu and may experience some negative side effects from medication so we will hold off at this time.  Was given a migraine cocktail and upon re-evaluation her headache had significantly improved.  Discharged home with prescription for Reglan for her headache any nausea.  Instructed to follow-up with her primary doctor next week.  This patient presents to the ED for concern of complaints listed in HPI, this involves an extensive number of treatment options, and is a complaint that carries with it a high risk of complications and morbidity. Disposition including potential need for admission considered.   Dispo: DC Home. Return precautions discussed including, but not limited to, those listed in the AVS. Allowed pt time to ask questions which were answered fully prior to dc.  Additional history obtained from significant other Records reviewed Outpatient Clinic Notes The following labs were independently interpreted: Chemistry and show no  acute abnormality I independently reviewed the following imaging with scope of interpretation limited to determining acute life threatening conditions related to emergency care: Chest x-ray and agree with the radiologist interpretation with the following exceptions: none I personally reviewed and interpreted cardiac monitoring: normal sinus rhythm  I have reviewed the patients home medications and made adjustments as needed  Portions of this note were generated with Dragon dictation software. Dictation errors may occur despite best attempts at proofreading.           Final Clinical Impression(s) / ED Diagnoses Final diagnoses:  Influenza A  Acute nonintractable headache, unspecified headache type    Rx / DC Orders ED Discharge Orders          Ordered    metoCLOPramide (REGLAN) 10 MG tablet  Every 6 hours        08/27/23 0931              Laura Baton, MD 08/27/23 [phone]

## 2023-08-27 NOTE — ED Notes (Signed)
Discharge instructions, follow up care, pain management and prescription reviewed and explained, pt verbalized understanding and had no further questions on d/c. Pt caox4, ambulatory, NAD on d/c.

## 2023-08-27 NOTE — Discharge Instructions (Addendum)
You were seen for your influenza infection in the emergency department.   At home, please use Tylenol and ibuprofen for your muscle aches and fevers.  Please use over-the-counter cough medication or tea with honey for your cough.  You may take the Reglan for any severe headaches that you have.  Follow-up with your primary doctor in 2-3 days regarding your visit.  This may be over the phone if you are feeling better or in person if you are feeling worse.   Return immediately to the emergency department if you experience any of the following: Difficulty breathing, or any other concerning symptoms.    Thank you for visiting our Emergency Department. It was a pleasure taking care of you today.

## 2023-08-28 ENCOUNTER — Emergency Department (HOSPITAL_BASED_OUTPATIENT_CLINIC_OR_DEPARTMENT_OTHER)
Admission: EM | Admit: 2023-08-28 | Discharge: 2023-08-28 | Disposition: A | Discharge status: Home / Self Care | Payer: 59 | Attending: Emergency Medicine | Admitting: Emergency Medicine

## 2023-08-28 ENCOUNTER — Encounter (HOSPITAL_BASED_OUTPATIENT_CLINIC_OR_DEPARTMENT_OTHER): Payer: Self-pay

## 2023-08-28 DIAGNOSIS — R509 Fever, unspecified: Secondary | ICD-10-CM | POA: Diagnosis present

## 2023-08-28 DIAGNOSIS — J111 Influenza due to unidentified influenza virus with other respiratory manifestations: Secondary | ICD-10-CM | POA: Insufficient documentation

## 2023-08-28 LAB — GROUP A STREP BY PCR: Group A Strep by PCR: NOT DETECTED

## 2023-08-28 MED ORDER — KETOROLAC TROMETHAMINE 15 MG/ML IJ SOLN
15.0000 mg | Freq: Once | INTRAMUSCULAR | Status: AC
  Administered 2023-08-28: 15 mg via INTRAMUSCULAR

## 2023-08-28 MED ORDER — KETOROLAC TROMETHAMINE 15 MG/ML IJ SOLN
15.0000 mg | Freq: Once | INTRAMUSCULAR | Status: DC
Start: 2023-08-28 — End: 2023-08-28
  Filled 2023-08-28: qty 1

## 2023-08-28 MED ORDER — ACETAMINOPHEN 325 MG PO TABS
650.0000 mg | ORAL_TABLET | Freq: Once | ORAL | Status: DC | PRN
Start: 2023-08-28 — End: 2023-08-29
  Filled 2023-08-28: qty 2

## 2023-08-28 NOTE — Discharge Instructions (Signed)
Today were seen for influenza.  You may alternate taking Tylenol and Motrin as needed for pain and fever.  Please do not take Motrin for greater than 5 days in a row as this may cause rebound headaches.  You may also do salt water rinses for sore throat and take honey for sore throat and cough.  Thank you for letting us treat you today. After performing physical exam and reviewing your labs, I feel you are safe to go home. Please follow up with your PCP in the next several days and provide them with your records from this visit. Return to the Emergency Room if pain becomes severe or symptoms worsen.

## 2023-08-28 NOTE — ED Provider Notes (Signed)
[place] EMERGENCY DEPARTMENT AT [facility] Provider Note   CSN: [id number] Arrival date & time: 08/28/23  1737     History  Chief Complaint  Patient presents with   known flu with sore throat    Laura [name] is a 22 y.o. female diagnosed with flu yesterday presents today for sore throat, fever, chills, and nausea.  Patient states she has been taking NyQuil and ibuprofen at home.  Patient denies vomiting, abdominal pain, diarrhea, shortness of breath, or chest pain.  Patient endorses headache and productive cough.  HPI     Home Medications Prior to Admission medications   Medication Sig Start Date End Date Taking? Authorizing Provider  acetaminophen (TYLENOL) 500 MG tablet Take 1 tablet (500 mg total) by mouth every 6 (six) hours as needed. 06/07/23   Small, Brooke L, PA  albuterol (PROVENTIL) (2.5 MG/3ML) 0.083% nebulizer solution Take 3 mLs (2.5 mg total) by nebulization every 6 (six) hours as needed for wheezing or shortness of breath. 01/27/22   Roemhildt, Lorin T, PA-C  albuterol (VENTOLIN HFA) 108 (90 Base) MCG/ACT inhaler Inhale 1-2 puffs into the lungs every 6 (six) hours as needed for wheezing or shortness of breath.    [provider]  busPIRone (BUSPAR) 15 MG tablet Take by mouth. 07/13/23   [provider]  escitalopram (LEXAPRO) 10 MG tablet Take 5 mg by mouth at bedtime. 06/17/22   [provider]  levonorgestrel (MIRENA, 52 MG,) 20 MCG/DAY IUD to be inserted by provider in office at postpartum visit ICD 10 Z30.42 10/22/21   [provider]  metoCLOPramide (REGLAN) 10 MG tablet Take 1 tablet (10 mg total) by mouth every 6 (six) hours. 08/27/23   Rondel Baton, MD  ondansetron (ZOFRAN) 4 MG tablet Take 1 tablet (4 mg total) by mouth every 6 (six) hours. 08/16/23   Rising, Lurena Joiner, PA-C      Allergies    Patient has no known allergies.    Review of Systems   Review of Systems  Constitutional:  Positive for chills  and fever.  HENT:  Positive for sore throat.   Respiratory:  Positive for cough.   Gastrointestinal:  Positive for nausea.    Physical Exam Updated Vital Signs BP 110/79 (BP Location: Right Arm)   Pulse 91   Temp 99.3 F (37.4 C) (Oral)   Resp 18   LMP  (LMP Unknown)   SpO2 100%  Physical Exam Vitals and nursing note reviewed.  Constitutional:      General: She is not in acute distress.    Appearance: She is well-developed.  HENT:     Head: Normocephalic and atraumatic.     Right Ear: External ear normal.     Left Ear: External ear normal.     Nose: Congestion present.     Mouth/Throat:     Mouth: Mucous membranes are moist.     Pharynx: Posterior oropharyngeal erythema present.  Eyes:     Extraocular Movements: Extraocular movements intact.     Conjunctiva/sclera: Conjunctivae normal.  Cardiovascular:     Rate and Rhythm: Normal rate and regular rhythm.     Heart sounds: No murmur heard. Pulmonary:     Effort: Pulmonary effort is normal. No respiratory distress.     Breath sounds: Normal breath sounds.  Abdominal:     Palpations: Abdomen is soft.     Tenderness: There is no abdominal tenderness.  Musculoskeletal:        General: No swelling.  Cervical back: Normal range of motion and neck supple. No rigidity.     Right lower leg: No edema.     Left lower leg: No edema.  Skin:    General: Skin is warm and dry.     Capillary Refill: Capillary refill takes less than 2 seconds.  Neurological:     General: No focal deficit present.     Mental Status: She is alert.  Psychiatric:        Mood and Affect: Mood normal.     ED Results / Procedures / Treatments   Labs (all labs ordered are listed, but only abnormal results are displayed) Labs Reviewed  GROUP A STREP BY PCR    EKG None  Radiology DG Chest 2 View  Result Date: 08/27/2023 CLINICAL DATA:  22 year old female with history of cough, chills, headache for the past 3 days. EXAM: CHEST - 2 VIEW  COMPARISON:  Chest x-ray 08/03/2023. FINDINGS: Lung volumes are normal. No consolidative airspace disease. No pleural effusions. No pneumothorax. No pulmonary nodule or mass noted. Pulmonary vasculature and the cardiomediastinal silhouette are within normal limits. IMPRESSION: No radiographic evidence of acute cardiopulmonary disease. Electronically Signed   By: Trudie Reed M.D.   On: 08/27/2023 09:01    Procedures Procedures    Medications Ordered in ED Medications  acetaminophen (TYLENOL) tablet 650 mg (has no administration in time range)  ketorolac (TORADOL) 15 MG/ML injection 15 mg (has no administration in time range)    ED Course/ Medical Decision Making/ A&P                                 Medical Decision Making Risk OTC drugs.   This patient presents to the ED with chief complaint(s) of flulike symptoms with pertinent past medical history of flu which further complicates the presenting complaint. The complaint involves an extensive differential diagnosis and also carries with it a high risk of complications and morbidity.    The differential diagnosis includes influenza, strep throat, COVID  ED Course and Reassessment:   Independent labs interpretation:  The following labs were independently interpreted:  Strep PCR: Negative  Consultation: - Consulted or discussed management/test interpretation w/ external professional: None  Consideration for admission or further workup: Considered for admission with workup however patient's vital signs are stable prior to discharge.  Patient's labs and physical exam have all been reassuring.  Patient symptoms likely due to influenza which was previously diagnosed.  Patient will be given symptomatic treatment outpatient and should follow-up with PCP if symptoms persist.         Final Clinical Impression(s) / ED Diagnoses Final diagnoses:  Influenza    Rx / DC Orders ED Discharge Orders     None         Laura [name] 08/28/23 2023    Benjiman Core, MD 08/28/23 2350

## 2023-08-28 NOTE — ED Triage Notes (Signed)
She was seen herer yesterday for fever/cough and was dx with flu type A. She is here today d/t feel "worse, especially the sore throat".

## 2023-08-31 ENCOUNTER — Emergency Department (HOSPITAL_BASED_OUTPATIENT_CLINIC_OR_DEPARTMENT_OTHER)
Admission: EM | Admit: 2023-08-31 | Discharge: 2023-08-31 | Disposition: A | Discharge status: Home / Self Care | Payer: 59 | Attending: Emergency Medicine | Admitting: Emergency Medicine

## 2023-08-31 ENCOUNTER — Other Ambulatory Visit: Payer: Self-pay

## 2023-08-31 ENCOUNTER — Encounter (HOSPITAL_BASED_OUTPATIENT_CLINIC_OR_DEPARTMENT_OTHER): Payer: Self-pay

## 2023-08-31 DIAGNOSIS — R1110 Vomiting, unspecified: Secondary | ICD-10-CM | POA: Diagnosis present

## 2023-08-31 DIAGNOSIS — J111 Influenza due to unidentified influenza virus with other respiratory manifestations: Secondary | ICD-10-CM | POA: Diagnosis not present

## 2023-08-31 DIAGNOSIS — J45909 Unspecified asthma, uncomplicated: Secondary | ICD-10-CM | POA: Insufficient documentation

## 2023-08-31 DIAGNOSIS — D72829 Elevated white blood cell count, unspecified: Secondary | ICD-10-CM | POA: Insufficient documentation

## 2023-08-31 DIAGNOSIS — Z7951 Long term (current) use of inhaled steroids: Secondary | ICD-10-CM | POA: Insufficient documentation

## 2023-08-31 DIAGNOSIS — R000 Tachycardia, unspecified: Secondary | ICD-10-CM | POA: Diagnosis not present

## 2023-08-31 DIAGNOSIS — Z79899 Other long term (current) drug therapy: Secondary | ICD-10-CM | POA: Diagnosis not present

## 2023-08-31 LAB — COMPREHENSIVE METABOLIC PANEL
ALT: 8 U/L (ref 0–44)
AST: 18 U/L (ref 15–41)
Albumin: 3.4 g/dL — ABNORMAL LOW (ref 3.5–5.0)
Alkaline Phosphatase: 23 U/L — ABNORMAL LOW (ref 38–126)
Anion gap: 8 (ref 5–15)
BUN: 10 mg/dL (ref 6–20)
CO2: 26 mmol/L (ref 22–32)
Calcium: 8.1 mg/dL — ABNORMAL LOW (ref 8.9–10.3)
Chloride: 99 mmol/L (ref 98–111)
Creatinine, Ser: 0.86 mg/dL (ref 0.44–1.00)
GFR, Estimated: >60 mL/min (ref >60)
Glucose, Bld: 98 mg/dL (ref 70–99)
Potassium: 3.4 mmol/L — ABNORMAL LOW (ref 3.5–5.1)
Sodium: 133 mmol/L — ABNORMAL LOW (ref 135–145)
Total Bilirubin: 0.6 mg/dL (ref <1.2)
Total Protein: 6.5 g/dL (ref 6.5–8.1)

## 2023-08-31 LAB — CBC WITH DIFFERENTIAL/PLATELET
Abs Immature Granulocytes: 0.01 10*3/uL (ref 0.00–0.07)
Basophils Absolute: 0 10*3/uL (ref 0.0–0.1)
Basophils Relative: 0 %
Eosinophils Absolute: 0 10*3/uL (ref 0.0–0.5)
Eosinophils Relative: 0 %
HCT: 32.4 % — ABNORMAL LOW (ref 36.0–46.0)
Hemoglobin: 10.4 g/dL — ABNORMAL LOW (ref 12.0–15.0)
Immature Granulocytes: 0 %
Lymphocytes Relative: 20 %
Lymphs Abs: 0.8 10*3/uL (ref 0.7–4.0)
MCH: 23 pg — ABNORMAL LOW (ref 26.0–34.0)
MCHC: 32.1 g/dL (ref 30.0–36.0)
MCV: 71.7 fL — ABNORMAL LOW (ref 80.0–100.0)
Monocytes Absolute: 0.4 10*3/uL (ref 0.1–1.0)
Monocytes Relative: 11 %
Neutro Abs: 2.7 10*3/uL (ref 1.7–7.7)
Neutrophils Relative %: 69 %
Platelets: 103 10*3/uL — ABNORMAL LOW (ref 150–400)
RBC: 4.52 MIL/uL (ref 3.87–5.11)
RDW: 13.4 % (ref 11.5–15.5)
WBC: 3.9 10*3/uL — ABNORMAL LOW (ref 4.0–10.5)
nRBC: 0 % (ref 0.0–0.2)

## 2023-08-31 MED ORDER — ACETAMINOPHEN 500 MG PO TABS
1000.0000 mg | ORAL_TABLET | Freq: Once | ORAL | Status: AC
  Administered 2023-08-31: 1000 mg via ORAL
  Filled 2023-08-31: qty 2

## 2023-08-31 MED ORDER — ONDANSETRON HCL 4 MG/2ML IJ SOLN
4.0000 mg | Freq: Once | INTRAMUSCULAR | Status: AC
Start: 2023-08-31 — End: 2023-08-31
  Administered 2023-08-31: 4 mg via INTRAVENOUS
  Filled 2023-08-31: qty 2

## 2023-08-31 MED ORDER — LACTATED RINGERS IV BOLUS
1000.0000 mL | Freq: Once | INTRAVENOUS | Status: AC
  Administered 2023-08-31: 1000 mL via INTRAVENOUS

## 2023-08-31 NOTE — ED Notes (Signed)
Per PA Edyth Gunnels took out the pt. IV. Notified PA Edyth Gunnels of patient leaving.

## 2023-08-31 NOTE — ED Notes (Signed)
Pt. Not found in room and Laura [name] states she thought she seen her leave.

## 2023-08-31 NOTE — ED Provider Notes (Signed)
EMERGENCY DEPARTMENT AT [facility] Provider Note   CSN: [id number] Arrival date & time: 08/31/23  1049     History  Chief Complaint  Patient presents with   Influenza   Emesis    Laura [name] is a 22 y.o. female with a past medical history of asthma presents to emergency department for evaluation of malaise, decreased appetite, vomiting (3X a day), generalized myalgia since 08/24/2023.  She reports that she normally takes her inhaler at night and has been using it more often at night since symptoms started.  She does not know if she has fevers at home but has been able to keep fluids down mostly.   Influenza Presenting symptoms: cough and vomiting   Presenting symptoms: no diarrhea, no fatigue, no fever, no headaches, no nausea, no shortness of breath and no sore throat   Associated symptoms: nasal congestion   Associated symptoms: no chills   Emesis Associated symptoms: cough   Associated symptoms: no abdominal pain, no chills, no diarrhea, no fever, no headaches and no sore throat       Home Medications Prior to Admission medications   Medication Sig Start Date End Date Taking? Authorizing Provider  acetaminophen (TYLENOL) 500 MG tablet Take 1 tablet (500 mg total) by mouth every 6 (six) hours as needed. 06/07/23   Small, Brooke L, PA  albuterol (PROVENTIL) (2.5 MG/3ML) 0.083% nebulizer solution Take 3 mLs (2.5 mg total) by nebulization every 6 (six) hours as needed for wheezing or shortness of breath. 01/27/22   Roemhildt, Lorin T, PA-C  albuterol (VENTOLIN HFA) 108 (90 Base) MCG/ACT inhaler Inhale 1-2 puffs into the lungs every 6 (six) hours as needed for wheezing or shortness of breath.    [provider]  busPIRone (BUSPAR) 15 MG tablet Take by mouth. 07/13/23   [provider]  escitalopram (LEXAPRO) 10 MG tablet Take 5 mg by mouth at bedtime. 06/17/22   [provider]  levonorgestrel (MIRENA, 52 MG,) 20 MCG/DAY IUD to be  inserted by provider in office at postpartum visit ICD 10 Z30.42 10/22/21   [provider]  metoCLOPramide (REGLAN) 10 MG tablet Take 1 tablet (10 mg total) by mouth every 6 (six) hours. 08/27/23   Rondel Baton, MD  ondansetron (ZOFRAN) 4 MG tablet Take 1 tablet (4 mg total) by mouth every 6 (six) hours. 08/16/23   Rising, Lurena Joiner, PA-C      Allergies    Patient has no known allergies.    Review of Systems   Review of Systems  Constitutional:  Negative for chills, fatigue and fever.  HENT:  Positive for congestion. Negative for sinus pressure, sore throat, trouble swallowing and voice change.   Respiratory:  Positive for cough. Negative for chest tightness, shortness of breath and wheezing.   Cardiovascular:  Negative for chest pain and palpitations.  Gastrointestinal:  Positive for vomiting. Negative for abdominal pain, constipation, diarrhea and nausea.  Neurological:  Negative for dizziness, seizures, weakness, light-headedness, numbness and headaches.    Physical Exam Updated Vital Signs BP 108/72   Pulse (!) 101   Temp 99.8 F (37.7 C) (Oral)   Resp 18   Ht 5\' 3"  (1.6 m)   Wt 60 kg   LMP  (LMP Unknown)   SpO2 100%   BMI 23.43 kg/m  Physical Exam Vitals and nursing note reviewed.  Constitutional:      General: She is not in acute distress.    Appearance: Normal appearance. She is not  toxic-appearing.  HENT:     Head: Normocephalic and atraumatic.     Right Ear: Tympanic membrane, ear canal and external ear normal.     Left Ear: Tympanic membrane, ear canal and external ear normal.     Nose: Congestion present.     Mouth/Throat:     Mouth: Mucous membranes are moist.     Pharynx: No oropharyngeal exudate or posterior oropharyngeal erythema.     Comments: Uvula midline. No abscess, fluctuance, erythema noted in mouth Eyes:     General: No scleral icterus.       Right eye: No discharge.        Left eye: No discharge.     Extraocular Movements:  Extraocular movements intact.     Conjunctiva/sclera: Conjunctivae normal.     Pupils: Pupils are equal, round, and reactive to light.  Cardiovascular:     Rate and Rhythm: Tachycardia present.     Pulses: Normal pulses.  Pulmonary:     Effort: Pulmonary effort is normal. No respiratory distress.     Breath sounds: Normal breath sounds. No stridor. No wheezing or rhonchi.  Chest:     Chest wall: No tenderness.  Abdominal:     General: Bowel sounds are normal. There is no distension.     Palpations: Abdomen is soft. There is no mass.     Tenderness: There is no abdominal tenderness. There is no guarding.  Musculoskeletal:     Cervical back: Normal range of motion and neck supple. No rigidity or tenderness.     Right lower leg: No edema.     Left lower leg: No edema.  Lymphadenopathy:     Cervical: No cervical adenopathy.  Skin:    General: Skin is warm.     Capillary Refill: Capillary refill takes less than 2 seconds.     Coloration: Skin is not jaundiced or pale.  Neurological:     Mental Status: She is alert and oriented to person, place, and time. Mental status is at baseline.    ED Results / Procedures / Treatments   Labs (all labs ordered are listed, but only abnormal results are displayed) Labs Reviewed  CBC WITH DIFFERENTIAL/PLATELET - Abnormal; Notable for the following components:      Result Value   WBC 3.9 (*)    Hemoglobin 10.4 (*)    HCT 32.4 (*)    MCV 71.7 (*)    MCH 23.0 (*)    Platelets 103 (*)    All other components within normal limits  COMPREHENSIVE METABOLIC PANEL - Abnormal; Notable for the following components:   Sodium 133 (*)    Potassium 3.4 (*)    Calcium 8.1 (*)    Albumin 3.4 (*)    Alkaline Phosphatase 23 (*)    All other components within normal limits  PREGNANCY, URINE  URINALYSIS, ROUTINE W REFLEX MICROSCOPIC    EKG None  Radiology No results found.  Procedures Procedures    Medications Ordered in ED Medications   ondansetron (ZOFRAN) injection 4 mg (4 mg Intravenous Given 08/31/23 1151)  lactated ringers bolus 1,000 mL (1,000 mLs Intravenous New Bag/Given 08/31/23 1151)  acetaminophen (TYLENOL) tablet 1,000 mg (1,000 mg Oral Given 08/31/23 1154)    ED Course/ Medical Decision Making/ A&P                                 Medical Decision Making Amount and/or Complexity of  Data Reviewed Labs: ordered.  Risk OTC drugs. Prescription drug management.   Patient presents to the ED for concern of worsening flu symptoms , this involves an extensive number of treatment options, and is a complaint that carries with it a high risk of complications and morbidity.   Co morbidities that complicate the patient evaluation  Recent diagnosis of flu   Additional history obtained:  Additional history obtained from Nursing and Outside Medical Records   External records from outside source obtained and reviewed including ED visit from 08/27/2023 and 08/28/2023 for influenza Influenza positive on 08/27/2023 Strep neg on 08/28/2023   Lab Tests:  I Ordered, and personally interpreted labs.  The pertinent results include:  mild leukocytosis of 3.9, mild anemia 10.4 Ordered pregnancy urine and UA however they were collected and not resulted prior to patient eloping from ED    Medicines ordered and prescription drug management:  I ordered medication including Tylenol, IVF, Zofran  Reevaluation of the patient after these medicines showed that the patient improved I have reviewed the patients home medicines and have made adjustments as needed   Test Considered:  CXR considered however patient does not have significant leukocytosis or leukopenia.  She is not toxic appearing.  She reports improvement following supportive care in ED.  Lung sounds CTAB.  Shared decision making is had regarding getting chest x-ray however patient eloped from ED prior to discussion   Problem List / ED  Course:  Influenza   Reevaluation:  After the interventions noted above, I reevaluated the patient and found that they have :improved   Social Determinants of Health:  Has PCP   Dispostion:  After consideration of the diagnostic results and the patients response to treatment, I feel that the patent would most likely benefit from outpatient management. I was able to speak to patient while taking her IV out as she was complaining of arm pain and desire to have IV out. She endorsed improvement of symptoms. We were still waiting on UA and hCG. However, prior to discussing disposition, findings, outpatient supportive care, patient eloped from ED.  No discharge paperwork was provided.  No return to emergency department precautions were provided.        Final Clinical Impression(s) / ED Diagnoses Final diagnoses:  Influenza    Rx / DC Orders ED Discharge Orders     None         Judithann Sheen, PA 08/31/23 1755    Alvira Monday, MD 09/01/23 0028

## 2023-08-31 NOTE — ED Triage Notes (Signed)
Patient arrives with complaints of worsening vomiting and chills related to being Flu A + Patient states that her symptoms have worsened and she can not keep anything down by mouth.  Rates body pain a 9/10.

## 2023-09-27 ENCOUNTER — Ambulatory Visit (INDEPENDENT_AMBULATORY_CARE_PROVIDER_SITE_OTHER): Payer: 59

## 2023-09-27 ENCOUNTER — Ambulatory Visit (HOSPITAL_COMMUNITY)
Admission: EM | Admit: 2023-09-27 | Discharge: 2023-09-27 | Disposition: A | Discharge status: Home / Self Care | Payer: 59 | Attending: Family Medicine | Admitting: Family Medicine

## 2023-09-27 ENCOUNTER — Encounter (HOSPITAL_COMMUNITY): Payer: Self-pay

## 2023-09-27 DIAGNOSIS — M546 Pain in thoracic spine: Secondary | ICD-10-CM

## 2023-09-27 DIAGNOSIS — M542 Cervicalgia: Secondary | ICD-10-CM | POA: Diagnosis not present

## 2023-09-27 MED ORDER — IBUPROFEN 800 MG PO TABS: 800.0000 mg | ORAL_TABLET | Freq: Three times a day (TID) | ORAL | 0 refills | Status: DC | PRN

## 2023-09-27 MED ORDER — TIZANIDINE HCL 4 MG PO TABS
4.0000 mg | ORAL_TABLET | Freq: Three times a day (TID) | ORAL | 0 refills | Status: DC | PRN
Start: 2023-09-27 — End: 2023-12-28

## 2023-09-27 NOTE — ED Triage Notes (Signed)
Patient states she fall down onto metal steps 3 days ago. No LOC.Patient did not hit her head. Patient reports neck and back pain.

## 2023-09-27 NOTE — ED Provider Notes (Signed)
[facility]    CSN: [id number] Arrival date & time: 09/27/23  1051      History   Chief Complaint Chief Complaint  Patient presents with   Back Pain   Neck Pain    HPI Laura [name] is a 22 y.o. female.    Back Pain Neck Pain Here for neck and mid back pain.  3 days ago she slipped and fell back onto some metal steps.  She fell onto her neck and upper back.  There  Tylenol has not been helping  She hurts in her neck toward the right of the spine and on the right lower thoracic area posteriorly  Last menstrual cycle was December 28 and she is on the Depo-Provera injection  NKDA  Past Medical History:  Diagnosis Date   Anemia    Anxiety    Asthma    Depression    Genital herpes    Ovarian cyst     There are no active problems to display for this patient.   Past Surgical History:  Procedure Laterality Date   NO PAST SURGERIES     OVARIAN CYST REMOVAL      OB History     Gravida  3   Para  2   Term  2   Preterm      AB      Living  2      SAB      IAB      Ectopic      Multiple      Live Births  2            Home Medications    Prior to Admission medications   Medication Sig Start Date End Date Taking? Authorizing Provider  acetaminophen (TYLENOL) 500 MG tablet Take 1 tablet (500 mg total) by mouth every 6 (six) hours as needed. 06/07/23  Yes Small, Brooke L, PA  albuterol (PROVENTIL) (2.5 MG/3ML) 0.083% nebulizer solution Take 3 mLs (2.5 mg total) by nebulization every 6 (six) hours as needed for wheezing or shortness of breath. 01/27/22  Yes Roemhildt, Lorin T, PA-C  albuterol (VENTOLIN HFA) 108 (90 Base) MCG/ACT inhaler Inhale 1-2 puffs into the lungs every 6 (six) hours as needed for wheezing or shortness of breath.   Yes [provider]  busPIRone (BUSPAR) 15 MG tablet Take by mouth. 07/13/23  Yes [provider]  escitalopram (LEXAPRO) 10 MG tablet Take 5 mg by mouth at bedtime. 06/17/22  Yes  [provider]  ibuprofen (ADVIL) 800 MG tablet Take 1 tablet (800 mg total) by mouth every 8 (eight) hours as needed (pain). 09/27/23  Yes Zenia Resides, MD  levonorgestrel (MIRENA, 52 MG,) 20 MCG/DAY IUD to be inserted by provider in office at postpartum visit ICD 10 Z30.42 10/22/21  Yes [provider]  metoCLOPramide (REGLAN) 10 MG tablet Take 1 tablet (10 mg total) by mouth every 6 (six) hours. 08/27/23  Yes Rondel Baton, MD  ondansetron (ZOFRAN) 4 MG tablet Take 1 tablet (4 mg total) by mouth every 6 (six) hours. 08/16/23  Yes Rising, Lurena Joiner, PA-C  tiZANidine (ZANAFLEX) 4 MG tablet Take 1 tablet (4 mg total) by mouth every 8 (eight) hours as needed for muscle spasms. 09/27/23  Yes Zenia Resides, MD    Family History Family History  Problem Relation Age of Onset   Epilepsy Father     Social History Social History   Tobacco Use   Smoking status: Former  Current packs/day: 0.00    Types: Cigarettes, Cigars    Quit date: 10/07/2018    Years since quitting: 4.9   Smokeless tobacco: Never  Vaping Use   Vaping status: Some Days  Substance Use Topics   Alcohol use: Yes   Drug use: No     Allergies   Patient has no known allergies.   Review of Systems Review of Systems  Musculoskeletal:  Positive for back pain and neck pain.     Physical Exam Triage Vital Signs ED Triage Vitals [09/27/23 1212]  Encounter Vitals Group     BP 104/73     Systolic BP Percentile      Diastolic BP Percentile      Pulse Rate 68     Resp 16     Temp 98.2 F (36.8 C)     Temp Source Oral     SpO2 96 %     Weight      Height      Head Circumference      Peak Flow      Pain Score      Pain Loc      Pain Education      Exclude from Growth Chart    No data found.  Updated Vital Signs BP 104/73 (BP Location: Left Arm)   Pulse 68   Temp 98.2 F (36.8 C) (Oral)   Resp 16   LMP 09/27/2023   SpO2 96%   Visual Acuity Right Eye Distance:   Left  Eye Distance:   Bilateral Distance:    Right Eye Near:   Left Eye Near:    Bilateral Near:     Physical Exam Vitals reviewed.  Constitutional:      General: She is not in acute distress.    Appearance: She is not ill-appearing, toxic-appearing or diaphoretic.  HENT:     Mouth/Throat:     Mouth: Mucous membranes are moist.  Eyes:     Extraocular Movements: Extraocular movements intact.     Pupils: Pupils are equal, round, and reactive to light.  Cardiovascular:     Rate and Rhythm: Normal rate and regular rhythm.     Heart sounds: No murmur heard. Pulmonary:     Effort: Pulmonary effort is normal.     Breath sounds: Normal breath sounds.     Comments: She is tender over the right lower thoracic area.  No bruising or deformity or swelling there. Musculoskeletal:     Cervical back: Neck supple.  Lymphadenopathy:     Cervical: No cervical adenopathy.  Skin:    Coloration: Skin is not jaundiced or pale.  Neurological:     General: No focal deficit present.     Mental Status: She is alert and oriented to person, place, and time.  Psychiatric:        Behavior: Behavior normal.      UC Treatments / Results  Labs (all labs ordered are listed, but only abnormal results are displayed) Labs Reviewed - No data to display  EKG   Radiology DG Cervical Spine 2-3 Views Result Date: 09/27/2023 CLINICAL DATA:  22 year old female status post fall on steps 3 days ago. Continued pain. EXAM: CERVICAL SPINE - 2-3 VIEW COMPARISON:  None Available. FINDINGS: Normal prevertebral soft tissue contour. Mild reversal of cervical lordosis. Cervicothoracic junction alignment is within normal limits. Hair artifact on the AP and open-mouth views. AP alignment appears normal. Normal C1-C2 alignment, joint spaces. Maintained disc spaces. No acute osseous abnormality  identified. Negative visible thoracic inlet. IMPRESSION: No osseous abnormality identified in the cervical spine. Nonspecific reversal of  lordosis. Electronically Signed   By: Odessa Fleming M.D.   On: 09/27/2023 12:59   DG Ribs Unilateral W/Chest Right Result Date: 09/27/2023 CLINICAL DATA:  22 year old female status post fall on steps 3 days ago. Continued pain. EXAM: RIGHT RIBS AND CHEST - 3+ VIEW COMPARISON:  Chest radiographs 08/27/2023. FINDINGS: PA view of the chest at 1240 hours. Normal lung volumes and mediastinal contours. Visualized tracheal air column is within normal limits. Both lungs appear stable and clear. No pneumothorax or pleural effusion. Negative visible bowel gas. Bone mineralization is within normal limits. Mild thoracolumbar scoliosis. Two views of the right ribs with rib marker placed at the posterior left 8th/9th rib level. Hypoplastic 12th ribs. No rib fracture identified. Other visible osseous structures appear grossly intact. IMPRESSION: 1. No right rib fracture identified. 2. No cardiopulmonary abnormality. 3. Mild thoracolumbar scoliosis. Electronically Signed   By: Odessa Fleming M.D.   On: 09/27/2023 12:57    Procedures Procedures (including critical care time)  Medications Ordered in UC Medications - No data to display  Initial Impression / Assessment and Plan / UC Course  I have reviewed the triage vital signs and the nursing notes.  Pertinent labs & imaging results that were available during my care of the patient were reviewed by me and considered in my medical decision making (see chart for details).     No rib fracture and no bony abnormality of the c spine, though there is loss of the normal lordosis. She declines my offer of a Toradol injection  Ibuprofen sent in for pain. Tizanidine sent in for muscle spasm Final Clinical Impressions(s) / UC Diagnoses   Final diagnoses:  Neck pain  Acute right-sided thoracic back pain     Discharge Instructions      There are no broken bones on your neck x-rays or rib x-rays.  Take ibuprofen 800 mg--1 tab every 8 hours as needed for pain.   Take  tizanidine 4 mg--1 every 8 hours as needed for muscle spasms; this medication can cause dizziness and sleepiness       ED Prescriptions     Medication Sig Dispense Auth. Provider   ibuprofen (ADVIL) 800 MG tablet Take 1 tablet (800 mg total) by mouth every 8 (eight) hours as needed (pain). 21 tablet Adonica Fukushima, Janace Aris, MD   tiZANidine (ZANAFLEX) 4 MG tablet Take 1 tablet (4 mg total) by mouth every 8 (eight) hours as needed for muscle spasms. 15 tablet [place] Depaz, Janace Aris, MD      PDMP not reviewed this encounter.   Zenia Resides, MD 09/27/23 [phone]

## 2023-09-27 NOTE — Discharge Instructions (Signed)
There are no broken bones on your neck x-rays or rib x-rays.  Take ibuprofen 800 mg--1 tab every 8 hours as needed for pain.   Take tizanidine 4 mg--1 every 8 hours as needed for muscle spasms; this medication can cause dizziness and sleepiness

## 2023-10-07 IMAGING — DX DG CHEST 2V
2 series · 2 of 2 positions shown · non-contrast
Comparison: 11/06/2021

CLINICAL DATA: COVID short of breath

EXAM:
CHEST - 2 VIEW

[chest pa]
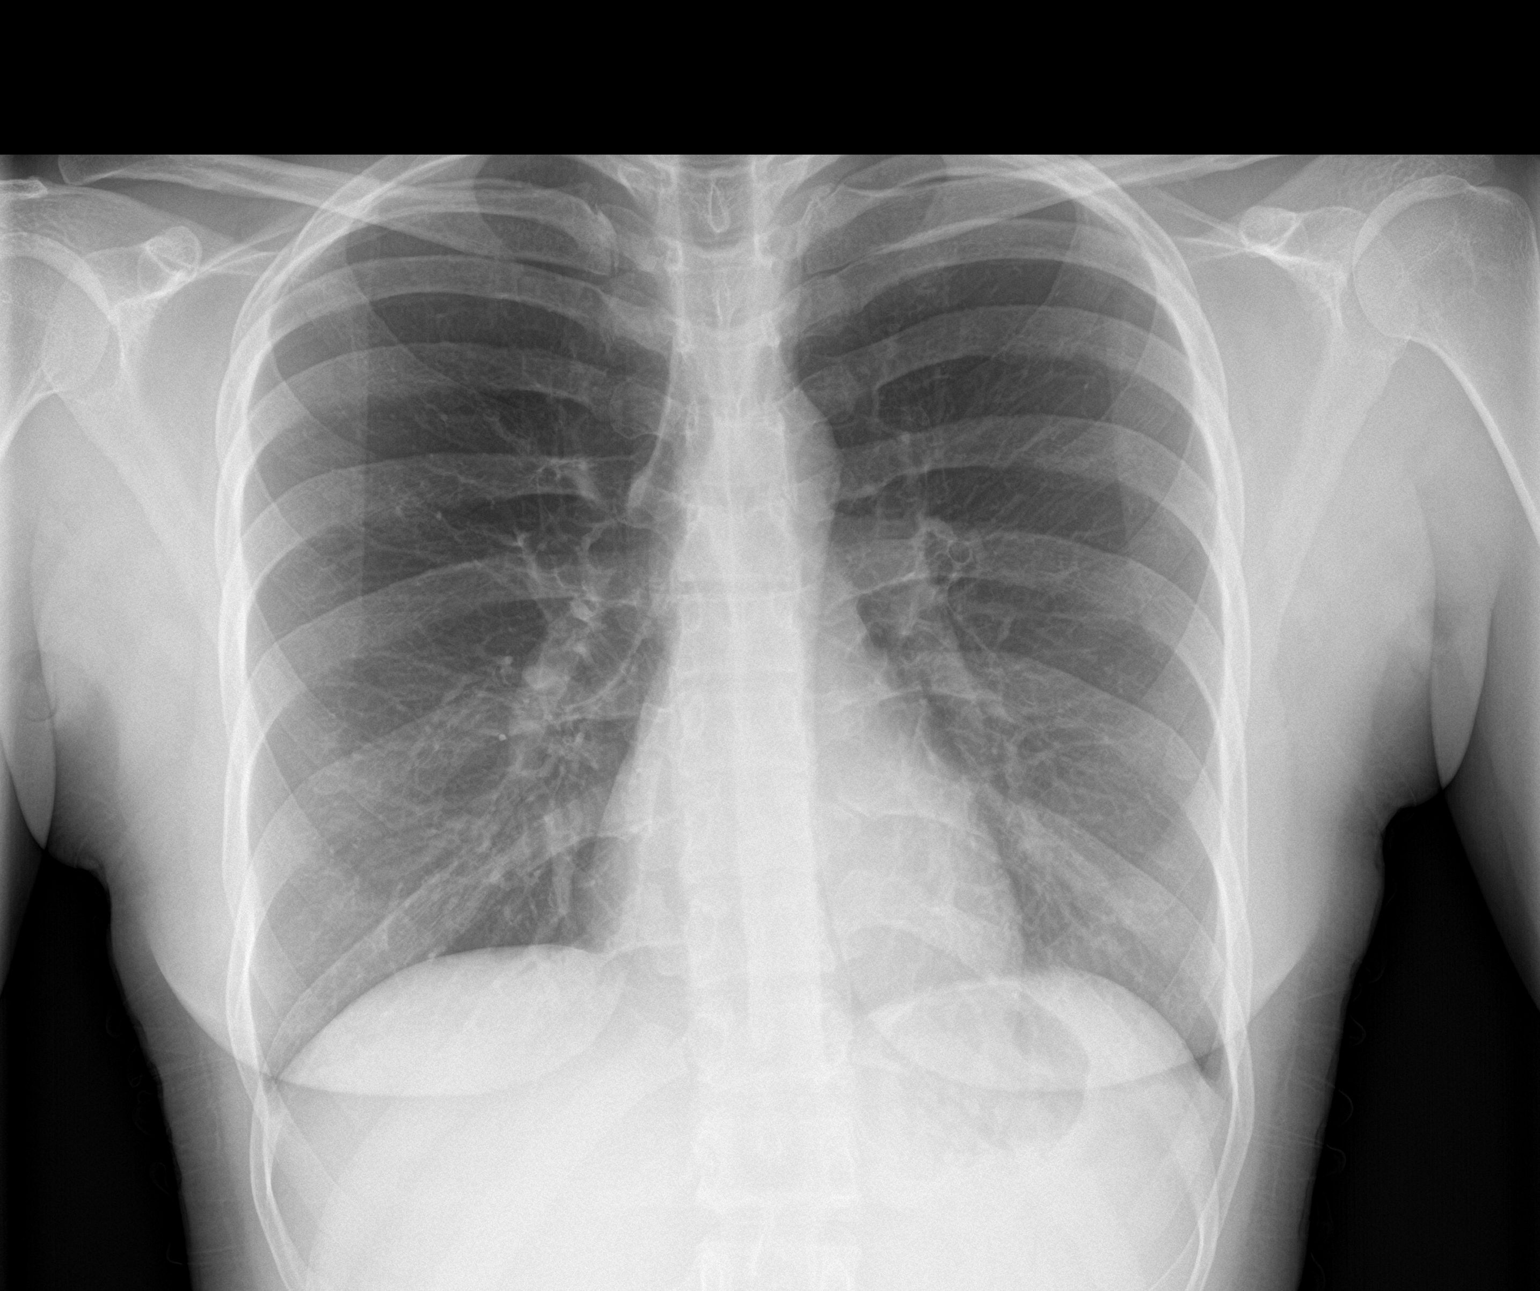

[chest lat]
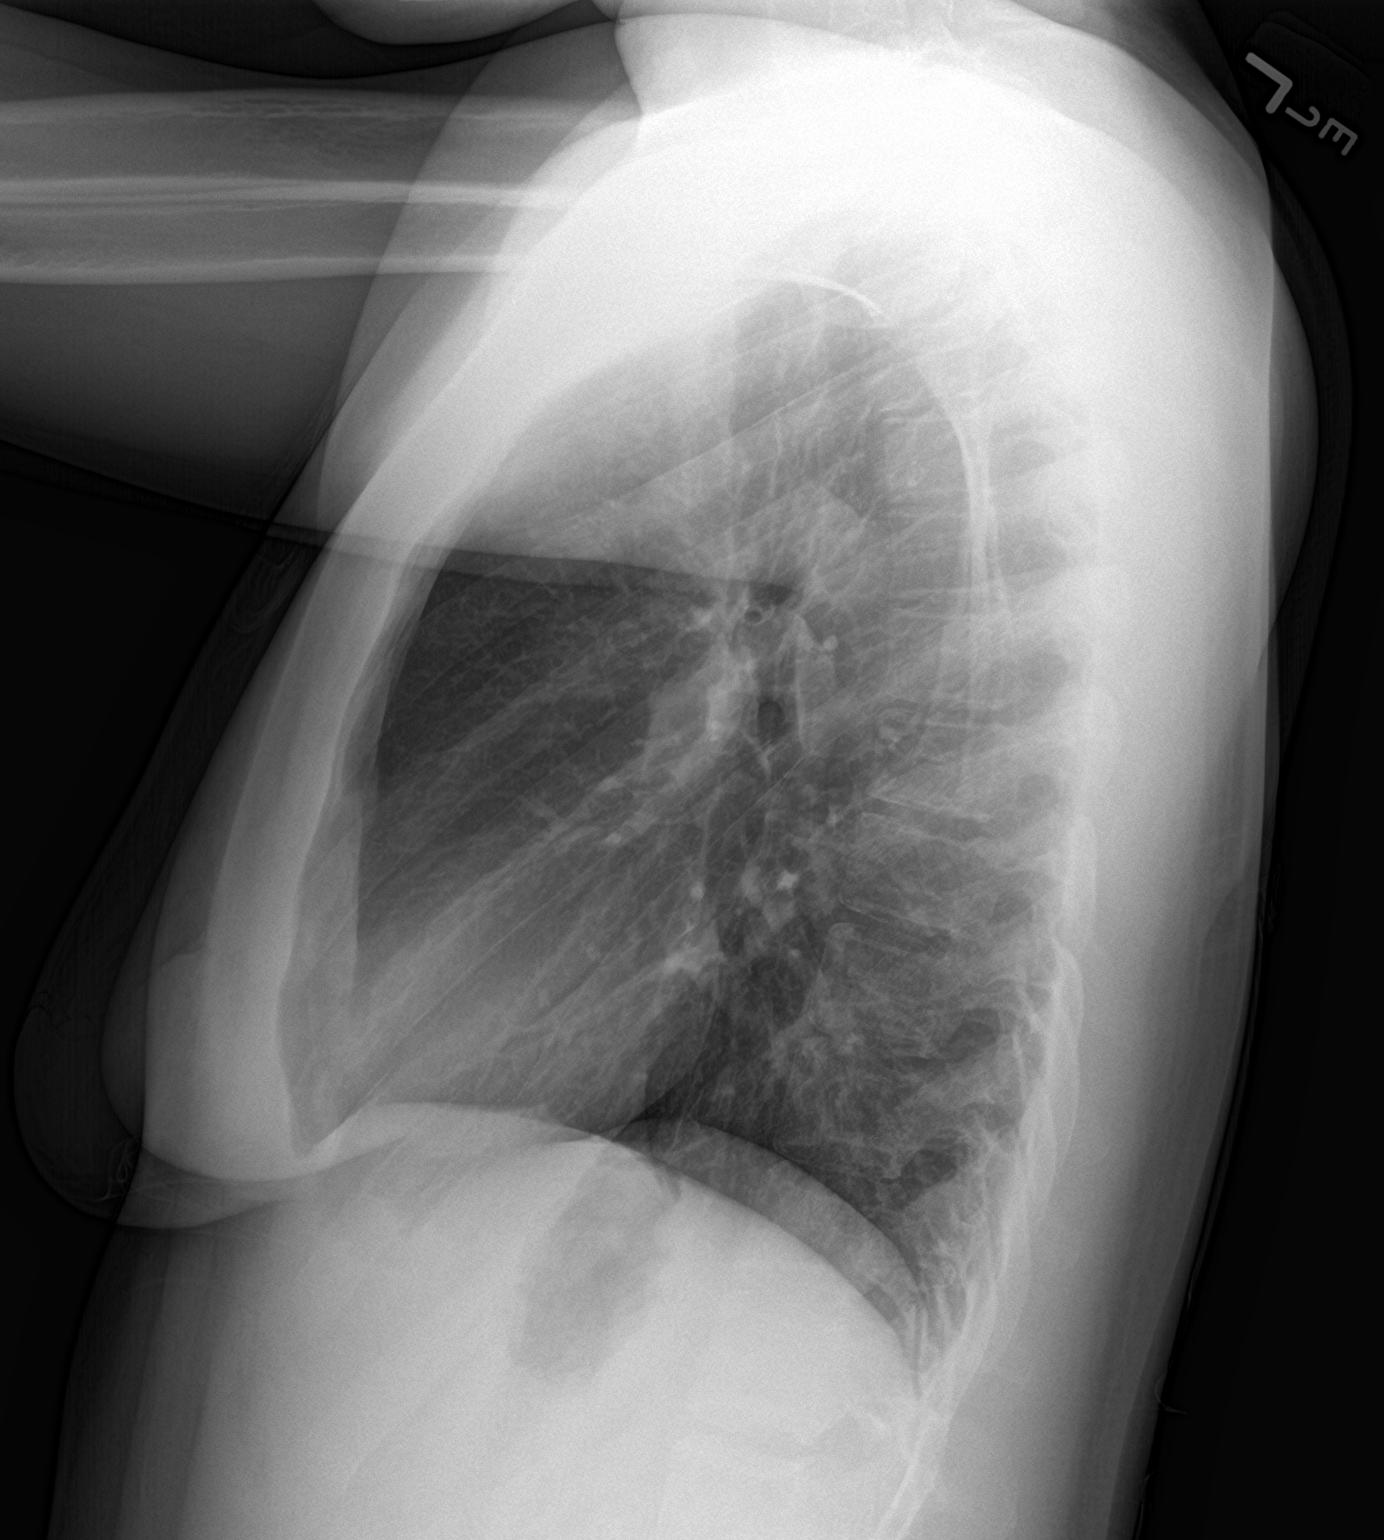

[2 of 2 positions shown; findings below may reference images not displayed]

FINDINGS: The heart size and mediastinal contours are within normal limits.
Both lungs are clear. The visualized skeletal structures are
unremarkable.
IMPRESSION: No active cardiopulmonary disease.

## 2023-10-26 ENCOUNTER — Other Ambulatory Visit: Payer: Self-pay

## 2023-10-26 ENCOUNTER — Encounter (HOSPITAL_BASED_OUTPATIENT_CLINIC_OR_DEPARTMENT_OTHER): Payer: Self-pay | Admitting: Emergency Medicine

## 2023-10-26 ENCOUNTER — Emergency Department (HOSPITAL_BASED_OUTPATIENT_CLINIC_OR_DEPARTMENT_OTHER)
Admission: EM | Admit: 2023-10-26 | Discharge: 2023-10-26 | Discharge status: Against Medical Advice | Payer: 59 | Attending: Emergency Medicine | Admitting: Emergency Medicine

## 2023-10-26 DIAGNOSIS — F419 Anxiety disorder, unspecified: Secondary | ICD-10-CM | POA: Insufficient documentation

## 2023-10-26 DIAGNOSIS — Z5321 Procedure and treatment not carried out due to patient leaving prior to being seen by health care provider: Secondary | ICD-10-CM | POA: Insufficient documentation

## 2023-10-26 NOTE — ED Triage Notes (Signed)
Pt to ED via EMS from home c/o anxiety.  States had a lot going on recently and grandfather dx with cancer.  States takes lexapro and buspar at home but has not helped tonight.  Pt usually has support person at home to help but did not tonight.  Denies alcohol or other drugs.  Also states got dizzy today and fell back hitting posterior head without LOC on bathroom floor earlier.  Pt denies SI/HI or A/V hallucinations.

## 2023-10-26 NOTE — ED Notes (Signed)
No answer when called times 3.

## 2023-11-09 ENCOUNTER — Telehealth: Payer: 59 | Admitting: Physician Assistant

## 2023-11-09 DIAGNOSIS — A601 Herpesviral infection of perianal skin and rectum: Secondary | ICD-10-CM

## 2023-11-09 MED ORDER — VALACYCLOVIR HCL 1 G PO TABS: 1000.0000 mg | ORAL_TABLET | Freq: Two times a day (BID) | ORAL | 0 refills | Status: AC

## 2023-11-09 NOTE — Progress Notes (Signed)
 Virtual Visit Consent   Laura [name], you are scheduled for a virtual visit with a [facility] provider today. Just as with appointments in the office, your consent must be obtained to participate. Your consent will be active for this visit and any virtual visit you may have with one of our providers in the next 365 days. If you have a MyChart account, a copy of this consent can be sent to you electronically.  As this is a virtual visit, video technology does not allow for your provider to perform a traditional examination. This may limit your provider's ability to fully assess your condition. If your provider identifies any concerns that need to be evaluated in person or the need to arrange testing (such as labs, EKG, etc.), we will make arrangements to do so. Although advances in technology are sophisticated, we cannot ensure that it will always work on either your end or our end. If the connection with a video visit is poor, the visit may have to be switched to a telephone visit. With either a video or telephone visit, we are not always able to ensure that we have a secure connection.  By engaging in this virtual visit, you consent to the provision of healthcare and authorize for your insurance to be billed (if applicable) for the services provided during this visit. Depending on your insurance coverage, you may receive a charge related to this service.  I need to obtain your verbal consent now. Are you willing to proceed with your visit today? Laura [name] has provided verbal consent on 11/09/2023 for a virtual visit (video or telephone). Angelia Kelp, PA-C  Date: 11/09/2023 3:52 PM   Virtual Visit via Video Note   IAngelia Kelp, connected with  Laura [name]  ([id number], [birth year]) on 11/09/23 at  4:00 PM EST by a video-enabled telemedicine application and verified that I am speaking with the correct person using two identifiers.  Location: Patient: Virtual Visit  Location Patient: Home Provider: Virtual Visit Location Provider: Home Office   I discussed the limitations of evaluation and management by telemedicine and the availability of in person appointments. The patient expressed understanding and agreed to proceed.    History of Present Illness: Laura [name] is a 23 y.o. who identifies as a female who was assigned female at birth, and is being seen today for herpes around the anus. Started about 2 days ago. Mildly painful. Does have a history of genital herpes. Denies any signs of infection. No fevers, chills, nausea, vomiting.   Problems: There are no active problems to display for this patient.   Allergies: No Known Allergies Medications:  Current Outpatient Medications:    acetaminophen  (TYLENOL ) 500 MG tablet, Take 1 tablet (500 mg total) by mouth every 6 (six) hours as needed., Disp: 30 tablet, Rfl: 0   albuterol  (PROVENTIL ) (2.5 MG/3ML) 0.083% nebulizer solution, Take 3 mLs (2.5 mg total) by nebulization every 6 (six) hours as needed for wheezing or shortness of breath., Disp: 75 mL, Rfl: 12   albuterol  (VENTOLIN  HFA) 108 (90 Base) MCG/ACT inhaler, Inhale 1-2 puffs into the lungs every 6 (six) hours as needed for wheezing or shortness of breath., Disp: , Rfl:    busPIRone (BUSPAR) 15 MG tablet, Take by mouth., Disp: , Rfl:    escitalopram (LEXAPRO) 10 MG tablet, Take 5 mg by mouth at bedtime., Disp: , Rfl:    ibuprofen  (ADVIL ) 800 MG tablet, Take 1 tablet (800 mg total) by mouth every 8 (eight)  hours as needed (pain)., Disp: 21 tablet, Rfl: 0   levonorgestrel (MIRENA, 52 MG,) 20 MCG/DAY IUD, to be inserted by provider in office at postpartum visit ICD 10 Z30.42, Disp: , Rfl:    metoCLOPramide  (REGLAN ) 10 MG tablet, Take 1 tablet (10 mg total) by mouth every 6 (six) hours., Disp: 12 tablet, Rfl: 0   ondansetron  (ZOFRAN ) 4 MG tablet, Take 1 tablet (4 mg total) by mouth every 6 (six) hours., Disp: 12 tablet, Rfl: 0   tiZANidine  (ZANAFLEX ) 4 MG  tablet, Take 1 tablet (4 mg total) by mouth every 8 (eight) hours as needed for muscle spasms., Disp: 15 tablet, Rfl: 0   valACYclovir  (VALTREX ) 1000 MG tablet, Take 1 tablet (1,000 mg total) by mouth 2 (two) times daily for 10 days., Disp: 20 tablet, Rfl: 0  Observations/Objective: Patient is well-developed, well-nourished in no acute distress.  Resting comfortably at home.  Head is normocephalic, atraumatic.  No labored breathing.  Speech is clear and coherent with logical content.  Patient is alert and oriented at baseline.    Assessment and Plan: 1. Herpes simplex infection of rectum (Primary) - valACYclovir  (VALTREX ) 1000 MG tablet; Take 1 tablet (1,000 mg total) by mouth 2 (two) times daily for 10 days.  Dispense: 20 tablet; Refill: 0  - Valacyclovir  prescribed - Epsom salt soaks/Sitz baths - Tylenol  or Ibuprofen  if needed for pain - Safe sex practices - Seek in person evaluation if not improving or worsens    Follow Up Instructions: I discussed the assessment and treatment plan with the patient. The patient was provided an opportunity to ask questions and all were answered. The patient agreed with the plan and demonstrated an understanding of the instructions.  A copy of instructions were sent to the patient via MyChart unless otherwise noted below.    The patient was advised to call back or seek an in-person evaluation if the symptoms worsen or if the condition fails to improve as anticipated.    Angelia Kelp, PA-C

## 2023-11-09 NOTE — Patient Instructions (Signed)
 Rosalita Combe, thank you for joining Angelia Kelp, PA-C for today's virtual visit.  While this provider is not your primary care provider (PCP), if your PCP is located in our provider database this encounter information will be shared with them immediately following your visit.   A [facility] MyChart account gives you access to today's visit and all your visits, tests, and labs performed at [facility] " click here if you don't have a [facility] MyChart account or go to [email or web address]  Consent: (Patient) Juliann Inacio provided verbal consent for this virtual visit at the beginning of the encounter.  Current Medications:  Current Outpatient Medications:    acetaminophen  (TYLENOL ) 500 MG tablet, Take 1 tablet (500 mg total) by mouth every 6 (six) hours as needed., Disp: 30 tablet, Rfl: 0   albuterol  (PROVENTIL ) (2.5 MG/3ML) 0.083% nebulizer solution, Take 3 mLs (2.5 mg total) by nebulization every 6 (six) hours as needed for wheezing or shortness of breath., Disp: 75 mL, Rfl: 12   albuterol  (VENTOLIN  HFA) 108 (90 Base) MCG/ACT inhaler, Inhale 1-2 puffs into the lungs every 6 (six) hours as needed for wheezing or shortness of breath., Disp: , Rfl:    busPIRone (BUSPAR) 15 MG tablet, Take by mouth., Disp: , Rfl:    escitalopram (LEXAPRO) 10 MG tablet, Take 5 mg by mouth at bedtime., Disp: , Rfl:    ibuprofen  (ADVIL ) 800 MG tablet, Take 1 tablet (800 mg total) by mouth every 8 (eight) hours as needed (pain)., Disp: 21 tablet, Rfl: 0   levonorgestrel (MIRENA, 52 MG,) 20 MCG/DAY IUD, to be inserted by provider in office at postpartum visit ICD 10 Z30.42, Disp: , Rfl:    metoCLOPramide  (REGLAN ) 10 MG tablet, Take 1 tablet (10 mg total) by mouth every 6 (six) hours., Disp: 12 tablet, Rfl: 0   ondansetron  (ZOFRAN ) 4 MG tablet, Take 1 tablet (4 mg total) by mouth every 6 (six) hours., Disp: 12 tablet, Rfl: 0   tiZANidine  (ZANAFLEX ) 4 MG tablet, Take 1 tablet (4 mg total)  by mouth every 8 (eight) hours as needed for muscle spasms., Disp: 15 tablet, Rfl: 0   valACYclovir  (VALTREX ) 1000 MG tablet, Take 1 tablet (1,000 mg total) by mouth 2 (two) times daily for 10 days., Disp: 20 tablet, Rfl: 0   Medications ordered in this encounter:  Meds ordered this encounter  Medications   valACYclovir  (VALTREX ) 1000 MG tablet    Sig: Take 1 tablet (1,000 mg total) by mouth 2 (two) times daily for 10 days.    Dispense:  20 tablet    Refill:  0    Supervising Provider:   LAMPTEY, PHILIP O [[id number]]     *If you need refills on other medications prior to your next appointment, please contact your pharmacy*  Follow-Up: Call back or seek an in-person evaluation if the symptoms worsen or if the condition fails to improve as anticipated.  [facility] [phone]  Other Instructions Genital Herpes Genital herpes is a common sexually transmitted infection (STI) that is caused by a virus. The virus spreads from person to person through contact with a sore, infected saliva, or infected skin. The virus can cause itching, blisters, and sores around the genitals or rectum. During an outbreak of infection, symptoms may last for several days and then go away. However, the virus remains in the body, so more outbreaks may happen in the future. The time between outbreaks varies and can be from months to years. Genital  herpes can affect anyone. It is particularly concerning for pregnant women because the virus can be passed to the baby during delivery. Genital herpes is also a concern for people who have a weak disease-fighting system (immune system). What are the causes? This condition is caused by the herpes simplex virus, type 1 or type 2 (HSV-1 or HSV-2). The virus may spread through: Sexual contact with an infected person, including vaginal, anal, and oral sex. Contact with a herpes sore. The skin. This means that you can get herpes from an infected partner even if  there are no blisters or sores present. Your partner may not know that he or she is infected. What increases the risk? You are more likely to develop this condition if: You have sex with many partners. You do not use latex or polyurethane condoms during sex. What are the signs or symptoms? Most people do not have symptoms or they have mild symptoms that may be mistaken for other skin problems. Symptoms may include: Small, red bumps near the genitals, rectum, or mouth. These bumps turn into blisters and then sores. Flu-like (influenza-like) symptoms, including: Fever. Body aches. Swollen lymph nodes. Headache. Painful urination. Pain and itching in the genital area or rectal area. Vaginal discharge. Tingling or shooting pain in the legs and buttocks. Generally, symptoms are more severe and last longer during the first (primary) outbreak. Influenza-like symptoms are also more common during the primary outbreak. How is this diagnosed? This condition may be diagnosed based on: A physical exam. Your medical history. Blood tests. A test of a fluid sample (culture) from an open sore. How is this treated? There is no cure for this condition, but treatment with antiviral medicines can do the following: Speed up healing and relieve symptoms. Help to reduce the spread of the virus to sexual partners. Limit the chance of future outbreaks, or make future outbreaks shorter. Lessen symptoms of future outbreaks. Your health care provider may also recommend over-the-counter medicines to help with pain and itching. Follow these instructions at home: If you have an outbreak:  Keep the affected areas dry and clean. Avoid rubbing or touching blisters and sores. If you do touch blisters or sores: Wash your hands thoroughly with soap and water for at least 20 seconds. If soap and water are not available, use an alcohol-based hand sanitizer. Do not touch your eyes afterward. Sexual activity Do not  have sexual contact during active outbreaks. Practice safe sex. Herpes can spread even if your partner does not have blisters or sores. Latex or polyurethane condoms and female condoms may help prevent the spread of the herpes virus. Managing pain and discomfort If directed, put ice on the painful area. To do this: Put ice in a plastic bag. Place a towel between your skin and the bag. Leave the ice on for 20 minutes, 2-3 times a day. Remove the ice if your skin turns bright red. This is very important. If you cannot feel pain, heat, or cold, you have a greater risk of damage to the area. If told, take a cool sitz bath to help relieve pain or itching. A sitz bath is a water bath that you take while sitting down in water that is deep enough to cover your hips and buttocks. General instructions Take over-the-counter and prescription medicines only as told by your health care provider. If you were prescribed an antiviral medicine, use it as told by your health care provider. Do not stop using the antiviral even if you  start to feel better. Keep all follow-up visits. This is important. How is this prevented? Use condoms. Although you can get genital herpes during sexual contact even with the use of a condom, a condom can provide some protection. Avoid having multiple sexual partners. Talk with your sexual partner about any symptoms either of you may have. Also, talk with your partner about any history of STIs. Do not have sexual contact if you have active symptoms of genital herpes. Contact a health care provider if: Your symptoms are not improving with medicine. Your symptoms return, or you have new symptoms. You have a fever. You have abdominal pain. You have redness, swelling, or pain in your eye. You notice new sores on other parts of your body. You have had herpes and you become pregnant or plan to become pregnant. Get help right away if: You have symptoms of viral meningitis. This is rare  but may happen if the virus spreads to the brain. Symptoms may include: Severe headache or stiff neck. Muscle aches. Nausea and vomiting. Sensitivity to light. Summary Genital herpes is a common sexually transmitted infection (STI) that is caused by the herpes simplex virus, type 1 or type 2 (HSV-1 or HSV-2). These viruses are most often spread through sexual contact with an infected person. You are more likely to develop this condition if you have sex with many partners or you do not use condoms during sex. Most people do not have symptoms or have mild symptoms that may be mistaken for other skin problems. Symptoms occur as outbreaks that may happen months or years apart. There is no cure for this condition, but treatment with oral antiviral medicines can reduce symptoms, reduce the chance of spreading the virus to a partner, prevent future outbreaks, or shorten future outbreaks. This information is not intended to replace advice given to you by your health care provider. Make sure you discuss any questions you have with your health care provider. Document Revised: 06/20/2021 Document Reviewed: 06/20/2021 Elsevier Patient Education  2024 Elsevier Inc.   If you have been instructed to have an in-person evaluation today at a local Urgent Care facility, please use the link below. It will take you to a list of all of our available [facility], including address, phone number and hours of operation. Please do not delay care.  [facility]  If you or a family member do not have a primary care provider, use the link below to schedule a visit and establish care. When you choose a [facility] primary care physician or advanced practice provider, you gain a long-term partner in health. Find a Primary Care Provider  Learn more about [facility]'s in-office and virtual care options: [facility] - Get Care Now

## 2023-12-27 ENCOUNTER — Emergency Department (HOSPITAL_BASED_OUTPATIENT_CLINIC_OR_DEPARTMENT_OTHER)

## 2023-12-27 ENCOUNTER — Encounter (HOSPITAL_BASED_OUTPATIENT_CLINIC_OR_DEPARTMENT_OTHER): Payer: Self-pay

## 2023-12-27 ENCOUNTER — Other Ambulatory Visit: Payer: Self-pay

## 2023-12-27 ENCOUNTER — Inpatient Hospital Stay (HOSPITAL_BASED_OUTPATIENT_CLINIC_OR_DEPARTMENT_OTHER)
Admission: EM | Admit: 2023-12-27 | Discharge: 2024-01-04 | DRG: 757 | Disposition: A | Discharge status: Home / Self Care | Attending: Obstetrics and Gynecology | Admitting: Obstetrics and Gynecology

## 2023-12-27 DIAGNOSIS — F419 Anxiety disorder, unspecified: Secondary | ICD-10-CM | POA: Diagnosis present

## 2023-12-27 DIAGNOSIS — R1030 Lower abdominal pain, unspecified: Principal | ICD-10-CM

## 2023-12-27 DIAGNOSIS — N7093 Salpingitis and oophoritis, unspecified: Secondary | ICD-10-CM | POA: Diagnosis not present

## 2023-12-27 DIAGNOSIS — Z8619 Personal history of other infectious and parasitic diseases: Secondary | ICD-10-CM | POA: Diagnosis present

## 2023-12-27 DIAGNOSIS — R9389 Abnormal findings on diagnostic imaging of other specified body structures: Secondary | ICD-10-CM | POA: Diagnosis present

## 2023-12-27 DIAGNOSIS — F17290 Nicotine dependence, other tobacco product, uncomplicated: Secondary | ICD-10-CM | POA: Diagnosis present

## 2023-12-27 DIAGNOSIS — Z79899 Other long term (current) drug therapy: Secondary | ICD-10-CM

## 2023-12-27 DIAGNOSIS — R768 Other specified abnormal immunological findings in serum: Secondary | ICD-10-CM | POA: Diagnosis present

## 2023-12-27 DIAGNOSIS — D72819 Decreased white blood cell count, unspecified: Secondary | ICD-10-CM | POA: Diagnosis present

## 2023-12-27 DIAGNOSIS — J189 Pneumonia, unspecified organism: Secondary | ICD-10-CM | POA: Diagnosis present

## 2023-12-27 DIAGNOSIS — J45909 Unspecified asthma, uncomplicated: Secondary | ICD-10-CM | POA: Diagnosis present

## 2023-12-27 DIAGNOSIS — K5900 Constipation, unspecified: Secondary | ICD-10-CM | POA: Diagnosis present

## 2023-12-27 DIAGNOSIS — F32A Depression, unspecified: Secondary | ICD-10-CM | POA: Diagnosis present

## 2023-12-27 DIAGNOSIS — Z820 Family history of epilepsy and other diseases of the nervous system: Secondary | ICD-10-CM

## 2023-12-27 DIAGNOSIS — A530 Latent syphilis, unspecified as early or late: Secondary | ICD-10-CM | POA: Diagnosis present

## 2023-12-27 DIAGNOSIS — Z1152 Encounter for screening for COVID-19: Secondary | ICD-10-CM

## 2023-12-27 HISTORY — DX: Gonococcal infection, unspecified: A54.9

## 2023-12-27 HISTORY — DX: Chlamydial infection, unspecified: A74.9

## 2023-12-27 LAB — COMPREHENSIVE METABOLIC PANEL WITH GFR
ALT: 10 U/L (ref 0–44)
AST: 13 U/L — ABNORMAL LOW (ref 15–41)
Albumin: 3.8 g/dL (ref 3.5–5.0)
Alkaline Phosphatase: 41 U/L (ref 38–126)
Anion gap: 7 (ref 5–15)
BUN: 10 mg/dL (ref 6–20)
CO2: 24 mmol/L (ref 22–32)
Calcium: 9.1 mg/dL (ref 8.9–10.3)
Chloride: 104 mmol/L (ref 98–111)
Creatinine, Ser: 0.67 mg/dL (ref 0.44–1.00)
GFR, Estimated: >60 mL/min (ref >60)
Glucose, Bld: 77 mg/dL (ref 70–99)
Potassium: 3.6 mmol/L (ref 3.5–5.1)
Sodium: 135 mmol/L (ref 135–145)
Total Bilirubin: 0.2 mg/dL (ref 0.0–1.2)
Total Protein: 7.3 g/dL (ref 6.5–8.1)

## 2023-12-27 LAB — URINALYSIS, ROUTINE W REFLEX MICROSCOPIC
Bilirubin Urine: NEGATIVE
Glucose, UA: NEGATIVE mg/dL
Hgb urine dipstick: NEGATIVE
Ketones, ur: NEGATIVE mg/dL
Leukocytes,Ua: NEGATIVE
Nitrite: NEGATIVE
Protein, ur: NEGATIVE mg/dL
Specific Gravity, Urine: 1.024 (ref 1.005–1.030)
pH: 6.5 (ref 5.0–8.0)

## 2023-12-27 LAB — CBC
HCT: 33.3 % — ABNORMAL LOW (ref 36.0–46.0)
Hemoglobin: 10.5 g/dL — ABNORMAL LOW (ref 12.0–15.0)
MCH: 22.7 pg — ABNORMAL LOW (ref 26.0–34.0)
MCHC: 31.5 g/dL (ref 30.0–36.0)
MCV: 72.1 fL — ABNORMAL LOW (ref 80.0–100.0)
Platelets: 180 10*3/uL (ref 150–400)
RBC: 4.62 MIL/uL (ref 3.87–5.11)
RDW: 13.2 % (ref 11.5–15.5)
WBC: 5.2 10*3/uL (ref 4.0–10.5)
nRBC: 0 % (ref 0.0–0.2)

## 2023-12-27 LAB — PREGNANCY, URINE: Preg Test, Ur: NEGATIVE

## 2023-12-27 LAB — LIPASE, BLOOD: Lipase: 42 U/L (ref 11–51)

## 2023-12-27 MED ORDER — IOHEXOL 300 MG/ML  SOLN
100.0000 mL | Freq: Once | INTRAMUSCULAR | Status: AC | PRN
  Administered 2023-12-27: 100 mL via INTRAVENOUS

## 2023-12-27 NOTE — ED Provider Notes (Signed)
 [place] EMERGENCY DEPARTMENT AT [facility] Provider Note   CSN: [id number] Arrival date & time: 12/27/23  1941     History  Chief Complaint  Patient presents with   Constipation    Laura [name] is a 23 y.o. female.   Constipation Associated symptoms: abdominal pain   Patient is a 23 year old female presents the ED today with complaints of lower abdominal pain with reported constipation.  Laura [name] states that Laura [name] has had difficulty with bowel movements for many months and has been taking Dulcolax and MiraLAX without help.  Noted that last bowel movement was yesterday where Laura [name] had hard small stools.  Last menstrual period was 2 weeks ago  Denies fever, chest pain, shortness of breath, nausea, vomiting, diarrhea, hematochezia, melena, dysuria, vaginal discharge, vaginal bleeding, lower leg swelling.    Home Medications Prior to Admission medications   Medication Sig Start Date End Date Taking? Authorizing Provider  acetaminophen (TYLENOL) 500 MG tablet Take 1 tablet (500 mg total) by mouth every 6 (six) hours as needed. 06/07/23   Small, Brooke L, PA  albuterol (PROVENTIL) (2.5 MG/3ML) 0.083% nebulizer solution Take 3 mLs (2.5 mg total) by nebulization every 6 (six) hours as needed for wheezing or shortness of breath. 01/27/22   Roemhildt, Lorin T, PA-C  albuterol (VENTOLIN HFA) 108 (90 Base) MCG/ACT inhaler Inhale 1-2 puffs into the lungs every 6 (six) hours as needed for wheezing or shortness of breath.    [provider]  busPIRone (BUSPAR) 15 MG tablet Take by mouth. 07/13/23   [provider]  escitalopram (LEXAPRO) 10 MG tablet Take 5 mg by mouth at bedtime. 06/17/22   [provider]  ibuprofen (ADVIL) 800 MG tablet Take 1 tablet (800 mg total) by mouth every 8 (eight) hours as needed (pain). 09/27/23   Zenia Resides, MD  levonorgestrel (MIRENA, 52 MG,) 20 MCG/DAY IUD to be inserted by provider in office at postpartum visit ICD 10 Z30.42  10/22/21   [provider]  metoCLOPramide (REGLAN) 10 MG tablet Take 1 tablet (10 mg total) by mouth every 6 (six) hours. 08/27/23   Rondel Baton, MD  ondansetron (ZOFRAN) 4 MG tablet Take 1 tablet (4 mg total) by mouth every 6 (six) hours. 08/16/23   Rising, Lurena Joiner, PA-C  tiZANidine (ZANAFLEX) 4 MG tablet Take 1 tablet (4 mg total) by mouth every 8 (eight) hours as needed for muscle spasms. 09/27/23   Zenia Resides, MD      Allergies    Patient has no known allergies.    Review of Systems   Review of Systems  Gastrointestinal:  Positive for abdominal pain and constipation.  All other systems reviewed and are negative.   Physical Exam Updated Vital Signs BP (!) 124/90 (BP Location: Right Arm)   Pulse 95   Temp 98 F (36.7 C)   Resp 18   SpO2 100%  Physical Exam Vitals and nursing note reviewed. Exam conducted with a chaperone present.  Constitutional:      General: Laura [name] is not in acute distress.    Appearance: Normal appearance. Laura [name] is not ill-appearing.  HENT:     Head: Normocephalic and atraumatic.  Eyes:     General: No scleral icterus.       Right eye: No discharge.        Left eye: No discharge.     Extraocular Movements: Extraocular movements intact.     Conjunctiva/sclera: Conjunctivae normal.  Cardiovascular:     Rate and  Rhythm: Normal rate and regular rhythm.     Pulses: Normal pulses.     Heart sounds: Normal heart sounds. No murmur heard.    No friction rub. No gallop.  Pulmonary:     Effort: Pulmonary effort is normal. No respiratory distress.     Breath sounds: Normal breath sounds. No stridor. No wheezing, rhonchi or rales.  Abdominal:     General: Abdomen is flat. Bowel sounds are normal. There is no distension.     Palpations: Abdomen is soft.     Tenderness: There is abdominal tenderness. There is no right CVA tenderness, left CVA tenderness or guarding.  Genitourinary:    General: Normal vulva.     Exam position: Lithotomy  position.     Pubic Area: No rash or pubic lice.      Labia:        Right: No rash, tenderness, lesion or injury.        Left: No rash, tenderness, lesion or injury.      Vagina: Normal. No signs of injury and foreign body. No erythema, bleeding or prolapsed vaginal walls.     Cervix: Normal. No cervical motion tenderness, discharge, friability, lesion, erythema or cervical bleeding.     Uterus: Normal.      Adnexa:        Right: Tenderness present.        Left: Tenderness present.   Musculoskeletal:     Right lower leg: No edema.     Left lower leg: No edema.  Skin:    General: Skin is warm and dry.     Findings: No bruising, erythema, lesion or rash.  Neurological:     General: No focal deficit present.     Mental Status: Laura [name] is alert and oriented to person, place, and time. Mental status is at baseline.     Sensory: No sensory deficit.     Motor: No weakness.     Coordination: Coordination normal.     Gait: Gait normal.  Psychiatric:        Mood and Affect: Mood normal.     ED Results / Procedures / Treatments   Labs (all labs ordered are listed, but only abnormal results are displayed) Labs Reviewed  COMPREHENSIVE METABOLIC PANEL WITH GFR - Abnormal; Notable for the following components:      Result Value   AST 13 (*)    All other components within normal limits  CBC - Abnormal; Notable for the following components:   Hemoglobin 10.5 (*)    HCT 33.3 (*)    MCV 72.1 (*)    MCH 22.7 (*)    All other components within normal limits  WET PREP, GENITAL  LIPASE, BLOOD  URINALYSIS, ROUTINE W REFLEX MICROSCOPIC  PREGNANCY, URINE  RPR  RAPID HIV SCREEN (HIV 1/2 AB+AG)  HEPATITIS B SURFACE ANTIGEN  GC/CHLAMYDIA PROBE AMP ([place]) NOT AT [facility]    EKG None  Radiology CT ABDOMEN PELVIS W CONTRAST Result Date: 12/27/2023 CLINICAL DATA:  Left lower quadrant abdominal pain and right lower quadrant abdominal pain EXAM: CT ABDOMEN AND PELVIS WITH CONTRAST TECHNIQUE:  Multidetector CT imaging of the abdomen and pelvis was performed using the standard protocol following bolus administration of intravenous contrast. RADIATION DOSE REDUCTION: This exam was performed according to the departmental dose-optimization program which includes automated exposure control, adjustment of the mA and/or kV according to patient size and/or use of iterative reconstruction technique. CONTRAST:  OMNIPAQUE IOHEXOL 300 MG/ML  SOLN  COMPARISON:  CT abdomen pelvis 06/13/2019 FINDINGS: Lower chest: Airspace opacities in the anterior left lower lobe suspicious for pneumonia. Hepatobiliary: Unremarkable liver. Normal gallbladder. No biliary dilation. Pancreas: Unremarkable. Spleen: Unremarkable. Adrenals/Urinary Tract: Normal adrenal glands. No urinary calculi or hydronephrosis. Bladder is unremarkable. Stomach/Bowel: Normal caliber large and small bowel. No bowel wall thickening. The appendix is normal.Stomach is within normal limits. Vascular/Lymphatic: No significant vascular findings are present. No enlarged abdominal or pelvic lymph nodes. Reproductive: Dilated tubular structure in the left pelvis measuring 6.5 x 2.9 cm suspicious for dilated fallopian tube. Adjacent free fluid in the pelvis. Uterus and right adnexa are unremarkable. Other: No free intraperitoneal fluid or air. Musculoskeletal: No acute fracture. IMPRESSION: 1. Dilated tubular structure in the left pelvis measuring 6.5 x 2.9 cm suspicious for dilated Fallopian tube. Adjacent free fluid in the pelvis. Findings are suspicious for left-sided salpingitis/pyosalpinx. Recommend further evaluation with pelvic ultrasound. 2. Airspace opacities in the anterior left lower lobe suspicious for pneumonia. Electronically Signed   By: Minerva Fester M.D.   On: 12/27/2023 23:17    Procedures Procedures    Medications Ordered in ED Medications  cefOXitin (MEFOXIN) 2 g in sodium chloride 0.9 % 100 mL IVPB (has no administration in time  range)  doxycycline (VIBRA-TABS) tablet 100 mg (has no administration in time range)  metroNIDAZOLE (FLAGYL) tablet 500 mg (has no administration in time range)  iohexol (OMNIPAQUE) 300 MG/ML solution 100 mL (100 mLs Intravenous Contrast Given 12/27/23 2300)    ED Course/ Medical Decision Making/ A&P                                 Medical Decision Making Amount and/or Complexity of Data Reviewed Labs: ordered.   Patient is a 23 year old female presents the ED today with complaints of lower abdominal pain with reported constipation.  Laura [name] states that Laura [name] has had difficulty with bowel movements for many months and has been taking Dulcolax and MiraLAX without help.  Noted that last bowel movement was yesterday where Laura [name] had hard small stools.  States that Laura [name] is not unable to pass gas.  On physical exam, patient is noted to have lower abdominal tenderness in right upper quadrant and left and right lower quadrants.  Laura [name] is in no acute distress, nontachycardic, nontachypneic, afebrile.  Patient is noted to have bowel sounds in all 4 quadrants, LCTAB.  Unremarkable physical exam otherwise.  On pelvic exam, no cervical motion tenderness is noted on exam, however when speaking to palpate to the ovaries bilaterally, patient was noted to be in immense pain.  Cervix also appeared normal and was unremarkable exam otherwise.  Labs were unremarkable, CBC noting a hemoglobin 10.5 which seems to be baseline for patient.  Given patient's abdominal pain, CT was ordered and shown to suspected possible tubo-ovarian abscess 6.5 x 2.9 cm on the left side. Dr. Vergie Living with OB/GYN was consulted that time who suggested that Laura [name] be started on Flagyl  500 mg every 12 hours, doxycycline p.o. x 12 hours, cefoxitin 2 g every 6 hours and be transferred to,.  He states that he is going to put in the orders and asked for 6 N. he also requested adding RPR, hep B surface antigen, HIV.  These were provided and labs were  drawn.   Patient care then transferred over to Dr. Vergie Living at that time.  With patient awaiting transfer to [facility]  Differential diagnoses prior to  evaluation: The emergent differential diagnosis includes, but is not limited to, stool burden, SBO, LBO, diverticulitis, appendicitis, ovarian torsion, ovarian cyst/abscess, endometriosis, enteritis, mesenteric ischemia. This is not an exhaustive differential.   Past Medical History / Co-morbidities / Social History: Asthma, anemia, anxiety, genital herpes  Additional history: Chart reviewed. Pertinent results include: Noted to have 8 ED visits in the last 6 months  Lab Tests/Imaging studies: I personally interpreted labs/imaging and the pertinent results include:  CBC notes a anemia with hemoglobin 10.5 which is near baseline for patient CMP unremarkable UA unremarkable Urine pregnancy test negative Lipase unremarkable CT scan shows dilated tubular structure in the left pelvis noted to be 6.5 x 2.9 cm suspicion for dilated fallopian tube with adjacent free fluid in the pelvis possibly salpingitis versus pyosalpinx. RPR pending, hep B antigen pending, HIV pending, GC pending, workup pending. I agree with the radiologist interpretation.  Medications: I ordered medication including cefoxitin, Flagyl, doxycycline.  I have reviewed the patients home medicines and have made adjustments as needed.  Critical Interventions:  Social Determinants of Health:  Disposition: After consideration of the diagnostic results and the patients response to treatment, I feel that the patient would benefit from admission at [facility] with Dr. Vergie Living, OB/GYN, as the admitting doctor.   Final Clinical Impression(s) / ED Diagnoses Final diagnoses:  Lower abdominal pain    Rx / DC Orders ED Discharge Orders     None         Lunette Stands, PA-C 12/28/23 0014    Rolan Bucco, MD 12/30/23 [phone]

## 2023-12-27 NOTE — ED Provider Notes (Incomplete)
 [place] EMERGENCY DEPARTMENT AT [facility] Provider Note   CSN: [id number] Arrival date & time: 12/27/23  1941     History {Add pertinent medical, surgical, social history, OB history to HPI:1} Chief Complaint  Patient presents with  . Constipation    Taleigha Pinson is a 23 y.o. female.   Constipation Associated symptoms: abdominal pain   Patient is a 23 year old female presents the ED today with complaints of lower abdominal pain with reported constipation.  She states that she has had difficulty with bowel movements for many months and has been taking Dulcolax and MiraLAX without help.  Noted that last bowel movement was yesterday where she had hard small stools.  Last menstrual period was 2 weeks ago  Denies fever, chest pain, shortness of breath, nausea, vomiting, diarrhea, hematochezia, melena, dysuria, vaginal discharge, vaginal bleeding, lower leg swelling.    Home Medications Prior to Admission medications   Medication Sig Start Date End Date Taking? Authorizing Provider  acetaminophen (TYLENOL) 500 MG tablet Take 1 tablet (500 mg total) by mouth every 6 (six) hours as needed. 06/07/23   Small, Brooke L, PA  albuterol (PROVENTIL) (2.5 MG/3ML) 0.083% nebulizer solution Take 3 mLs (2.5 mg total) by nebulization every 6 (six) hours as needed for wheezing or shortness of breath. 01/27/22   Roemhildt, Lorin T, PA-C  albuterol (VENTOLIN HFA) 108 (90 Base) MCG/ACT inhaler Inhale 1-2 puffs into the lungs every 6 (six) hours as needed for wheezing or shortness of breath.    [provider]  busPIRone (BUSPAR) 15 MG tablet Take by mouth. 07/13/23   [provider]  escitalopram (LEXAPRO) 10 MG tablet Take 5 mg by mouth at bedtime. 06/17/22   [provider]  ibuprofen (ADVIL) 800 MG tablet Take 1 tablet (800 mg total) by mouth every 8 (eight) hours as needed (pain). 09/27/23   Zenia Resides, MD  levonorgestrel (MIRENA, 52 MG,) 20 MCG/DAY IUD to  be inserted by provider in office at postpartum visit ICD 10 Z30.42 10/22/21   [provider]  metoCLOPramide (REGLAN) 10 MG tablet Take 1 tablet (10 mg total) by mouth every 6 (six) hours. 08/27/23   Rondel Baton, MD  ondansetron (ZOFRAN) 4 MG tablet Take 1 tablet (4 mg total) by mouth every 6 (six) hours. 08/16/23   Rising, Lurena Joiner, PA-C  tiZANidine (ZANAFLEX) 4 MG tablet Take 1 tablet (4 mg total) by mouth every 8 (eight) hours as needed for muscle spasms. 09/27/23   Zenia Resides, MD      Allergies    Patient has no known allergies.    Review of Systems   Review of Systems  Gastrointestinal:  Positive for abdominal pain and constipation.  All other systems reviewed and are negative.   Physical Exam Updated Vital Signs BP (!) 124/90 (BP Location: Right Arm)   Pulse 95   Temp 98 F (36.7 C)   Resp 18   SpO2 100%  Physical Exam Vitals and nursing note reviewed.  Constitutional:      General: She is not in acute distress.    Appearance: Normal appearance. She is not ill-appearing.  HENT:     Head: Normocephalic and atraumatic.  Eyes:     General: No scleral icterus.       Right eye: No discharge.        Left eye: No discharge.     Extraocular Movements: Extraocular movements intact.     Conjunctiva/sclera: Conjunctivae normal.  Cardiovascular:  Rate and Rhythm: Normal rate and regular rhythm.     Pulses: Normal pulses.     Heart sounds: Normal heart sounds. No murmur heard.    No friction rub. No gallop.  Pulmonary:     Effort: Pulmonary effort is normal. No respiratory distress.     Breath sounds: Normal breath sounds. No stridor. No wheezing, rhonchi or rales.  Abdominal:     General: Abdomen is flat. Bowel sounds are normal. There is no distension.     Palpations: Abdomen is soft.     Tenderness: There is abdominal tenderness. There is no right CVA tenderness, left CVA tenderness or guarding.  Musculoskeletal:     Right lower leg: No edema.      Left lower leg: No edema.  Skin:    General: Skin is warm and dry.     Findings: No bruising, erythema, lesion or rash.  Neurological:     General: No focal deficit present.     Mental Status: She is alert and oriented to person, place, and time. Mental status is at baseline.     Sensory: No sensory deficit.     Motor: No weakness.     Coordination: Coordination normal.     Gait: Gait normal.  Psychiatric:        Mood and Affect: Mood normal.     ED Results / Procedures / Treatments   Labs (all labs ordered are listed, but only abnormal results are displayed) Labs Reviewed  COMPREHENSIVE METABOLIC PANEL WITH GFR - Abnormal; Notable for the following components:      Result Value   AST 13 (*)    All other components within normal limits  CBC - Abnormal; Notable for the following components:   Hemoglobin 10.5 (*)    HCT 33.3 (*)    MCV 72.1 (*)    MCH 22.7 (*)    All other components within normal limits  LIPASE, BLOOD  URINALYSIS, ROUTINE W REFLEX MICROSCOPIC  PREGNANCY, URINE    EKG None  Radiology No results found.  Procedures Procedures    Medications Ordered in ED Medications - No data to display  ED Course/ Medical Decision Making/ A&P                                 Medical Decision Making Amount and/or Complexity of Data Reviewed Labs: ordered.   Patient is a 23 year old female presents the ED today with complaints of lower abdominal pain with reported constipation.  She states that she has had difficulty with bowel movements for many months and has been taking Dulcolax and MiraLAX without help.  Noted that last bowel movement was yesterday where she had hard small stools.  States that she is not unable to pass gas.  On physical exam, patient is noted to have lower abdominal tenderness in right upper quadrant and left and right lower quadrants.  She is in no acute distress, nontachycardic, nontachypneic, afebrile.  Patient is noted to have bowel  sounds in all 4 quadrants, LCTAB.  Unremarkable physical exam otherwise.  Patient deferred pelvic exam.  Labs were unremarkable, CBC noting a hemoglobin 10.5 which seems to be baseline for patient.  Given patient's abdominal pain, CT was ordered and shown to   Differential diagnoses prior to evaluation: The emergent differential diagnosis includes, but is not limited to, stool burden, SBO, LBO, diverticulitis, appendicitis, ovarian torsion, ovarian cyst/abscess, endometriosis, enteritis, mesenteric ischemia. This  is not an exhaustive differential.   Past Medical History / Co-morbidities / Social History: Asthma, anemia, anxiety, genital herpes  Additional history: Chart reviewed. Pertinent results include: Noted to have 8 ED visits in the last 6 months  Lab Tests/Imaging studies: I personally interpreted labs/imaging and the pertinent results include:  CBC notes a anemia with hemoglobin 10.5 which is near baseline for patient CMP unremarkable UA unremarkable Urine pregnancy test negative Lipase unremarkable CT scan shows dilated tubular structure in the left pelvis noted to be 6.5 x 2.9 cm suspicion for dilated fallopian tube with adjacent free fluid in the pelvis possibly salpingitis versus pyosalpinx.   ***I agree with the radiologist interpretation.  Cardiac monitoring: EKG obtained and interpreted by myself and attending physician which shows: ***   Medications: I ordered medication including ***.  I have reviewed the patients home medicines and have made adjustments as needed.  Critical Interventions:  Social Determinants of Health:  Disposition: After consideration of the diagnostic results and the patients response to treatment, I feel that the patient would benefit from ***.   ***emergency department workup does not suggest an emergent condition requiring admission or immediate intervention beyond what has been performed at this time. The plan is: ***. The patient is safe  for discharge and has been instructed to return immediately for worsening symptoms, change in symptoms or any other concerns.   {Document critical care time when appropriate:1} {Document review of labs and clinical decision tools ie heart score, Chads2Vasc2 etc:1}  {Document your independent review of radiology images, and any outside records:1} {Document your discussion with family members, caretakers, and with consultants:1} {Document social determinants of health affecting pt's care:1} {Document your decision making why or why not admission, treatments were needed:1} Final Clinical Impression(s) / ED Diagnoses Final diagnoses:  None    Rx / DC Orders ED Discharge Orders     None

## 2023-12-27 NOTE — ED Triage Notes (Signed)
 Pt c/o "severe constipation" x3 days, abd pain "w a pulse" in rectum. Small, hard bowel movements recently. Advises she used laxative & enema w no relief.   Pt advises it's been "weeks" since last BM, unable to pass gas.

## 2023-12-28 ENCOUNTER — Inpatient Hospital Stay (HOSPITAL_COMMUNITY)

## 2023-12-28 ENCOUNTER — Other Ambulatory Visit: Payer: Self-pay | Admitting: Obstetrics and Gynecology

## 2023-12-28 ENCOUNTER — Encounter (HOSPITAL_COMMUNITY): Payer: Self-pay | Admitting: Obstetrics and Gynecology

## 2023-12-28 DIAGNOSIS — B9689 Other specified bacterial agents as the cause of diseases classified elsewhere: Secondary | ICD-10-CM | POA: Diagnosis not present

## 2023-12-28 DIAGNOSIS — Z79899 Other long term (current) drug therapy: Secondary | ICD-10-CM | POA: Diagnosis not present

## 2023-12-28 DIAGNOSIS — N7093 Salpingitis and oophoritis, unspecified: Secondary | ICD-10-CM | POA: Diagnosis present

## 2023-12-28 DIAGNOSIS — R9389 Abnormal findings on diagnostic imaging of other specified body structures: Secondary | ICD-10-CM | POA: Diagnosis not present

## 2023-12-28 DIAGNOSIS — D72819 Decreased white blood cell count, unspecified: Secondary | ICD-10-CM | POA: Diagnosis present

## 2023-12-28 DIAGNOSIS — F419 Anxiety disorder, unspecified: Secondary | ICD-10-CM | POA: Diagnosis present

## 2023-12-28 DIAGNOSIS — Z1152 Encounter for screening for COVID-19: Secondary | ICD-10-CM | POA: Diagnosis not present

## 2023-12-28 DIAGNOSIS — Z820 Family history of epilepsy and other diseases of the nervous system: Secondary | ICD-10-CM | POA: Diagnosis not present

## 2023-12-28 DIAGNOSIS — J189 Pneumonia, unspecified organism: Secondary | ICD-10-CM | POA: Diagnosis present

## 2023-12-28 DIAGNOSIS — J45909 Unspecified asthma, uncomplicated: Secondary | ICD-10-CM | POA: Diagnosis present

## 2023-12-28 DIAGNOSIS — K5900 Constipation, unspecified: Secondary | ICD-10-CM | POA: Diagnosis present

## 2023-12-28 DIAGNOSIS — F17290 Nicotine dependence, other tobacco product, uncomplicated: Secondary | ICD-10-CM | POA: Diagnosis present

## 2023-12-28 DIAGNOSIS — Z8619 Personal history of other infectious and parasitic diseases: Secondary | ICD-10-CM | POA: Diagnosis not present

## 2023-12-28 DIAGNOSIS — F32A Depression, unspecified: Secondary | ICD-10-CM | POA: Diagnosis present

## 2023-12-28 HISTORY — DX: Salpingitis and oophoritis, unspecified: N70.93

## 2023-12-28 LAB — WET PREP, GENITAL
Clue Cells Wet Prep HPF POC: NONE SEEN
Sperm: NONE SEEN
Trich, Wet Prep: NONE SEEN
WBC, Wet Prep HPF POC: <10 (ref <10)
Yeast Wet Prep HPF POC: NONE SEEN

## 2023-12-28 LAB — RESP PANEL BY RT-PCR (RSV, FLU A&B, COVID)  RVPGX2
Influenza A by PCR: NEGATIVE
Influenza B by PCR: NEGATIVE
Resp Syncytial Virus by PCR: NEGATIVE
SARS Coronavirus 2 by RT PCR: NEGATIVE

## 2023-12-28 LAB — RAPID HIV SCREEN (HIV 1/2 AB+AG)
HIV 1/2 Antibodies: NONREACTIVE
HIV-1 P24 Antigen - HIV24: NONREACTIVE

## 2023-12-28 LAB — HEPATITIS B SURFACE ANTIGEN: Hepatitis B Surface Ag: NONREACTIVE

## 2023-12-28 LAB — CREATININE, SERUM
Creatinine, Ser: 0.78 mg/dL (ref 0.44–1.00)
GFR, Estimated: >60 mL/min (ref >60)

## 2023-12-28 MED ORDER — DOXYCYCLINE HYCLATE 100 MG PO TABS
100.0000 mg | ORAL_TABLET | Freq: Two times a day (BID) | ORAL | Status: DC
  Administered 2023-12-28 – 2023-12-31 (×7): 100 mg via ORAL
  Filled 2023-12-28 (×7): qty 1

## 2023-12-28 MED ORDER — SODIUM CHLORIDE 0.9 % IV SOLN
2.0000 g | Freq: Four times a day (QID) | INTRAVENOUS | Status: DC
  Administered 2023-12-28: 2 g via INTRAVENOUS
  Filled 2023-12-28 (×2): qty 2

## 2023-12-28 MED ORDER — ONDANSETRON HCL 4 MG PO TABS
4.0000 mg | ORAL_TABLET | Freq: Four times a day (QID) | ORAL | Status: DC | PRN
Start: 2023-12-28 — End: 2024-01-04
  Administered 2023-12-28 – 2024-01-01 (×2): 4 mg via ORAL
  Filled 2023-12-28 (×2): qty 1

## 2023-12-28 MED ORDER — POLYETHYLENE GLYCOL 3350 17 G PO PACK
17.0000 g | PACK | Freq: Every day | ORAL | Status: DC
  Administered 2023-12-28 – 2024-01-03 (×6): 17 g via ORAL
  Filled 2023-12-28 (×8): qty 1

## 2023-12-28 MED ORDER — ENOXAPARIN SODIUM 40 MG/0.4ML IJ SOSY
40.0000 mg | PREFILLED_SYRINGE | INTRAMUSCULAR | Status: DC
  Filled 2023-12-28 (×3): qty 0.4

## 2023-12-28 MED ORDER — ONDANSETRON HCL 4 MG/2ML IJ SOLN
4.0000 mg | Freq: Four times a day (QID) | INTRAMUSCULAR | Status: DC | PRN
  Administered 2023-12-29 – 2024-01-01 (×6): 4 mg via INTRAVENOUS
  Filled 2023-12-28 (×8): qty 2

## 2023-12-28 MED ORDER — DOXYCYCLINE HYCLATE 100 MG PO TABS
100.0000 mg | ORAL_TABLET | Freq: Two times a day (BID) | ORAL | Status: DC
  Administered 2023-12-28: 100 mg via ORAL
  Filled 2023-12-28: qty 1

## 2023-12-28 MED ORDER — SODIUM CHLORIDE 0.9% FLUSH
3.0000 mL | Freq: Two times a day (BID) | INTRAVENOUS | Status: DC
  Administered 2023-12-28 – 2024-01-03 (×14): 3 mL via INTRAVENOUS

## 2023-12-28 MED ORDER — SODIUM CHLORIDE 0.9 % IV SOLN
2.0000 g | Freq: Four times a day (QID) | INTRAVENOUS | Status: DC
  Administered 2023-12-28 – 2023-12-29 (×6): 2 g via INTRAVENOUS
  Filled 2023-12-28 (×10): qty 2

## 2023-12-28 MED ORDER — SODIUM CHLORIDE 0.9% FLUSH
3.0000 mL | INTRAVENOUS | Status: DC | PRN
Start: 2023-12-28 — End: 2024-01-04
  Administered 2023-12-31 (×2): 3 mL via INTRAVENOUS

## 2023-12-28 MED ORDER — SODIUM CHLORIDE 0.9 % IV SOLN: 250.0000 mL | INTRAVENOUS | Status: AC | PRN

## 2023-12-28 MED ORDER — METRONIDAZOLE 500 MG PO TABS
500.0000 mg | ORAL_TABLET | Freq: Two times a day (BID) | ORAL | Status: DC
  Administered 2023-12-28 – 2023-12-31 (×7): 500 mg via ORAL
  Filled 2023-12-28 (×7): qty 1

## 2023-12-28 MED ORDER — OXYCODONE-ACETAMINOPHEN 5-325 MG PO TABS
1.0000 | ORAL_TABLET | Freq: Four times a day (QID) | ORAL | Status: DC | PRN
  Administered 2023-12-28 (×3): 1 via ORAL
  Administered 2023-12-29 – 2024-01-01 (×11): 2 via ORAL
  Filled 2023-12-28 (×5): qty 2
  Filled 2023-12-28: qty 1
  Filled 2023-12-28 (×2): qty 2
  Filled 2023-12-28 (×2): qty 1
  Filled 2023-12-28 (×6): qty 2

## 2023-12-28 MED ORDER — METRONIDAZOLE 500 MG PO TABS
500.0000 mg | ORAL_TABLET | Freq: Two times a day (BID) | ORAL | Status: DC
  Administered 2023-12-28: 500 mg via ORAL
  Filled 2023-12-28: qty 1

## 2023-12-28 MED ORDER — IBUPROFEN 600 MG PO TABS
600.0000 mg | ORAL_TABLET | Freq: Four times a day (QID) | ORAL | Status: DC | PRN
  Administered 2024-01-01: 600 mg via ORAL
  Filled 2023-12-28 (×2): qty 1

## 2023-12-28 NOTE — H&P (Addendum)
 Obstetrics & Gynecology H&P   Date of Admission: 12/28/2023   Requesting Provider: [facility]  Primary OBGYN: [facility] Primary Care Provider: [facility]  Reason for Admission: 6-7cm left TOA  History of Present Illness: Laura [name] is a 23 y.o. G3P3003 (No LMP recorded. Patient has had an injection.), with the above CC.   Patient present to ED with several days worth of low belly pain that worsened. ED work up showed left sided toa on the left and pt started on abx.   ROS: A 12-point review of systems was performed and negative, except as stated in the above HPI.  OBGYN History: As per HPI. OB History  Gravida Para Term Preterm AB Living  3 3 3   3   SAB IAB Ectopic Multiple Live Births      3    # Outcome Date GA Lbr Len/2nd Weight Sex Type Anes PTL Lv  3 Term 2022     Vag-Spont   LIV  2 Term 2021     Vag-Spont   LIV  1 Term 01/30/18 [redacted]w[redacted]d   F Vag-Spont   LIV   Periods: irregular since being on depo provera History of STIs: in remote past Contraception: last depo provera 07/13/2023    Past Medical History: Past Medical History:  Diagnosis Date   Anemia    Anxiety    Asthma    Depression    Genital herpes    Ovarian cyst     Past Surgical History: Past Surgical History:  Procedure Laterality Date   OVARIAN CYST REMOVAL     Family History:  Family History  Problem Relation Age of Onset   Epilepsy Father    Social History:  Social History   Socioeconomic History   Marital status: Single    Spouse name: Not on file   Number of children: Not on file   Years of education: Not on file   Highest education level: Not on file  Occupational History   Not on file  Tobacco Use   Smoking status: Former    Current packs/day: 0.00    Types: Cigarettes, Cigars    Quit date: 10/07/2018    Years since quitting: 5.2   Smokeless tobacco: Never  Vaping Use   Vaping status: Some Days  Substance and Sexual Activity   Alcohol use: Yes    Drug use: No   Sexual activity: Yes    Birth control/protection: Injection  Other Topics Concern   Not on file  Social History Narrative   Not on file   Social Drivers of Health   Financial Resource Strain: Low Risk  (12/11/2022)   Received from [facility]   Overall Financial Resource Strain (CARDIA)    Difficulty of Paying Living Expenses: Not hard at all  Food Insecurity: No Food Insecurity (12/28/2023)   Hunger Vital Sign    Worried About Running Out of Food in the Last Year: Never true    Ran Out of Food in the Last Year: Never true  Transportation Needs: No Transportation Needs (12/28/2023)   PRAPARE - Administrator, Civil Service (Medical): No    Lack of Transportation (Non-Medical): No  Physical Activity: Unknown (12/11/2022)   Received from [facility]   Exercise Vital Sign    Days of Exercise per Week: 0 days    Minutes of Exercise per Session: Not on file  Recent Concern: Physical Activity - Inactive (12/11/2022)   [facility]   Exercise Vital Sign    Days of Exercise per Week: 0 days    Minutes of Exercise per Session: 0 min  Stress: Stress Concern Present (12/11/2022)   Received from [facility], [facility] - Occupational Stress Questionnaire    Feeling of Stress : Rather much  Social Connections: Socially Integrated (12/11/2022)   Received from [facility], [facility]   Social Network    How would you rate your social network (family, work, friends)?: Good participation with social networks  Intimate Partner Violence: Not At Risk (12/28/2023)   Humiliation, Afraid, Rape, and Kick questionnaire    Fear of Current or Ex-Partner: No    Emotionally Abused: No    Physically Abused: No    Sexually Abused: No   Allergy: No Known Allergies  Current Outpatient Medications: Medications Prior to Admission  Medication Sig Dispense Refill Last Dose/Taking    albuterol (PROVENTIL) (2.5 MG/3ML) 0.083% nebulizer solution Take 3 mLs (2.5 mg total) by nebulization every 6 (six) hours as needed for wheezing or shortness of breath. 75 mL 12 Unknown   albuterol (VENTOLIN HFA) 108 (90 Base) MCG/ACT inhaler Inhale 2 puffs into the lungs every 6 (six) hours as needed for wheezing or shortness of breath.   Unknown   bisacodyl (DULCOLAX) 5 MG EC tablet Take 5 mg by mouth daily as needed for moderate constipation.   Past Week   medroxyPROGESTERone (DEPO-PROVERA) 150 MG/ML injection Inject 150 mg into the muscle every 3 (three) months.   06/30/2023   polyethylene glycol (MIRALAX / GLYCOLAX) 17 g packet Take 17 g by mouth daily as needed for mild constipation, moderate constipation or severe constipation.   Past Week   busPIRone (BUSPAR) 5 MG tablet Take 5 mg by mouth daily. (Patient not taking: Reported on 12/28/2023)   Not Taking   escitalopram (LEXAPRO) 10 MG tablet Take 10 mg by mouth at bedtime. (Patient not taking: Reported on 12/28/2023)   Not Taking     Hospital Medications: Current Facility-Administered Medications  Medication Dose Route Frequency Provider Last Rate Last Admin   0.9 %  sodium chloride infusion  250 mL Intravenous PRN [place] Bing, MD       cefOXitin (MEFOXIN) 2 g in sodium chloride 0.9 % 100 mL IVPB  2 g Intravenous Q6H [place] Bing, MD 200 mL/hr at 12/28/23 0601 2 g at 12/28/23 0601   doxycycline (VIBRA-TABS) tablet 100 mg  100 mg Oral Q12H [place] Bing, MD       enoxaparin (LOVENOX) injection 40 mg  40 mg Subcutaneous Q24H [place] Bing, MD       ibuprofen (ADVIL) tablet 600 mg  600 mg Oral Q6H PRN [place] Bing, MD       metroNIDAZOLE (FLAGYL) tablet 500 mg  500 mg Oral Q12H [place] Bing, MD       ondansetron [facility]) tablet 4 mg  4 mg Oral Q6H PRN [place] Bing, MD   4 mg at 12/28/23 1610   Or   ondansetron (ZOFRAN) injection 4 mg  4 mg Intravenous Q6H PRN [place] Bing, MD       oxyCODONE-acetaminophen  (PERCOCET/ROXICET) 5-325 MG per tablet 1-2 tablet  1-2 tablet Oral Q6H PRN [place] Bing, MD   1 tablet at 12/28/23 0311   polyethylene glycol (MIRALAX / GLYCOLAX) packet 17 g  17 g Oral Daily [place] Bing, MD       sodium chloride flush (NS) 0.9 % injection 3  mL  3 mL Intravenous Q12H [place] Bing, MD   3 mL at 12/28/23 0245   sodium chloride flush (NS) 0.9 % injection 3 mL  3 mL Intravenous PRN [place] Bing, MD         Physical Exam:  Current Vital Signs 24h Vital Sign Ranges  T 98.3 F (36.8 C) Temp  Avg: 98.3 F (36.8 C)  Min: 98 F (36.7 C)  Max: 98.7 F (37.1 C)  BP 100/62 BP  Min: 93/51  Max: 124/90  HR 76 Pulse  Avg: 87.9  Min: 76  Max: 98  RR 17 Resp  Avg: 17.2  Min: 16  Max: 18  SaO2 99 % Room Air SpO2  Avg: 99.7 %  Min: 99 %  Max: 100 %       24 Hour I/O Current Shift I/O  Time Ins Outs 03/30 0701 - 03/31 0700 In: 100  Out: -  No intake/output data recorded.   Patient Vitals for the past 24 hrs:  BP Temp Temp src Pulse Resp SpO2 Height Weight  12/28/23 0814 100/62 98.3 F (36.8 C) Oral 76 17 99 % -- --  12/28/23 0541 (!) 93/51 98.3 F (36.8 C) Oral 80 18 100 % -- --  12/28/23 0242 117/74 98.5 F (36.9 C) Oral 86 16 100 % -- --  12/28/23 0204 109/77 98.7 F (37.1 C) Oral 98 18 100 % 5\' 3"  (1.6 m) 55.8 kg  12/28/23 0100 123/77 -- -- 89 -- 99 % -- --  12/28/23 0034 117/73 98.2 F (36.8 C) -- 91 16 100 % -- --  12/27/23 1947 (!) 124/90 98 F (36.7 C) -- 95 18 100 % -- --    Body mass index is 21.79 kg/m. General appearance: Well nourished, well developed female in no acute distress.  Cardiovascular: S1, S2 normal, no murmur, rub or gallop, regular rate and rhythm Respiratory:  Clear to auscultation bilateral. Normal respiratory effort Abdomen: soft, minimally ttp in low pelvis, no peritoneal s/s, no distension Neuro/Psych:  Normal mood and affect.  Skin:  Warm and dry.  Extremities: no clubbing, cyanosis, or edema.   Laboratory: Beta HCG:  negative Pending: GC/CT, rpr, hepB Negative hiv  Recent Labs  Lab 12/27/23 1949  WBC 5.2  HGB 10.5*  HCT 33.3*  PLT 180   Recent Labs  Lab 12/27/23 1949  NA 135  K 3.6  CL 104  CO2 24  BUN 10  CREATININE 0.67  CALCIUM 9.1  PROT 7.3  BILITOT 0.2  ALKPHOS 41  ALT 10  AST 13*  GLUCOSE 77   Imaging:  Narrative & Impression  CLINICAL DATA:  Left lower quadrant abdominal pain and right lower quadrant abdominal pain   EXAM: CT ABDOMEN AND PELVIS WITH CONTRAST   TECHNIQUE: Multidetector CT imaging of the abdomen and pelvis was performed using the standard protocol following bolus administration of intravenous contrast.   RADIATION DOSE REDUCTION: This exam was performed according to the departmental dose-optimization program which includes automated exposure control, adjustment of the mA and/or kV according to patient size and/or use of iterative reconstruction technique.   CONTRAST:  OMNIPAQUE IOHEXOL 300 MG/ML  SOLN   COMPARISON:  CT abdomen pelvis 06/13/2019   FINDINGS: Lower chest: Airspace opacities in the anterior left lower lobe suspicious for pneumonia.   Hepatobiliary: Unremarkable liver. Normal gallbladder. No biliary dilation.   Pancreas: Unremarkable.   Spleen: Unremarkable.   Adrenals/Urinary Tract: Normal adrenal glands. No urinary calculi or hydronephrosis. Bladder  is unremarkable.   Stomach/Bowel: Normal caliber large and small bowel. No bowel wall thickening. The appendix is normal.Stomach is within normal limits.   Vascular/Lymphatic: No significant vascular findings are present. No enlarged abdominal or pelvic lymph nodes.   Reproductive: Dilated tubular structure in the left pelvis measuring 6.5 x 2.9 cm suspicious for dilated fallopian tube. Adjacent free fluid in the pelvis. Uterus and right adnexa are unremarkable.   Other: No free intraperitoneal fluid or air.   Musculoskeletal: No acute fracture.   IMPRESSION: 1.  Dilated tubular structure in the left pelvis measuring 6.5 x 2.9 cm suspicious for dilated Fallopian tube. Adjacent free fluid in the pelvis. Findings are suspicious for left-sided salpingitis/pyosalpinx. Recommend further evaluation with pelvic ultrasound. 2. Airspace opacities in the anterior left lower lobe suspicious for pneumonia.     Electronically Signed   By: Minerva Fester M.D.   On: 12/27/2023 23:17   Assessment: patient stable  Plan: *Continue cefoxitin, doxy and flagyl; follow up pending labs and studies *Chest: ?abnormal chest CT. Will get plain films and covid swab; pt asymptomatic *FEN/GI: regular diet *PPx: lovenox *Pain: PO PRNs *Dispo: potentially wednesday  Total time taking care of the patient was 30 minutes, with greater than 50% of the time spent in face to face interaction with the patient.  Cornelia Copa MD Attending Center for [facility] (Faculty Practice) GYN Consult Phone: [phone] (M-F, [phone]) & [phone] (Off hours, weekends, holidays)

## 2023-12-28 NOTE — Plan of Care (Signed)
  Problem: Education: Goal: Knowledge of General Education information will improve Description: Including pain rating scale, medication(s)/side effects and non-pharmacologic comfort measures Outcome: Progressing   Problem: Health Behavior/Discharge Planning: Goal: Ability to manage health-related needs will improve Outcome: Progressing   Problem: Clinical Measurements: Goal: Will remain free from infection Outcome: Progressing   Problem: Clinical Measurements: Goal: Cardiovascular complication will be avoided Outcome: Progressing   Problem: Activity: Goal: Risk for activity intolerance will decrease Outcome: Progressing   Problem: Nutrition: Goal: Adequate nutrition will be maintained Outcome: Progressing   Problem: Coping: Goal: Level of anxiety will decrease Outcome: Progressing

## 2023-12-29 LAB — CBC
HCT: 33.1 % — ABNORMAL LOW (ref 36.0–46.0)
Hemoglobin: 10.5 g/dL — ABNORMAL LOW (ref 12.0–15.0)
MCH: 22.7 pg — ABNORMAL LOW (ref 26.0–34.0)
MCHC: 31.7 g/dL (ref 30.0–36.0)
MCV: 71.6 fL — ABNORMAL LOW (ref 80.0–100.0)
Platelets: 158 10*3/uL (ref 150–400)
RBC: 4.62 MIL/uL (ref 3.87–5.11)
RDW: 13.2 % (ref 11.5–15.5)
WBC: 2.9 10*3/uL — ABNORMAL LOW (ref 4.0–10.5)
nRBC: 0 % (ref 0.0–0.2)

## 2023-12-29 LAB — GC/CHLAMYDIA PROBE AMP (~~LOC~~) NOT AT ARMC
Chlamydia: NEGATIVE
Comment: NEGATIVE
Comment: NORMAL
Neisseria Gonorrhea: NEGATIVE

## 2023-12-29 LAB — RPR
RPR Ser Ql: REACTIVE — AB
RPR Titer: 1:1 {titer}

## 2023-12-29 MED ORDER — SODIUM CHLORIDE 0.9 % IV SOLN
2.0000 g | Freq: Four times a day (QID) | INTRAVENOUS | Status: DC
  Administered 2023-12-29 – 2023-12-30 (×4): 2 g via INTRAVENOUS
  Filled 2023-12-29 (×5): qty 2

## 2023-12-29 NOTE — Progress Notes (Signed)
 Patient ID: Laura [name], female   DOB: Mar 18, 2001, 23 y.o.   MRN: [id number]   Assessment/Plan: Principal Problem:   Tubo-ovarian abscess Active Problems:   TOA (tubo-ovarian abscess)   Abnormal chest CT  Continue IV antibiotics Pain meds as needed Chest x-ray done yesterday reveals continued small infiltrate in the anterior portion of the left lower lobe of unclear etiology Will add on pneumonia pathogens though that seems less likely as patient is without cough and her pain is all lower.  Subjective: Interval History: Patient continues to report that she feels lower abdominal pain and tenderness though this seems to be improving.  She is without nausea or vomiting.  Objective: Vital signs in last 24 hours: Temp:  [98.1 F (36.7 C)-98.6 F (37 C)] 98.1 F (36.7 C) (04/01 0846) Pulse Rate:  [81-94] 81 (04/01 0846) Resp:  [17-18] 17 (04/01 0846) BP: (91-109)/(52-71) 102/71 (04/01 0846) SpO2:  [98 %-100 %] 100 % (04/01 0846)  Intake/Output from previous day: 03/31 0701 - 04/01 0700 In: 1020 [P.O.:820] Out: -  Intake/Output this shift: No intake/output data recorded.  General appearance: alert, cooperative, and appears stated age Head: Normocephalic, without obvious abnormality, atraumatic Neck: supple, symmetrical, trachea midline Lungs: Normal effort Heart: regular rate and rhythm Abdomen: Soft, left lower quadrant tenderness, right lower quadrant rebound, guarding noted throughout Extremities: Homans sign is negative, no sign of DVT  Results for orders placed or performed during the hospital encounter of 12/27/23 (from the past 24 hours)  Resp panel by RT-PCR (RSV, Flu A&B, Covid) Anterior Nasal Swab     Status: None   Collection Time: 12/28/23 11:24 AM   Specimen: Anterior Nasal Swab  Result Value Ref Range   SARS Coronavirus 2 by RT PCR NEGATIVE NEGATIVE   Influenza A by PCR NEGATIVE NEGATIVE   Influenza B by PCR NEGATIVE NEGATIVE   Resp Syncytial Virus by PCR  NEGATIVE NEGATIVE  CBC     Status: Abnormal   Collection Time: 12/29/23  6:14 AM  Result Value Ref Range   WBC 2.9 (L) 4.0 - 10.5 K/uL   RBC 4.62 3.87 - 5.11 MIL/uL   Hemoglobin 10.5 (L) 12.0 - 15.0 g/dL   HCT 40.9 (L) 81.1 - 91.4 %   MCV 71.6 (L) 80.0 - 100.0 fL   MCH 22.7 (L) 26.0 - 34.0 pg   MCHC 31.7 30.0 - 36.0 g/dL   RDW 78.2 95.6 - 21.3 %   Platelets 158 150 - 400 K/uL   nRBC 0.0 0.0 - 0.2 %    Studies/Results: DG Chest 2 View Result Date: 12/28/2023 CLINICAL DATA:  Follow up abnormal CT.  Two ovarian abscess. EXAM: CHEST - 2 VIEW COMPARISON:  Chest radiographs 09/27/2023 and 08/27/2023. Abdominal CT 12/27/2023. FINDINGS: The heart size and mediastinal contours are stable. As seen on recent abdominal CT, there is focal airspace disease anteriorly in the left lower lobe suspicious for a small infiltrate. The lungs otherwise appear clear. There is no pleural effusion or pneumothorax. The bones appear unremarkable. Telemetry leads overlie the chest. IMPRESSION: Persistent focal airspace disease anteriorly in the left lower lobe suspicious for pneumonia. No other evidence of acute cardiopulmonary process. Electronically Signed   By: Carey Bullocks M.D.   On: 12/28/2023 11:07   CT ABDOMEN PELVIS W CONTRAST Result Date: 12/27/2023 CLINICAL DATA:  Left lower quadrant abdominal pain and right lower quadrant abdominal pain EXAM: CT ABDOMEN AND PELVIS WITH CONTRAST TECHNIQUE: Multidetector CT imaging of the abdomen and pelvis was performed using  the standard protocol following bolus administration of intravenous contrast. RADIATION DOSE REDUCTION: This exam was performed according to the departmental dose-optimization program which includes automated exposure control, adjustment of the mA and/or kV according to patient size and/or use of iterative reconstruction technique. CONTRAST:  OMNIPAQUE IOHEXOL 300 MG/ML  SOLN COMPARISON:  CT abdomen pelvis 06/13/2019 FINDINGS: Lower chest: Airspace  opacities in the anterior left lower lobe suspicious for pneumonia. Hepatobiliary: Unremarkable liver. Normal gallbladder. No biliary dilation. Pancreas: Unremarkable. Spleen: Unremarkable. Adrenals/Urinary Tract: Normal adrenal glands. No urinary calculi or hydronephrosis. Bladder is unremarkable. Stomach/Bowel: Normal caliber large and small bowel. No bowel wall thickening. The appendix is normal.Stomach is within normal limits. Vascular/Lymphatic: No significant vascular findings are present. No enlarged abdominal or pelvic lymph nodes. Reproductive: Dilated tubular structure in the left pelvis measuring 6.5 x 2.9 cm suspicious for dilated fallopian tube. Adjacent free fluid in the pelvis. Uterus and right adnexa are unremarkable. Other: No free intraperitoneal fluid or air. Musculoskeletal: No acute fracture. IMPRESSION: 1. Dilated tubular structure in the left pelvis measuring 6.5 x 2.9 cm suspicious for dilated Fallopian tube. Adjacent free fluid in the pelvis. Findings are suspicious for left-sided salpingitis/pyosalpinx. Recommend further evaluation with pelvic ultrasound. 2. Airspace opacities in the anterior left lower lobe suspicious for pneumonia. Electronically Signed   By: Minerva Fester M.D.   On: 12/27/2023 23:17    Scheduled Meds:  doxycycline  100 mg Oral Q12H   enoxaparin (LOVENOX) injection  40 mg Subcutaneous Q24H   metroNIDAZOLE  500 mg Oral Q12H   polyethylene glycol  17 g Oral Daily   sodium chloride flush  3 mL Intravenous Q12H   Continuous Infusions:  cefOXitin 2 g (12/29/23 0613)   PRN Meds:ibuprofen, ondansetron **OR** ondansetron (ZOFRAN) IV, oxyCODONE-acetaminophen, sodium chloride flush    LOS: 1 day   Reva Bores, MD 12/29/2023 11:17 AM

## 2023-12-29 NOTE — Plan of Care (Signed)
  Problem: Education: Goal: Knowledge of General Education information will improve Description: Including pain rating scale, medication(s)/side effects and non-pharmacologic comfort measures Outcome: Progressing   Problem: Health Behavior/Discharge Planning: Goal: Ability to manage health-related needs will improve Outcome: Progressing   Problem: Clinical Measurements: Goal: Ability to maintain clinical measurements within normal limits will improve Outcome: Progressing Goal: Will remain free from infection Outcome: Progressing Goal: Diagnostic test results will improve Outcome: Progressing Goal: Respiratory complications will improve Outcome: Progressing Goal: Cardiovascular complication will be avoided Outcome: Progressing   Problem: Activity: Goal: Risk for activity intolerance will decrease Outcome: Progressing   Problem: Nutrition: Goal: Adequate nutrition will be maintained Outcome: Progressing   Problem: Coping: Goal: Level of anxiety will decrease Outcome: Progressing   Problem: Elimination: Goal: Will not experience complications related to urinary retention Outcome: Progressing   Problem: Pain Managment: Goal: General experience of comfort will improve and/or be controlled Outcome: Progressing   Problem: Safety: Goal: Ability to remain free from injury will improve Outcome: Progressing   Problem: Skin Integrity: Goal: Risk for impaired skin integrity will decrease Outcome: Progressing

## 2023-12-29 NOTE — Progress Notes (Signed)
   12/29/23 1421  TOC Brief Assessment  Insurance and Status Reviewed  Patient has primary care physician Yes  Home environment has been reviewed yes  Prior level of function: independent  Prior/Current Home Services No current home services  Social Drivers of Health Review SDOH reviewed no interventions necessary  Readmission risk has been reviewed Yes  Transition of care needs no transition of care needs at this time

## 2023-12-30 ENCOUNTER — Inpatient Hospital Stay (HOSPITAL_COMMUNITY)

## 2023-12-30 ENCOUNTER — Encounter (HOSPITAL_COMMUNITY): Payer: Self-pay | Admitting: Obstetrics and Gynecology

## 2023-12-30 DIAGNOSIS — A530 Latent syphilis, unspecified as early or late: Secondary | ICD-10-CM | POA: Diagnosis present

## 2023-12-30 DIAGNOSIS — R768 Other specified abnormal immunological findings in serum: Secondary | ICD-10-CM | POA: Diagnosis present

## 2023-12-30 DIAGNOSIS — Z8619 Personal history of other infectious and parasitic diseases: Secondary | ICD-10-CM | POA: Diagnosis present

## 2023-12-30 LAB — TYPE AND SCREEN
ABO/RH(D): A POS
Antibody Screen: NEGATIVE

## 2023-12-30 LAB — CBC
HCT: 33.1 % — ABNORMAL LOW (ref 36.0–46.0)
Hemoglobin: 10.4 g/dL — ABNORMAL LOW (ref 12.0–15.0)
MCH: 22.5 pg — ABNORMAL LOW (ref 26.0–34.0)
MCHC: 31.4 g/dL (ref 30.0–36.0)
MCV: 71.6 fL — ABNORMAL LOW (ref 80.0–100.0)
Platelets: 192 10*3/uL (ref 150–400)
RBC: 4.62 MIL/uL (ref 3.87–5.11)
RDW: 13.2 % (ref 11.5–15.5)
WBC: 2.6 10*3/uL — ABNORMAL LOW (ref 4.0–10.5)
nRBC: 0 % (ref 0.0–0.2)

## 2023-12-30 LAB — ABO/RH: ABO/RH(D): A POS

## 2023-12-30 LAB — LEGIONELLA PNEUMOPHILA SEROGP 1 UR AG: L. pneumophila Serogp 1 Ur Ag: NEGATIVE

## 2023-12-30 MED ORDER — FENTANYL CITRATE (PF) 100 MCG/2ML IJ SOLN
INTRAMUSCULAR | Status: AC
  Filled 2023-12-30: qty 2

## 2023-12-30 MED ORDER — DEXTROSE IN LACTATED RINGERS 5 % IV SOLN: INTRAVENOUS | Status: AC

## 2023-12-30 MED ORDER — SODIUM CHLORIDE 0.9 % IV SOLN
1.0000 g | INTRAVENOUS | Status: DC
  Administered 2023-12-30 – 2024-01-03 (×5): 1 g via INTRAVENOUS
  Filled 2023-12-30 (×4): qty 10

## 2023-12-30 MED ORDER — IOHEXOL 350 MG/ML SOLN
60.0000 mL | Freq: Once | INTRAVENOUS | Status: AC | PRN
  Administered 2023-12-30: 60 mL via INTRAVENOUS

## 2023-12-30 MED ORDER — DEXTROSE IN LACTATED RINGERS 5 % IV SOLN: INTRAVENOUS | Status: DC

## 2023-12-30 MED ORDER — MIDAZOLAM HCL 2 MG/2ML IJ SOLN
INTRAMUSCULAR | Status: AC
Start: 2023-12-30 — End: ?
  Filled 2023-12-30: qty 2

## 2023-12-30 NOTE — Plan of Care (Signed)

## 2023-12-30 NOTE — Consult Note (Signed)
 Chief Complaint: Patient was seen in consultation today for tubo-ovarian abscess, with consideration for drainage.  Referring Provider(s): Dr. Bressler Bing, MD  Supervising Physician: Gilmer Mor  Patient Status: [facility] - In-pt  Patient is Full Code  History of Present Illness: Laura [name] is a 23 y.o. female with PMHx notable for asthma, anemia, chlamydia, gonorrhea, genital herpes, anxiety and depression.  Patient presented to ED on 3/30 with complaints of several days of lower abdominal pain with constipation. CT A/P w/ contrast was notable for  left-sided tubo-ovarian abscess measuring 6.5 x 2.9 cm, with adjacent free fluid in the pelvis. Patient was admitted, and started on antibiotics. However, she has not clinically improved in 2 days.  Interventional Radiology was requested for intra-abdominal abscess drainage. Request was reviewed and approved by Dr. Loreta Ave. Patient is tentatively scheduled for same in IR today.  All labs and medications are within acceptable parameters. No pertinent allergies. Patient has been NPO since midnight.    Patient is alert and laying in bed,calm. Currently without any significant complaints. She is experiencing nausea and mild dizziness, with last emesis episode yesterday. Her pain in worst on standing and ambulating, and tolerable when laying in bed. Denies any fevers, headache, chest pain, SOB, cough, or bleeding.      Past Medical History:  Diagnosis Date   Anemia    Anxiety    Asthma    Chlamydia    Depression    Genital herpes    Gonorrhea    Ovarian cyst     Past Surgical History:  Procedure Laterality Date   OVARIAN CYST REMOVAL      Allergies: Patient has no known allergies.  Medications: Prior to Admission medications   Medication Sig Start Date End Date Taking? Authorizing Provider  albuterol (PROVENTIL) (2.5 MG/3ML) 0.083% nebulizer solution Take 3 mLs (2.5 mg total) by nebulization every 6 (six) hours as  needed for wheezing or shortness of breath. 01/27/22  Yes Roemhildt, Lorin T, PA-C  albuterol (VENTOLIN HFA) 108 (90 Base) MCG/ACT inhaler Inhale 2 puffs into the lungs every 6 (six) hours as needed for wheezing or shortness of breath.   Yes [provider]  bisacodyl (DULCOLAX) 5 MG EC tablet Take 5 mg by mouth daily as needed for moderate constipation.   Yes [provider]  medroxyPROGESTERone (DEPO-PROVERA) 150 MG/ML injection Inject 150 mg into the muscle every 3 (three) months. 07/13/23  Yes [provider]  polyethylene glycol (MIRALAX / GLYCOLAX) 17 g packet Take 17 g by mouth daily as needed for mild constipation, moderate constipation or severe constipation.   Yes [provider]  busPIRone (BUSPAR) 5 MG tablet Take 5 mg by mouth daily. Patient not taking: Reported on 12/28/2023 07/13/23   [provider]  escitalopram (LEXAPRO) 10 MG tablet Take 10 mg by mouth at bedtime. Patient not taking: Reported on 12/28/2023 06/17/22   [provider]     Family History  Problem Relation Age of Onset   Epilepsy Father     Social History   Socioeconomic History   Marital status: Single    Spouse name: Not on file   Number of children: Not on file   Years of education: Not on file   Highest education level: Not on file  Occupational History   Not on file  Tobacco Use   Smoking status: Former    Current packs/day: 0.00    Types: Cigarettes, Cigars    Quit date: 10/07/2018  Years since quitting: 5.2   Smokeless tobacco: Never  Vaping Use   Vaping status: Some Days  Substance and Sexual Activity   Alcohol use: Yes   Drug use: No   Sexual activity: Yes    Birth control/protection: Injection  Other Topics Concern   Not on file  Social History Narrative   Not on file   Social Drivers of Health   Financial Resource Strain: Low Risk  (12/11/2022)   Received from [facility], [facility]   Overall Financial Resource Strain  (CARDIA)    Difficulty of Paying Living Expenses: Not hard at all  Food Insecurity: No Food Insecurity (12/28/2023)   Hunger Vital Sign    Worried About Running Out of Food in the Last Year: Never true    Ran Out of Food in the Last Year: Never true  Transportation Needs: No Transportation Needs (12/28/2023)   PRAPARE - Administrator, Civil Service (Medical): No    Lack of Transportation (Non-Medical): No  Physical Activity: Unknown (12/11/2022)   Received from [facility]   Exercise Vital Sign    Days of Exercise per Week: 0 days    Minutes of Exercise per Session: Not on file  Recent Concern: Physical Activity - Inactive (12/11/2022)   Received from [facility], [facility]   Exercise Vital Sign    Days of Exercise per Week: 0 days    Minutes of Exercise per Session: 0 min  Stress: Stress Concern Present (12/11/2022)   Received from [facility], [facility] - Occupational Stress Questionnaire    Feeling of Stress : Rather much  Social Connections: Socially Integrated (12/11/2022)   Received from [facility], [facility]   Social Network    How would you rate your social network (family, work, friends)?: Good participation with social networks     Review of Systems: A 12 point ROS discussed and pertinent positives are indicated in the HPI above.  All other systems are negative.  Vital Signs: BP 98/60 (BP Location: Right Arm)   Pulse 71   Temp 98.5 F (36.9 C) (Oral)   Resp 17   Ht 5\' 3"  (1.6 m)   Wt 123 lb 0.3 oz (55.8 kg)   SpO2 100%   BMI 21.79 kg/m   Advance Care Plan: The advanced care place/surrogate decision maker was discussed at the time of visit and the patient did not wish to discuss or was not able to name a surrogate decision maker or provide an advance care plan.  Physical Exam Constitutional:      General: She is not in acute distress.    Appearance: Normal appearance.  HENT:      Mouth/Throat:     Mouth: Mucous membranes are dry.  Cardiovascular:     Rate and Rhythm: Normal rate and regular rhythm.     Heart sounds: No murmur heard. Pulmonary:     Effort: Pulmonary effort is normal.     Breath sounds: Normal breath sounds. No wheezing.  Abdominal:     General: Abdomen is flat. There is no distension.     Tenderness: There is abdominal tenderness.     Comments: Lower and midline abdominal tenderness to palpation, without radiation.  Musculoskeletal:        General: Normal range of motion.  Skin:    General: Skin is warm and dry.  Neurological:     Mental Status: She is alert and oriented to person,  place, and time.  Psychiatric:        Mood and Affect: Mood normal.        Behavior: Behavior normal.        Thought Content: Thought content normal.        Judgment: Judgment normal.     Imaging: DG Chest 2 View Result Date: 12/28/2023 CLINICAL DATA:  Follow up abnormal CT.  Two ovarian abscess. EXAM: CHEST - 2 VIEW COMPARISON:  Chest radiographs 09/27/2023 and 08/27/2023. Abdominal CT 12/27/2023. FINDINGS: The heart size and mediastinal contours are stable. As seen on recent abdominal CT, there is focal airspace disease anteriorly in the left lower lobe suspicious for a small infiltrate. The lungs otherwise appear clear. There is no pleural effusion or pneumothorax. The bones appear unremarkable. Telemetry leads overlie the chest. IMPRESSION: Persistent focal airspace disease anteriorly in the left lower lobe suspicious for pneumonia. No other evidence of acute cardiopulmonary process. Electronically Signed   By: Carey Bullocks M.D.   On: 12/28/2023 11:07   CT ABDOMEN PELVIS W CONTRAST Result Date: 12/27/2023 CLINICAL DATA:  Left lower quadrant abdominal pain and right lower quadrant abdominal pain EXAM: CT ABDOMEN AND PELVIS WITH CONTRAST TECHNIQUE: Multidetector CT imaging of the abdomen and pelvis was performed using the standard protocol following bolus  administration of intravenous contrast. RADIATION DOSE REDUCTION: This exam was performed according to the departmental dose-optimization program which includes automated exposure control, adjustment of the mA and/or kV according to patient size and/or use of iterative reconstruction technique. CONTRAST:  OMNIPAQUE IOHEXOL 300 MG/ML  SOLN COMPARISON:  CT abdomen pelvis 06/13/2019 FINDINGS: Lower chest: Airspace opacities in the anterior left lower lobe suspicious for pneumonia. Hepatobiliary: Unremarkable liver. Normal gallbladder. No biliary dilation. Pancreas: Unremarkable. Spleen: Unremarkable. Adrenals/Urinary Tract: Normal adrenal glands. No urinary calculi or hydronephrosis. Bladder is unremarkable. Stomach/Bowel: Normal caliber large and small bowel. No bowel wall thickening. The appendix is normal.Stomach is within normal limits. Vascular/Lymphatic: No significant vascular findings are present. No enlarged abdominal or pelvic lymph nodes. Reproductive: Dilated tubular structure in the left pelvis measuring 6.5 x 2.9 cm suspicious for dilated fallopian tube. Adjacent free fluid in the pelvis. Uterus and right adnexa are unremarkable. Other: No free intraperitoneal fluid or air. Musculoskeletal: No acute fracture. IMPRESSION: 1. Dilated tubular structure in the left pelvis measuring 6.5 x 2.9 cm suspicious for dilated Fallopian tube. Adjacent free fluid in the pelvis. Findings are suspicious for left-sided salpingitis/pyosalpinx. Recommend further evaluation with pelvic ultrasound. 2. Airspace opacities in the anterior left lower lobe suspicious for pneumonia. Electronically Signed   By: Minerva Fester M.D.   On: 12/27/2023 23:17    Labs:  CBC: Recent Labs    08/31/23 1230 12/27/23 1949 12/29/23 0614 12/30/23 0607  WBC 3.9* 5.2 2.9* 2.6*  HGB 10.4* 10.5* 10.5* 10.4*  HCT 32.4* 33.3* 33.1* 33.1*  PLT 103* 180 158 192    COAGS: No results for input(s): "INR", "APTT" in the last 8760  hours.  BMP: Recent Labs    08/03/23 0844 08/27/23 0808 08/31/23 1230 12/27/23 1949 12/28/23 0754  NA 136 136 133* 135  --   K 3.5 3.6 3.4* 3.6  --   CL 104 106 99 104  --   CO2 25 24 26 24   --   GLUCOSE 85 85 98 77  --   BUN 15 13 10 10   --   CALCIUM 9.8 8.5* 8.1* 9.1  --   CREATININE 0.83 0.74 0.86 0.67 0.78  GFRNONAA >  60 >60 >60 >60 >60    LIVER FUNCTION TESTS: Recent Labs    02/25/23 0222 08/03/23 0844 08/31/23 1230 12/27/23 1949  BILITOT 0.6 0.6 0.6 0.2  AST 18 15 18  13*  ALT 13 9 8 10   ALKPHOS 35* 36* 23* 41  PROT 7.1 7.7 6.5 7.3  ALBUMIN 3.6 4.1 3.4* 3.8    TUMOR MARKERS: No results for input(s): "AFPTM", "CEA", "CA199", "CHROMGRNA" in the last 8760 hours.  Assessment and Plan: Patient presented to ED on 3/30 with complaints of several days of lower abdominal pain with constipation. CT A/P w/ contrast was notable for  left-sided tubo-ovarian abscess measuring 6.5 x 2.9 cm, with adjacent free fluid in the pelvis. Patient was admitted, and started on antibiotics. However, she has not clinically improved in 2 days. She was recommended for drain placement.  Patient presents for tentatively scheduled intra-abdominal abscess drainage in IR today.   Risks and benefits discussed with the patient including bleeding, infection, damage to adjacent structures, bowel perforation/fistula connection, and sepsis.  All of the patient's questions were answered, patient is agreeable to proceed.  Consent signed and in chart.    Thank you for allowing our service to participate in Laura [name] 's care.  Electronically Signed: Sable Feil, PA-C   12/30/2023, 2:27 PM      I spent a total of 40 Minutes  in face to face in clinical consultation, greater than 50% of which was counseling/coordinating care for tubo-ovarian abscess, with consideration for drainage.

## 2023-12-30 NOTE — Progress Notes (Signed)
 GYN Note RN stated that patient did not receive lovenox and will go back and update the MAR. Patient made NPO again with MIVF.   Cornelia Copa MD Attending Center for [employer] (Faculty Practice) 12/30/2023 Time: 1045am \

## 2023-12-30 NOTE — Progress Notes (Signed)
 Pt continues to have nausea. PRN Zofran were given at 3 am. Vitals stable. No other complaints. Plan of care ongoing.

## 2023-12-30 NOTE — Procedures (Signed)
 Interventional Radiology Procedure Note  Procedure: Attempted drainage of TOA.   Findings: There is no safe window for drainage.  Will defer at this time.  Patient aware.         Recommendations: - continue care - available to review any follow up imaging that is performed  Signed,  Yvone Neu. Loreta Ave, DO, ABVM, RPVI

## 2023-12-30 NOTE — Sedation Documentation (Signed)
 Per MD cannot reach area  safely to drain. Procedure Cancelled

## 2023-12-30 NOTE — Progress Notes (Signed)
 Gynecology Progress Note  Admission Date: 12/27/2023 Current Date: 12/30/2023 10:03 AM  Laura [name] is a 23 y.o. [phone] admitted for 6-7cm left TOA   History complicated by: Patient Active Problem List   Diagnosis Date Noted   Positive RPR test 12/30/2023   History of sexually transmitted disease 12/30/2023   Tubo-ovarian abscess 12/28/2023   TOA (tubo-ovarian abscess) 12/28/2023   Abnormal chest CT 12/28/2023    ROS and patient/family/surgical history, located on admission H&P note dated 12/27/2023, have been reviewed, and there are no changes except as noted below Yesterday/Overnight Events:  none  Subjective:  Patient with nausea and some vomiting this morning and not feeling better. Pain still only in mid low pelvis.   Objective:    Current Vital Signs 24h Vital Sign Ranges  T 98.5 F (36.9 C) Temp  Avg: 98.4 F (36.9 C)  Min: 98.2 F (36.8 C)  Max: 98.6 F (37 C)  BP 98/60 BP  Min: 98/60  Max: 103/60  HR 71 Pulse  Avg: 72.8  Min: 62  Max: 86  RR 17 Resp  Avg: 17.8  Min: 17  Max: 18  SaO2 100 % Room Air SpO2  Avg: 99.8 %  Min: 99 %  Max: 100 %       24 Hour I/O Current Shift I/O  Time Ins Outs 04/01 0701 - 04/02 0700 In: 880 [P.O.:680] Out: 0  No intake/output data recorded.   Patient Vitals for the past 24 hrs:  BP Temp Temp src Pulse Resp SpO2  12/30/23 0801 98/60 98.5 F (36.9 C) Oral 71 17 100 %  12/30/23 0428 (!) 102/58 98.2 F (36.8 C) Oral 62 18 100 %  12/29/23 1958 103/60 98.4 F (36.9 C) Oral 72 18 100 %  12/29/23 1557 102/64 98.6 F (37 C) Oral 86 18 99 %    Physical exam: General appearance:  laying in bed on side, lethargic Abdomen:  mildly ttp in mid low pelvis, no peritoneal s/s GU: No gross VB Lungs: clear to auscultation bilaterally Heart: S1, S2 normal, no murmur, rub or gallop, regular rate and rhythm Extremities: no lower extremity edema Skin: feels warm Psych: appropriate Neurologic: Grossly normal  Medications Current  Facility-Administered Medications  Medication Dose Route Frequency Provider Last Rate Last Admin   cefOXitin (MEFOXIN) 2 g in sodium chloride 0.9 % 100 mL IVPB  2 g Intravenous Q6H [place] Bing, MD 200 mL/hr at 12/30/23 0524 2 g at 12/30/23 0524   doxycycline (VIBRA-TABS) tablet 100 mg  100 mg Oral Q12H [place] Bing, MD   100 mg at 12/29/23 2104   enoxaparin (LOVENOX) injection 40 mg  40 mg Subcutaneous Q24H [place] Bing, MD       ibuprofen (ADVIL) tablet 600 mg  600 mg Oral Q6H PRN [place] Bing, MD       metroNIDAZOLE (FLAGYL) tablet 500 mg  500 mg Oral Q12H [place] Bing, MD   500 mg at 12/29/23 2104   ondansetron (ZOFRAN) tablet 4 mg  4 mg Oral Q6H PRN [place] Bing, MD   4 mg at 12/28/23 6962   Or   ondansetron (ZOFRAN) injection 4 mg  4 mg Intravenous Q6H PRN [place] Bing, MD   4 mg at 12/30/23 0303   oxyCODONE-acetaminophen (PERCOCET/ROXICET) 5-325 MG per tablet 1-2 tablet  1-2 tablet Oral Q6H PRN [place] Bing, MD   2 tablet at 12/30/23 0816   polyethylene glycol (MIRALAX / GLYCOLAX) packet 17 g  17 g Oral Daily [place] Bing, MD  17 g at 12/29/23 0949   sodium chloride flush (NS) 0.9 % injection 3 mL  3 mL Intravenous Q12H [place] Bing, MD   3 mL at 12/29/23 2108   sodium chloride flush (NS) 0.9 % injection 3 mL  3 mL Intravenous PRN [place] Bing, MD        Labs  Pending: legionella urin ag. T. Pallidum Negative: GC/CT, wet prep  Recent Labs  Lab 12/27/23 1949 12/29/23 0614 12/30/23 0607  WBC 5.2 2.9* 2.6*  HGB 10.5* 10.5* 10.4*  HCT 33.3* 33.1* 33.1*  PLT 180 158 192    Recent Labs  Lab 12/27/23 1949 12/28/23 0754  NA 135  --   K 3.6  --   CL 104  --   CO2 24  --   BUN 10  --   CREATININE 0.67 0.78  CALCIUM 9.1  --   PROT 7.3  --   BILITOT 0.2  --   ALKPHOS 41  --   ALT 10  --   AST 13*  --   GLUCOSE 77  --    Radiology No new imaging  Assessment & Plan:  Patient stable *GYN: d/w her recommend IR consult since not  clinically improving. Continue cefoxitin/doxy/flagyl *Respiratory: patient continues to deny any respiratory s/s. I told her I recommend a chest CT. Will hold off until seen by IR in case they want to re-image her and they can scan everything at the same time. Patient has normal housing, and denies any h/o syphilis or working in healthcare or h/o lung infections. She does state that she works at [employer] and she has to wear a respiratory.  *+RPR: see above. F/u pending t.pal *Pain: continue with PO PRNs *FEN/GI: NPO with meds at midnight. Regular diet. Saline lock IV *PPx: lovenox received this morning and d/c'ed now in case can drain tomorrow. SCDs ordered *Dispo: inpatient for now  Code Status: Full Code  Total time taking care of the patient was 30 minutes, with greater than 50% of the time spent in face to face interaction with the patient.  Cornelia Copa MD Attending Center for [facility] (Faculty Practice) GYN Consult Phone: [phone] (M-F, 0800-1700) & [phone] (Off hours, weekends, holidays)

## 2023-12-31 LAB — CBC
HCT: 32.8 % — ABNORMAL LOW (ref 36.0–46.0)
Hemoglobin: 10.6 g/dL — ABNORMAL LOW (ref 12.0–15.0)
MCH: 22.9 pg — ABNORMAL LOW (ref 26.0–34.0)
MCHC: 32.3 g/dL (ref 30.0–36.0)
MCV: 71 fL — ABNORMAL LOW (ref 80.0–100.0)
Platelets: 176 10*3/uL (ref 150–400)
RBC: 4.62 MIL/uL (ref 3.87–5.11)
RDW: 13.1 % (ref 11.5–15.5)
WBC: 2.7 10*3/uL — ABNORMAL LOW (ref 4.0–10.5)
nRBC: 0 % (ref 0.0–0.2)

## 2023-12-31 MED ORDER — METRONIDAZOLE 500 MG/100ML IV SOLN
500.0000 mg | Freq: Two times a day (BID) | INTRAVENOUS | Status: DC
  Administered 2023-12-31 – 2024-01-04 (×8): 500 mg via INTRAVENOUS
  Filled 2023-12-31 (×8): qty 100

## 2023-12-31 MED ORDER — SODIUM CHLORIDE 0.9 % IV SOLN
100.0000 mg | Freq: Two times a day (BID) | INTRAVENOUS | Status: DC
  Administered 2023-12-31 – 2024-01-03 (×6): 100 mg via INTRAVENOUS
  Filled 2023-12-31 (×6): qty 100

## 2023-12-31 MED ORDER — DEXTROSE IN LACTATED RINGERS 5 % IV SOLN: INTRAVENOUS | Status: AC

## 2023-12-31 MED ORDER — BISACODYL 5 MG PO TBEC
5.0000 mg | DELAYED_RELEASE_TABLET | Freq: Every day | ORAL | Status: AC | PRN
  Administered 2023-12-31 – 2024-01-01 (×2): 5 mg via ORAL
  Filled 2023-12-31 (×2): qty 1

## 2023-12-31 NOTE — Progress Notes (Addendum)
 Gynecology Progress Note  Admission Date: 12/27/2023 Current Date: 12/31/2023 9:32 AM  Laura [name] is a 23 y.o. [phone] admitted for 6-7cm left TOA and POD#1 s/p IR drain attempt  History complicated by: Patient Active Problem List   Diagnosis Date Noted   Positive RPR test 12/30/2023   History of sexually transmitted disease 12/30/2023   Tubo-ovarian abscess 12/28/2023   TOA (tubo-ovarian abscess) 12/28/2023   Abnormal chest CT 12/28/2023    ROS and patient/family/surgical history, located on admission H&P note dated 12/27/2023, have been reviewed, and there are no changes except as noted below Yesterday/Overnight Events:  IR drain attempt made but not able to due to obstructing blood vessels. Size and location looked the same.   Subjective:  Patient with persistent malaise and nausea. Patient still endorsing low belly, midline pain and also endorsing some new upper belly pain at the epigastrium area.   Objective:    Current Vital Signs 24h Vital Sign Ranges  T 98.6 F (37 C) Temp  Avg: 98.5 F (36.9 C)  Min: 98.3 F (36.8 C)  Max: 98.6 F (37 C)  BP 111/67 BP  Min: 96/60  Max: 122/80  HR 68 Pulse  Avg: 69.3  Min: 67  Max: 74  RR 16 Resp  Avg: 18  Min: 16  Max: 20  SaO2 99 % Room Air SpO2  Avg: 99 %  Min: 98 %  Max: 100 %       24 Hour I/O Current Shift I/O  Time Ins Outs 04/02 0701 - 04/03 0700 In: 646 [P.O.:240; I.V.:106] Out: -  No intake/output data recorded.   Patient Vitals for the past 24 hrs:  BP Temp Temp src Pulse Resp SpO2  12/31/23 0905 111/67 98.6 F (37 C) Oral 68 16 99 %  12/31/23 0519 96/60 98.6 F (37 C) Oral 68 -- 99 %  12/30/23 2011 105/71 98.5 F (36.9 C) Oral 67 20 100 %  12/30/23 1640 122/80 98.3 F (36.8 C) Oral 74 18 98 %    Physical exam: General appearance:  laying in bed on side, lethargic Abdomen:  mildly ttp in mid low pelvis and epigastric area, no peritoneal s/s GU: No gross VB Back: no CVAT Lungs: clear to auscultation  bilaterally Heart: S1, S2 normal, no murmur, rub or gallop, regular rate and rhythm Extremities: no lower extremity edema Skin: feels warm Psych: appropriate Neurologic: Grossly normal  Medications Current Facility-Administered Medications  Medication Dose Route Frequency Provider Last Rate Last Admin   cefTRIAXone (ROCEPHIN) 1 g in sodium chloride 0.9 % 100 mL IVPB  1 g Intravenous Q24H [place] Bing, MD 200 mL/hr at 12/30/23 1648 1 g at 12/30/23 1648   dextrose 5 % in lactated ringers infusion   Intravenous Continuous [place] Bing, MD       doxycycline (VIBRA-TABS) tablet 100 mg  100 mg Oral Q12H [place] Bing, MD   100 mg at 12/30/23 2020   ibuprofen (ADVIL) tablet 600 mg  600 mg Oral Q6H PRN [place] Bing, MD       metroNIDAZOLE (FLAGYL) tablet 500 mg  500 mg Oral Q12H [place] Bing, MD   500 mg at 12/30/23 2020   ondansetron (ZOFRAN) tablet 4 mg  4 mg Oral Q6H PRN [place] Bing, MD   4 mg at 12/28/23 6073   Or   ondansetron (ZOFRAN) injection 4 mg  4 mg Intravenous Q6H PRN [place] Bing, MD   4 mg at 12/30/23 1828   oxyCODONE-acetaminophen (PERCOCET/ROXICET) 5-325 MG per  tablet 1-2 tablet  1-2 tablet Oral Q6H PRN [place] Bing, MD   2 tablet at 12/31/23 0814   polyethylene glycol (MIRALAX / GLYCOLAX) packet 17 g  17 g Oral Daily [place] Bing, MD   17 g at 12/29/23 0949   sodium chloride flush (NS) 0.9 % injection 3 mL  3 mL Intravenous Q12H [place] Bing, MD   3 mL at 12/30/23 2022   sodium chloride flush (NS) 0.9 % injection 3 mL  3 mL Intravenous PRN [place] Bing, MD       Labs  Pending: legionella urin ag. T. Pallidum Negative: GC/CT, wet prep  Recent Labs  Lab 12/29/23 0614 12/30/23 0607 12/31/23 0641  WBC 2.9* 2.6* 2.7*  HGB 10.5* 10.4* 10.6*  HCT 33.1* 33.1* 32.8*  PLT 158 192 176    Recent Labs  Lab 12/27/23 1949 12/28/23 0754  NA 135  --   K 3.6  --   CL 104  --   CO2 24  --   BUN 10  --   CREATININE 0.67 0.78  CALCIUM  9.1  --   PROT 7.3  --   BILITOT 0.2  --   ALKPHOS 41  --   ALT 10  --   AST 13*  --   GLUCOSE 77  --    Radiology Narrative & Impression  CLINICAL DATA:  Abnormal x-ray.  Left lower lobe opacities.   EXAM: CT CHEST WITH CONTRAST   TECHNIQUE: Multidetector CT imaging of the chest was performed during intravenous contrast administration.   RADIATION DOSE REDUCTION: This exam was performed according to the departmental dose-optimization program which includes automated exposure control, adjustment of the mA and/or kV according to patient size and/or use of iterative reconstruction technique.   CONTRAST:  60mL OMNIPAQUE IOHEXOL 350 MG/ML SOLN   COMPARISON:  01/27/2021.  Chest x-ray 12/28/2023   FINDINGS: Cardiovascular: Heart is normal size. Aorta is normal caliber. No filling defects in the pulmonary arteries to suggest pulmonary emboli.   Mediastinum/Nodes: No mediastinal, hilar, or axillary adenopathy. Trachea and esophagus are unremarkable. Thyroid unremarkable.   Lungs/Pleura: Patchy opacities in the left lower lobe compatible with early pneumonia. Right lung clear. No effusions.   Upper Abdomen: No acute findings   Musculoskeletal: Chest wall soft tissues are unremarkable. No acute bony abnormality   IMPRESSION: Patchy airspace opacity anteriorly in the left lower lobe at the left lung base compatible with early pneumonia.     Electronically Signed   By: Charlett Nose M.D.   On: 12/30/2023 20:04     Narrative & Impression  CLINICAL DATA:  23 year old female presents for possible drainage of tubo-ovarian abscess   EXAM: CT PELVIS WITHOUT CONTRAST   TECHNIQUE: Multidetector CT imaging of the pelvis was performed following the standard protocol without intravenous contrast.   RADIATION DOSE REDUCTION: This exam was performed according to the departmental dose-optimization program which includes automated exposure control, adjustment of the mA and/or  kV according to patient size and/or use of iterative reconstruction technique.   COMPARISON:  12/27/2023   FINDINGS: CT performed for preoperative planning for attempted percutaneous drainage.   Scout image demonstrates the pelvic tubo-ovarian abscess, similar location and size to the comparison contrast CT.   There is no safe percutaneous window for a transgluteal or anterior approach. Drainage was deferred at this time.   IMPRESSION: Limited CT for attempted percutaneous drainage of tubo-ovarian abscess demonstrates no safe window for access. Drainage was deferred.     Electronically  Signed   By: Gilmer Mor D.O.   On: 12/31/2023 07:56    Assessment & Plan:  Patient stable *GYN: Long d/w pt and mom re: clinical course. I told them that surgery is reserved, if possible, as the last intervention and attempts with IV abx, IR  are made first, as surgery has a high risk of complications. I told them I recommend an ID consult. Will continue rocephin (s/p cefoxitin x 2 days, changed for ease of dosing)/doxy/flagyl.  I also ordered blood cultures and urine cultures for her and will consult ID.  *Respiratory: see chest CT. Patient has normal housing, and denies any h/o syphilis or working in healthcare or h/o lung infections. She does state that she works at [employer] and she has to wear a respiratory; ingredients list uploaded to media tab. Will have ID weigh in on this and may need pulmonary consult *+RPR: see above. F/u pending t.pal *Pain: continue with PO PRNs *FEN/GI: Regular diet. Will start MIVF since not taking much PO *PPx: restart lovenox this morning. SCDs *Dispo: inpatient for now  Code Status: Full Code  Total time taking care of the patient was 35 minutes, with greater than 50% of the time spent in face to face interaction with the patient.  Cornelia Copa MD Attending Center for [facility] (Faculty Practice) GYN Consult Phone:  [phone] (M-F, 0800-1700) & [phone] (Off hours, weekends, holidays)

## 2023-12-31 NOTE — Consult Note (Incomplete)
 [facility]                                                                                        Patient Identification: Patient Name: Laura [name] MRN: [id number] Admit Date: 12/27/2023  9:18 PM Today's Date: 12/31/2023 Reason for consult:  Requesting provider:   Principal Problem:   Tubo-ovarian abscess Active Problems:   TOA (tubo-ovarian abscess)   Abnormal chest CT   Positive RPR test   History of sexually transmitted disease   Antibiotics:   Lines/Hardware:  Assessment   Recommendations      Rest of the management as per the primary team. Please call with questions or concerns.  Thank you for the consult  __________________________________________________________________________________________________________ HPI and Hospital Course:    ROS: General- Denies fever, chills, loss of appetite and loss of weight HEENT - Denies headache, blurry vision, neck pain, sinus pain Chest - Denies any chest pain, SOB or cough CVS- Denies any dizziness/lightheadedness, syncopal attacks, palpitations Abdomen- Denies any nausea, vomiting, abdominal pain, hematochezia and diarrhea Neuro - Denies any weakness, numbness, tingling sensation Psych - Denies any changes in mood irritability or depressive symptoms GU- Denies any burning, dysuria, hematuria or increased frequency of urination Skin - denies any rashes/lesions MSK - denies any joint pain/swelling or restricted ROM    PMHx: PSHx:  Scheduled Meds:  polyethylene glycol  17 g Oral Daily   sodium chloride flush  3 mL Intravenous Q12H   Continuous Infusions:  cefTRIAXone (ROCEPHIN)  IV 1 g (12/31/23 1734)   dextrose 5% lactated ringers 100 mL/hr at 12/31/23 2008   doxycycline (VIBRAMYCIN) IV     metronidazole     PRN Meds:.bisacodyl, ibuprofen, ondansetron **OR** ondansetron (ZOFRAN) IV, oxyCODONE-acetaminophen,  sodium chloride flush   Allergies: SocHx: FamHx:     Vitals    Physical Exam Constitutional:      Comments:   Cardiovascular:     Rate and Rhythm: Normal rate and regular rhythm.     Heart sounds: No murmur heard.   Pulmonary:     Effort: Pulmonary effort is normal.     Comments:   Abdominal:     Palpations: Abdomen is soft.     Tenderness:   Musculoskeletal:        General: No swelling or tenderness.   Skin:    Comments:   Neurological:     General: No focal deficit present.   Psychiatric:        Mood and Affect: Mood normal.    Pertinent Microbiology -   Pertinent Lab seen by me: -  Pertinent Imagings/Other Imagings Plain films and CT images have been personally visualized and interpreted; radiology reports have been reviewed. Decision making incorporated into the Impression / Recommendations.   *** This is a remote tele-consult and recommendations are based on chart review only as patient not seen in person.***  I have personally spent 85 minutes involved in face-to-face and non-face-to-face activities for this patient on the day of the visit. Professional time spent includes the following activities: Preparing to see the patient (review of tests), Obtaining and/or reviewing separately obtained history (admission/discharge  record), Performing a medically appropriate examination and/or evaluation , Ordering medications/tests/procedures, referring and communicating with other health care professionals, Documenting clinical information in the EMR, Independently interpreting results (not separately reported), Communicating results to the patient/family/caregiver, Counseling and educating the patient/family/caregiver and Care coordination (not separately reported).  Electronically signed by:   Plan d/w requesting provider as well as ID pharm D  Of note, portions of this note may have been created with voice recognition software. While this note has been  edited for accuracy, occasional wrong-word or 'sound-a-like' substitutions may have occurred due to the inherent limitations of voice recognition software.   Odette Fraction, MD Infectious Disease Physician [facility] Pager: [phone]

## 2023-12-31 NOTE — Plan of Care (Signed)
  Problem: Pain Managment: Goal: General experience of comfort will improve and/or be controlled Outcome: Progressing   Problem: Safety: Goal: Ability to remain free from injury will improve Outcome: Progressing

## 2023-12-31 NOTE — Plan of Care (Signed)

## 2024-01-01 LAB — STREP PNEUMONIAE URINARY ANTIGEN: Strep Pneumo Urinary Antigen: NEGATIVE

## 2024-01-01 LAB — CBC
HCT: 32.7 % — ABNORMAL LOW (ref 36.0–46.0)
Hemoglobin: 10.3 g/dL — ABNORMAL LOW (ref 12.0–15.0)
MCH: 22.5 pg — ABNORMAL LOW (ref 26.0–34.0)
MCHC: 31.5 g/dL (ref 30.0–36.0)
MCV: 71.6 fL — ABNORMAL LOW (ref 80.0–100.0)
Platelets: 204 10*3/uL (ref 150–400)
RBC: 4.57 MIL/uL (ref 3.87–5.11)
RDW: 13.2 % (ref 11.5–15.5)
WBC: 3 10*3/uL — ABNORMAL LOW (ref 4.0–10.5)
nRBC: 0 % (ref 0.0–0.2)

## 2024-01-01 LAB — CULTURE, OB URINE
Culture: NO GROWTH
Special Requests: NORMAL

## 2024-01-01 LAB — T.PALLIDUM AB, TOTAL: T Pallidum Abs: NONREACTIVE

## 2024-01-01 MED ORDER — PROCHLORPERAZINE EDISYLATE 10 MG/2ML IJ SOLN
10.0000 mg | Freq: Four times a day (QID) | INTRAMUSCULAR | Status: DC | PRN
  Administered 2024-01-01: 10 mg via INTRAVENOUS
  Filled 2024-01-01: qty 2

## 2024-01-01 MED ORDER — ACETAMINOPHEN 325 MG PO TABS: 650.0000 mg | ORAL_TABLET | Freq: Four times a day (QID) | ORAL | Status: DC | PRN

## 2024-01-01 MED ORDER — OXYCODONE HCL 5 MG PO TABS
5.0000 mg | ORAL_TABLET | ORAL | Status: DC | PRN
  Administered 2024-01-01: 10 mg via ORAL
  Filled 2024-01-01: qty 2

## 2024-01-01 NOTE — Plan of Care (Signed)
  Problem: Clinical Measurements: Goal: Will remain free from infection Outcome: Progressing Goal: Cardiovascular complication will be avoided Outcome: Progressing   Problem: Nutrition: Goal: Adequate nutrition will be maintained Outcome: Progressing   Problem: Elimination: Goal: Will not experience complications related to urinary retention Outcome: Progressing   Problem: Safety: Goal: Ability to remain free from injury will improve Outcome: Progressing   Problem: Skin Integrity: Goal: Risk for impaired skin integrity will decrease Outcome: Progressing

## 2024-01-01 NOTE — Progress Notes (Signed)
 GYN Note I spoke to Dr. Jesse Sans of pulmonary and given the very faint stranding and overall looks unimpressive and would ignore since no s/s. Can follow up re: this as an outpatient  Laura [name], [name] MD Attending Center for [employer] (Faculty Practice) 01/01/2024

## 2024-01-01 NOTE — Consult Note (Addendum)
 I have seen and examined the patient. I have personally reviewed the clinical findings, laboratory findings, microbiological data and imaging studies. The assessment and treatment plan was discussed with the Nurse Practitioner. I agree with her/his recommendations except following additions/corrections.  23 year old female [id number] with prior history of anemia, asthma, anxiety/depression, genital herpes who presented to the ED with lower abdominal pain for several days.  She has a history of constipation and was taking laxatives without any help.  LMP was approximately 2 weeks ago during admit.  No reported fever, chills, vomiting or diarrhea, chest pain or shortness of breath, GU symptoms or vaginal discharge. Denies being sexually active recently.    At ED, afebrile,  right upper quadrant as well as bilateral lower quadrant tenderness. Labs remarkable for unremarkable CMP as well as BMP.  Negative urine pregnancy test, negative wet prep Influenza A/influenza B/RSV/SARS-CoV-2 negative Hepatitis B surface antigen nonreactive, HIV nonreactive RPR reactive, titer 1 is to 1 Blood and urine culture pending   Imaging concerning for left-sided salpingitis/pyosalpinx, possible TOA as well as opacities in the left lower lobe concerning for pneumonia. No symptoms or signs to suggest PNA however.    Unable for percutaneous drainage due to no safe window for IR   Initially on cefoxitin, doxycycline and metronidazole>> ceftriaxone, doxycycline and metronidazole  Exam - adult female lying in the bed, non toxic appearing, HEENT wnl, Heart, lung wnl, abdomen- mild tenderness in the b/l lower quadrants, no RT, rigidity, MSK no pedal edema, no rashes. Awake, alert and oriented, grossly non focal.   Continue doxy, ceftriaxone and metronidazole ( all IV) Monitor CBC and CMP Fu T pallidum abs to see if needs tx Monitor for improvement, may need to repeat CT in few days.  Universal/standard isolation  precautions    I have personally spent 80 minutes involved in face-to-face and non-face-to-face activities for this patient on the day of the visit. Professional time spent includes the following activities: Preparing to see the patient (review of tests), Obtaining and/or reviewing separately obtained history (admission/discharge record), Performing a medically appropriate examination and/or evaluation , Ordering medications/tests/procedures, referring and communicating with other health care professionals, Documenting clinical information in the EMR, Independently interpreting results (not separately reported), Communicating results to the patient/family/caregiver, Counseling and educating the patient/family/caregiver and Care coordination (not separately reported).      [facility]    Date of Admission:  12/27/2023     Total days of antibiotics 6               Reason for Consult: Tubo-ovarian Abscess   Referring Provider: Dr. Vergie Living  Primary Care Provider: [facility]   ASSESSMENT:  Laura [name] is a 23 y/o AA female admitted with symptoms of constipation and abdominal pain and found to have tubo-ovarian abscess. IR consult for percutaneous drain placement with no safe windows to access site. GYN reserving surgery as last option secondary to increased risk for complications. Blood cultures drawn and pending and would suspect low chance for growth having been on antibiotics. Agree with broad spectrum coverage in the absence of organisms and continue current dose of doxycycline, ceftriaxone and metronidazole. Monitor blood cultures for any organisms. Recommend re-imaging in the next 2-5 days to determine any improvement. Positive RPR test with treponemal confirmation pending. Last RPR non-reactive July 2024. Universal/standard precautions. Remaining medical and supportive care per Gynecology.    PLAN:  Continue current dose of doxycycline, ceftriaxone  and metronidazole.  Monitor cultures for  bacteremia.  Re-image in the next few days for worsening/improvements. Await treponemal confirmation for potential syphilis.  Universal/standard precautions.  Remaining medical and supportive care per Gynecology.    Principal Problem:   Tubo-ovarian abscess Active Problems:   TOA (tubo-ovarian abscess)   Abnormal chest CT   Positive RPR test   History of sexually transmitted disease    polyethylene glycol  17 g Oral Daily   sodium chloride flush  3 mL Intravenous Q12H     HPI: Laura [name] is a 23 y.o. female with previous medical history of depression, anxiety, and ovarian cyst presenting to the hospital severe constipation and abdominal pain.  Laura [name] presented with 3 day history of constipation and abdominal pain feeling "a pulse" in rectum. Afebrile and no leukocytosis. CT abdomen/pelvis with dilated tubular structure in the left pelvis suspicious for dilated Fallopian tube; and adjacent free fluid in the pelvis with suspicion for salpingitis/pyosalpinx. Chest x-ray with persistent airspace disease suspicious for pneumonia. IR evaluated for drainage with no safe window for access. Initially started on cefoxitin and transitioned to ceftriaxone, doxycycline and metronidazole. Wet prep with no significant findings. Blood cultures obtained on 12/31/23 without growth to date. Gynecology reserving surgery as the last intervention as there are a high risk of complication. ID consulted for antibiotic and treatment recommendations.     Review of Systems: Review of Systems  Constitutional:  Negative for chills, fever and weight loss.  Respiratory:  Negative for cough, shortness of breath and wheezing.   Cardiovascular:  Negative for chest pain and leg swelling.  Gastrointestinal:  Positive for abdominal pain and constipation. Negative for diarrhea, nausea and vomiting.  Skin:  Negative for rash.     Past Medical History:  Diagnosis Date    Anemia    Anxiety    Asthma    Chlamydia    Depression    Genital herpes    Gonorrhea    Ovarian cyst    Past Surgical History:  Procedure Laterality Date   OVARIAN CYST REMOVAL      Social History   Tobacco Use   Smoking status: Former    Current packs/day: 0.00    Types: Cigarettes, Cigars    Quit date: 10/07/2018    Years since quitting: 5.2   Smokeless tobacco: Never  Vaping Use   Vaping status: Some Days  Substance Use Topics   Alcohol use: Yes   Drug use: No    Family History  Problem Relation Age of Onset   Epilepsy Father     No Known Allergies  OBJECTIVE: Blood pressure 105/67, pulse 66, temperature 97.9 F (36.6 C), temperature source Oral, resp. rate 16, height 5\' 3"  (1.6 m), weight 55.8 kg, SpO2 99%.  Physical Exam Constitutional:      General: She is not in acute distress.    Appearance: She is well-developed.  Cardiovascular:     Rate and Rhythm: Normal rate and regular rhythm.     Heart sounds: Normal heart sounds.  Pulmonary:     Effort: Pulmonary effort is normal.     Breath sounds: Normal breath sounds.  Abdominal:     General: Bowel sounds are normal.     Tenderness: There is abdominal tenderness in the right lower quadrant, suprapubic area and left lower quadrant.  Skin:    General: Skin is warm and dry.  Neurological:     Mental Status: She is alert and oriented to person, place, and time.  Psychiatric:  Behavior: Behavior normal.        Thought Content: Thought content normal.        Judgment: Judgment normal.     Lab Results Lab Results  Component Value Date   WBC 3.0 (L) 01/01/2024   HGB 10.3 (L) 01/01/2024   HCT 32.7 (L) 01/01/2024   MCV 71.6 (L) 01/01/2024   PLT 204 01/01/2024    Lab Results  Component Value Date   CREATININE 0.78 12/28/2023   BUN 10 12/27/2023   NA 135 12/27/2023   K 3.6 12/27/2023   CL 104 12/27/2023   CO2 24 12/27/2023    Lab Results  Component Value Date   ALT 10 12/27/2023   AST  13 (L) 12/27/2023   ALKPHOS 41 12/27/2023   BILITOT 0.2 12/27/2023     Microbiology: Recent Results (from the past 240 hours)  Wet prep, genital     Status: None   Collection Time: 12/27/23 11:45 PM   Specimen: PATH Cytology Cervicovaginal Ancillary Only  Result Value Ref Range Status   Yeast Wet Prep HPF POC NONE SEEN NONE SEEN Final   Trich, Wet Prep NONE SEEN NONE SEEN Final   Clue Cells Wet Prep HPF POC NONE SEEN NONE SEEN Final   WBC, Wet Prep HPF POC <10 <10 Final   Sperm NONE SEEN  Final    Comment: Performed at [facility], [address] [place], [place] [address]  Resp panel by RT-PCR (RSV, Flu A&B, Covid) Anterior Nasal Swab     Status: None   Collection Time: 12/28/23 11:24 AM   Specimen: Anterior Nasal Swab  Result Value Ref Range Status   SARS Coronavirus 2 by RT PCR NEGATIVE NEGATIVE Final   Influenza A by PCR NEGATIVE NEGATIVE Final   Influenza B by PCR NEGATIVE NEGATIVE Final    Comment: (NOTE) The Xpert Xpress SARS-CoV-2/FLU/RSV plus assay is intended as an aid in the diagnosis of influenza from Nasopharyngeal swab specimens and should not be used as a sole basis for treatment. Nasal washings and aspirates are unacceptable for Xpert Xpress SARS-CoV-2/FLU/RSV testing.  Fact Sheet for Patients: BloggerCourse.com  Fact Sheet for Healthcare Providers: SeriousBroker.it  This test is not yet approved or cleared by the [place] FDA and has been authorized for detection and/or diagnosis of SARS-CoV-2 by FDA under an Emergency Use Authorization (EUA). This EUA will remain in effect (meaning this test can be used) for the duration of the COVID-19 declaration under Section 564(b)(1) of the Act, 21 U.S.C. section 360bbb-3(b)(1), unless the authorization is terminated or revoked.     Resp Syncytial Virus by PCR NEGATIVE NEGATIVE Final    Comment: (NOTE) Fact Sheet for  Patients: BloggerCourse.com  Fact Sheet for Healthcare Providers: SeriousBroker.it  This test is not yet approved or cleared by the [place] FDA and has been authorized for detection and/or diagnosis of SARS-CoV-2 by FDA under an Emergency Use Authorization (EUA). This EUA will remain in effect (meaning this test can be used) for the duration of the COVID-19 declaration under Section 564(b)(1) of the Act, 21 U.S.C. section 360bbb-3(b)(1), unless the authorization is terminated or revoked.  Performed at [facility], [address] [place], [place] [address]    Imaging CT PELVIS LIMITED WO CONTRAST Result Date: 12/31/2023 CLINICAL DATA:  23 year old female presents for possible drainage of tubo-ovarian abscess EXAM: CT PELVIS WITHOUT CONTRAST TECHNIQUE: Multidetector CT imaging of the pelvis was performed following the standard protocol without intravenous contrast. RADIATION DOSE  REDUCTION: This exam was performed according to the departmental dose-optimization program which includes automated exposure control, adjustment of the mA and/or kV according to patient size and/or use of iterative reconstruction technique. COMPARISON:  12/27/2023 FINDINGS: CT performed for preoperative planning for attempted percutaneous drainage. Scout image demonstrates the pelvic tubo-ovarian abscess, similar location and size to the comparison contrast CT. There is no safe percutaneous window for a transgluteal or anterior approach. Drainage was deferred at this time. IMPRESSION: Limited CT for attempted percutaneous drainage of tubo-ovarian abscess demonstrates no safe window for access. Drainage was deferred. Electronically Signed   By: Gilmer Mor D.O.   On: 12/31/2023 07:56   CT CHEST W CONTRAST Result Date: 12/30/2023 CLINICAL DATA:  Abnormal x-ray.  Left lower lobe opacities. EXAM: CT CHEST WITH CONTRAST TECHNIQUE: Multidetector CT imaging of the  chest was performed during intravenous contrast administration. RADIATION DOSE REDUCTION: This exam was performed according to the departmental dose-optimization program which includes automated exposure control, adjustment of the mA and/or kV according to patient size and/or use of iterative reconstruction technique. CONTRAST:  60mL OMNIPAQUE IOHEXOL 350 MG/ML SOLN COMPARISON:  01/27/2021.  Chest x-ray 12/28/2023 FINDINGS: Cardiovascular: Heart is normal size. Aorta is normal caliber. No filling defects in the pulmonary arteries to suggest pulmonary emboli. Mediastinum/Nodes: No mediastinal, hilar, or axillary adenopathy. Trachea and esophagus are unremarkable. Thyroid unremarkable. Lungs/Pleura: Patchy opacities in the left lower lobe compatible with early pneumonia. Right lung clear. No effusions. Upper Abdomen: No acute findings Musculoskeletal: Chest wall soft tissues are unremarkable. No acute bony abnormality IMPRESSION: Patchy airspace opacity anteriorly in the left lower lobe at the left lung base compatible with early pneumonia. Electronically Signed   By: Charlett Nose M.D.   On: 12/30/2023 20:04   DG Chest 2 View Result Date: 12/28/2023 CLINICAL DATA:  Follow up abnormal CT.  Two ovarian abscess. EXAM: CHEST - 2 VIEW COMPARISON:  Chest radiographs 09/27/2023 and 08/27/2023. Abdominal CT 12/27/2023. FINDINGS: The heart size and mediastinal contours are stable. As seen on recent abdominal CT, there is focal airspace disease anteriorly in the left lower lobe suspicious for a small infiltrate. The lungs otherwise appear clear. There is no pleural effusion or pneumothorax. The bones appear unremarkable. Telemetry leads overlie the chest. IMPRESSION: Persistent focal airspace disease anteriorly in the left lower lobe suspicious for pneumonia. No other evidence of acute cardiopulmonary process. Electronically Signed   By: Carey Bullocks M.D.   On: 12/28/2023 11:07   CT ABDOMEN PELVIS W CONTRAST Result  Date: 12/27/2023 CLINICAL DATA:  Left lower quadrant abdominal pain and right lower quadrant abdominal pain EXAM: CT ABDOMEN AND PELVIS WITH CONTRAST TECHNIQUE: Multidetector CT imaging of the abdomen and pelvis was performed using the standard protocol following bolus administration of intravenous contrast. RADIATION DOSE REDUCTION: This exam was performed according to the departmental dose-optimization program which includes automated exposure control, adjustment of the mA and/or kV according to patient size and/or use of iterative reconstruction technique. CONTRAST:  OMNIPAQUE IOHEXOL 300 MG/ML  SOLN COMPARISON:  CT abdomen pelvis 06/13/2019 FINDINGS: Lower chest: Airspace opacities in the anterior left lower lobe suspicious for pneumonia. Hepatobiliary: Unremarkable liver. Normal gallbladder. No biliary dilation. Pancreas: Unremarkable. Spleen: Unremarkable. Adrenals/Urinary Tract: Normal adrenal glands. No urinary calculi or hydronephrosis. Bladder is unremarkable. Stomach/Bowel: Normal caliber large and small bowel. No bowel wall thickening. The appendix is normal.Stomach is within normal limits. Vascular/Lymphatic: No significant vascular findings are present. No enlarged abdominal or pelvic lymph nodes. Reproductive: Dilated tubular structure  in the left pelvis measuring 6.5 x 2.9 cm suspicious for dilated fallopian tube. Adjacent free fluid in the pelvis. Uterus and right adnexa are unremarkable. Other: No free intraperitoneal fluid or air. Musculoskeletal: No acute fracture. IMPRESSION: 1. Dilated tubular structure in the left pelvis measuring 6.5 x 2.9 cm suspicious for dilated Fallopian tube. Adjacent free fluid in the pelvis. Findings are suspicious for left-sided salpingitis/pyosalpinx. Recommend further evaluation with pelvic ultrasound. 2. Airspace opacities in the anterior left lower lobe suspicious for pneumonia. Electronically Signed   By: Minerva Fester M.D.   On: 12/27/2023 23:17    I  have personally spent 30 minutes involved in face-to-face and non-face-to-face activities for this patient on the day of the visit. Professional time spent includes the following activities: Preparing to see the patient (review of tests), Obtaining and/or reviewing separately obtained history (admission/discharge record), Performing a medically appropriate examination and/or evaluation , Ordering medications/tests/procedures, referring and communicating with other health care professionals, Documenting clinical information in the EMR, Independently interpreting results (not separately reported), Communicating results to the patient/family/caregiver, Counseling and educating the patient/family/caregiver and Care coordination (not separately reported).    Marcos Eke, NP [facility]  01/01/2024  8:25 AM

## 2024-01-01 NOTE — Plan of Care (Signed)
  Problem: Pain Managment: Goal: General experience of comfort will improve and/or be controlled Outcome: Progressing   Problem: Safety: Goal: Ability to remain free from injury will improve Outcome: Progressing

## 2024-01-01 NOTE — Progress Notes (Signed)
 Gynecology Progress Note  Admission Date: 12/27/2023 Current Date: 01/01/2024 12:05 PM  Laura [name] is a 23 y.o. [phone] admitted for 6-7cm left TOA and POD#1 s/p IR drain attempt  History complicated by: Patient Active Problem List   Diagnosis Date Noted   Positive RPR test 12/30/2023   History of sexually transmitted disease 12/30/2023   Tubo-ovarian abscess 12/28/2023   TOA (tubo-ovarian abscess) 12/28/2023   Abnormal chest CT 12/28/2023    ROS and patient/family/surgical history, located on admission H&P note dated 12/27/2023, have been reviewed, and there are no changes except as noted below Yesterday/Overnight Events:  Blood and urine cultures ordered Per ID, doxy and flagyl switched from PO to IV  Subjective:  Pain in low belly and upper abdominal stable with episodes of increased pain, especially at night. Patient taking some PO but still has nausea. +flatus, no BM  Objective:    Current Vital Signs 24h Vital Sign Ranges  T 97.9 F (36.6 C) Temp  Avg: 98.1 F (36.7 C)  Min: 97.6 F (36.4 C)  Max: 98.5 F (36.9 C)  BP 105/67 BP  Min: 105/67  Max: 125/80  HR 66 Pulse  Avg: 67.3  Min: 63  Max: 71  RR 16 Resp  Avg: 17  Min: 16  Max: 18  SaO2 99 % Room Air SpO2  Avg: 99.8 %  Min: 99 %  Max: 100 %       24 Hour I/O Current Shift I/O  Time Ins Outs 04/03 0701 - 04/04 0700 In: 2479 [P.O.:720; I.V.:1409] Out: -  No intake/output data recorded.   Patient Vitals for the past 24 hrs:  BP Temp Temp src Pulse Resp SpO2  01/01/24 0729 105/67 97.9 F (36.6 C) Oral 66 16 99 %  01/01/24 0424 111/77 98.5 F (36.9 C) Oral 69 18 100 %  12/31/23 2005 125/80 98.3 F (36.8 C) Oral 63 18 100 %  12/31/23 1425 109/79 97.6 F (36.4 C) Oral 71 16 100 %   Physical exam: General appearance: NAD Abdomen:  mildly ttp in mid low pelvis and epigastric area, no peritoneal s/s . +BS, mild to moderately distended.  GU: No gross VB Back: no CVAT Lungs: clear to auscultation  bilaterally Heart: S1, S2 normal, no murmur, rub or gallop, regular rate and rhythm Extremities: no lower extremity edema Skin: feels warm Psych: appropriate Neurologic: Grossly normal  Medications Current Facility-Administered Medications  Medication Dose Route Frequency Provider Last Rate Last Admin   cefTRIAXone (ROCEPHIN) 1 g in sodium chloride 0.9 % 100 mL IVPB  1 g Intravenous Q24H [place] Bing, MD   Stopped at 12/31/23 1804   doxycycline (VIBRAMYCIN) 100 mg in sodium chloride 0.9 % 250 mL IVPB  100 mg Intravenous Q12H [place] Bing, MD 125 mL/hr at 01/01/24 1002 100 mg at 01/01/24 1002   ibuprofen (ADVIL) tablet 600 mg  600 mg Oral Q6H PRN [place] Bing, MD   600 mg at 01/01/24 0816   metroNIDAZOLE (FLAGYL) IVPB 500 mg  500 mg Intravenous Q12H [place] Bing, MD 100 mL/hr at 01/01/24 0819 500 mg at 01/01/24 0819   ondansetron (ZOFRAN) tablet 4 mg  4 mg Oral Q6H PRN [place] Bing, MD   4 mg at 12/28/23 4696   Or   ondansetron (ZOFRAN) injection 4 mg  4 mg Intravenous Q6H PRN [place] Bing, MD   4 mg at 01/01/24 0405   oxyCODONE-acetaminophen (PERCOCET/ROXICET) 5-325 MG per tablet 1-2 tablet  1-2 tablet Oral Q6H PRN [place] Bing, MD  2 tablet at 01/01/24 1002   polyethylene glycol (MIRALAX / GLYCOLAX) packet 17 g  17 g Oral Daily [place] Bing, MD   17 g at 01/01/24 0816   sodium chloride flush (NS) 0.9 % injection 3 mL  3 mL Intravenous Q12H [place] Bing, MD   3 mL at 01/01/24 0819   sodium chloride flush (NS) 0.9 % injection 3 mL  3 mL Intravenous PRN [place] Bing, MD   3 mL at 12/31/23 0943   Labs  Pending: T. Pallidum, BCx NGTD, Ucx Negative: legionella urine ag.   Recent Labs  Lab 12/30/23 0607 12/31/23 0641 01/01/24 0337  WBC 2.6* 2.7* 3.0*  HGB 10.4* 10.6* 10.3*  HCT 33.1* 32.8* 32.7*  PLT 192 176 204    Recent Labs  Lab 12/27/23 1949 12/28/23 0754  NA 135  --   K 3.6  --   CL 104  --   CO2 24  --   BUN 10  --   CREATININE  0.67 0.78  CALCIUM 9.1  --   PROT 7.3  --   BILITOT 0.2  --   ALKPHOS 41  --   ALT 10  --   AST 13*  --   GLUCOSE 77  --    Radiology No new imaging  Assessment & Plan:  Patient stable *GYN: s/p formal ID consult this morning, appreciate recommendations and follow up signed note but continue with current plan and consider re-imaging in a few days if not significantly improving.  Will continue rocephin (s/p cefoxitin x 2 days, changed for ease of dosing)/doxy/flagyl.  I also ordered blood cultures and urine cultures for her and will consult ID.  *Respiratory: will touch base with pulmonary to see about potential next steps *+RPR: see above. F/u pending t.pal *Pain: continue with PO PRNs *FEN/GI: Regular diet. Will start MIVF since not taking much PO *PPx: restart lovenox this morning. SCDs *Dispo: inpatient for now  Code Status: Full Code  Total time taking care of the patient was 35 minutes, with greater than 50% of the time spent in face to face interaction with the patient.  Cornelia Copa MD Attending Center for [facility] (Faculty Practice) GYN Consult Phone: [phone] (M-F, 0800-1700) & [phone] (Off hours, weekends, holidays)

## 2024-01-01 NOTE — Progress Notes (Signed)
 [place] paged, patient c/o 7/10 pain. Pain med administered at 0400. Something for breakthrough requested.

## 2024-01-02 DIAGNOSIS — B9689 Other specified bacterial agents as the cause of diseases classified elsewhere: Secondary | ICD-10-CM | POA: Diagnosis not present

## 2024-01-02 DIAGNOSIS — N7093 Salpingitis and oophoritis, unspecified: Principal | ICD-10-CM

## 2024-01-02 LAB — CBC
HCT: 33.7 % — ABNORMAL LOW (ref 36.0–46.0)
Hemoglobin: 10.9 g/dL — ABNORMAL LOW (ref 12.0–15.0)
MCH: 22.9 pg — ABNORMAL LOW (ref 26.0–34.0)
MCHC: 32.3 g/dL (ref 30.0–36.0)
MCV: 70.8 fL — ABNORMAL LOW (ref 80.0–100.0)
Platelets: 197 10*3/uL (ref 150–400)
RBC: 4.76 MIL/uL (ref 3.87–5.11)
RDW: 13.1 % (ref 11.5–15.5)
WBC: 2.8 10*3/uL — ABNORMAL LOW (ref 4.0–10.5)
nRBC: 0 % (ref 0.0–0.2)

## 2024-01-02 NOTE — Plan of Care (Signed)
  Problem: Education: Goal: Knowledge of General Education information will improve Description: Including pain rating scale, medication(s)/side effects and non-pharmacologic comfort measures Outcome: Progressing   Problem: Clinical Measurements: Goal: Ability to maintain clinical measurements within normal limits will improve Outcome: Progressing Goal: Diagnostic test results will improve Outcome: Progressing Goal: Cardiovascular complication will be avoided Outcome: Progressing   Problem: Activity: Goal: Risk for activity intolerance will decrease Outcome: Progressing   Problem: Nutrition: Goal: Adequate nutrition will be maintained Outcome: Progressing   

## 2024-01-02 NOTE — Progress Notes (Incomplete)
    [facility]    Date of Admission:  12/27/2023   Total days of antibiotics 7   ID: Laura [name] is a 23 y.o. female with   Principal Problem:   Tubo-ovarian abscess Active Problems:   TOA (tubo-ovarian abscess)   Abnormal chest CT   Positive RPR test   History of sexually transmitted disease    Subjective: Feeling better. No pain with defecation. No fevers or nightsweats. Has not ambulated  Medications:   polyethylene glycol  17 g Oral Daily   sodium chloride flush  3 mL Intravenous Q12H    Objective: Vital signs in last 24 hours: Temp:  [98.1 F (36.7 C)-98.4 F (36.9 C)] 98.4 F (36.9 C) (04/05 0429) Pulse Rate:  [58-73] 63 (04/05 0429) Resp:  [16-18] 18 (04/05 0429) BP: (106-113)/(68-74) 113/68 (04/05 0429) SpO2:  [99 %] 99 % (04/05 0429)  Physical Exam  Constitutional:  oriented to person, place, and time. appears well-developed and well-nourished. No distress.  HENT: [place]/AT, PERRLA, no scleral icterus Mouth/Throat: Oropharynx is clear and moist. No oropharyngeal exudate.  Cardiovascular: Normal rate, regular rhythm and normal heart sounds. Exam reveals no gallop and no friction rub.  No murmur heard.  Pulmonary/Chest: Effort normal and breath sounds normal. No respiratory distress.  has no wheezes.  Neck = supple, no nuchal rigidity Abdominal: Soft. Bowel sounds are normal.  exhibits no distension. There is no tenderness.  Lymphadenopathy: no cervical adenopathy. No axillary adenopathy Neurological: alert and oriented to person, place, and time.  Skin: Skin is warm and dry. No rash noted. No erythema.  Psychiatric: a normal mood and affect.  behavior is normal.    Lab Results Recent Labs    01/01/24 0337 01/02/24 0550  WBC 3.0* 2.8*  HGB 10.3* 10.9*  HCT 32.7* 33.7*   Microbiology: reviewed Studies/Results: 3/30 IMPRESSION: 1. Dilated tubular structure in the left pelvis measuring 6.5 x 2.9 cm suspicious for dilated  Fallopian tube. Adjacent free fluid in the pelvis. Findings are suspicious for left-sided salpingitis/pyosalpinx. Recommend further evaluation with pelvic ultrasound. 2. Airspace opacities in the anterior left lower lobe suspicious for pneumonia.  Assessment/Plan: Continue on ceftriaxone, metronidazole plus doxy. We will change doxy to oral to see how she tolerates and will pre medicate with phenergan.  Agree with plan to get repeat CT to see size of TOA  Will check sed rate and crp  Anticipate to try to change to oral regimen by Monday  I have personally spent 25 minutes involved in face-to-face and non-face-to-face activities for this patient on the day of the visit. Professional time spent includes the following activities: Preparing to see the patient (review of tests), Obtaining and/or reviewing separately obtained history (admission/discharge record), Performing a medically appropriate examination and/or evaluation , Ordering medications/tests/procedures, referring and communicating with other health care professionals, Documenting clinical information in the EMR, Independently interpreting results (not separately reported), Communicating results to the patient/family/caregiver, Counseling and educating the patient/family/caregiver and Care coordination (not separately reported).     [facility] Pager: [phone]  01/02/2024, 10:05 AM

## 2024-01-02 NOTE — Progress Notes (Signed)
 Gynecology Progress Note  Admission Date: 12/27/2023 Current Date: 01/02/2024 10:43 AM  Laura [name] is a 23 y.o. [phone] admitted for 6-7cm left TOA vs hydrosalpinx and POD#1 s/p IR drain attempt  History complicated by: Patient Active Problem List   Diagnosis Date Noted   Positive RPR test 12/30/2023   History of sexually transmitted disease 12/30/2023   Tubo-ovarian abscess 12/28/2023   TOA (tubo-ovarian abscess) 12/28/2023   Abnormal chest CT 12/28/2023    ROS and patient/family/surgical history, located on admission H&P note dated 12/27/2023, have been reviewed, and there are no changes except as noted below Yesterday/Overnight Events:  None  Subjective:  Pain much improved today. Some nausea, but controlled. Not yet ambulating halls and encouraged her to walk. +flatus  Objective:    Current Vital Signs 24h Vital Sign Ranges  T 98.4 F (36.9 C) Temp  Avg: 98.3 F (36.8 C)  Min: 98.1 F (36.7 C)  Max: 98.4 F (36.9 C)  BP 113/68 BP  Min: 106/74  Max: 113/68  HR 63 Pulse  Avg: 64.7  Min: 58  Max: 73  RR 18 Resp  Avg: 16.7  Min: 16  Max: 18  SaO2 99 % Room Air SpO2  Avg: 99 %  Min: 99 %  Max: 99 %       24 Hour I/O Current Shift I/O  Time Ins Outs 04/04 0701 - 04/05 0700 In: 1070 [P.O.:120] Out: 0  No intake/output data recorded.   Patient Vitals for the past 24 hrs:  BP Temp Temp src Pulse Resp SpO2  01/02/24 0429 113/68 98.4 F (36.9 C) Oral 63 18 99 %  01/01/24 2004 106/74 98.1 F (36.7 C) Oral (!) 58 16 99 %  01/01/24 1540 107/69 98.4 F (36.9 C) Oral 73 16 99 %   Physical exam: General appearance: NAD Abdomen:  mildly ttp in mid low pelvis and epigastric area, no peritoneal s/s . +BS, mild to moderately distended.  GU: No gross VB Back: no CVAT Lungs: clear to auscultation bilaterally Heart: S1, S2 normal, no murmur, rub or gallop, regular rate and rhythm Extremities: no lower extremity edema Skin: feels warm Psych: appropriate Neurologic: Grossly  normal  Medications Current Facility-Administered Medications  Medication Dose Route Frequency Provider Last Rate Last Admin   acetaminophen (TYLENOL) tablet 650 mg  650 mg Oral Q6H PRN [place] Bing, MD       cefTRIAXone (ROCEPHIN) 1 g in sodium chloride 0.9 % 100 mL IVPB  1 g Intravenous Q24H [place] Bing, MD 200 mL/hr at 01/01/24 1745 1 g at 01/01/24 1745   doxycycline (VIBRAMYCIN) 100 mg in sodium chloride 0.9 % 250 mL IVPB  100 mg Intravenous Q12H [place] Bing, MD 125 mL/hr at 01/02/24 0956 100 mg at 01/02/24 0956   ibuprofen (ADVIL) tablet 600 mg  600 mg Oral Q6H PRN [place] Bing, MD   600 mg at 01/01/24 0816   metroNIDAZOLE (FLAGYL) IVPB 500 mg  500 mg Intravenous Q12H [place] Bing, MD 100 mL/hr at 01/02/24 0959 500 mg at 01/02/24 0959   ondansetron (ZOFRAN) tablet 4 mg  4 mg Oral Q6H PRN [place] Bing, MD   4 mg at 01/01/24 1459   Or   ondansetron (ZOFRAN) injection 4 mg  4 mg Intravenous Q6H PRN [place] Bing, MD   4 mg at 01/01/24 0405   oxyCODONE (Oxy IR/ROXICODONE) immediate release tablet 5-10 mg  5-10 mg Oral Q4H PRN [place] Bing, MD   10 mg at 01/01/24 1455   polyethylene  glycol (MIRALAX / GLYCOLAX) packet 17 g  17 g Oral Daily [place] Bing, MD   17 g at 01/02/24 5621   prochlorperazine (COMPAZINE) injection 10 mg  10 mg Intravenous Q6H PRN [place] Bing, MD   10 mg at 01/01/24 1646   sodium chloride flush (NS) 0.9 % injection 3 mL  3 mL Intravenous Q12H [place] Bing, MD   3 mL at 01/02/24 0952   sodium chloride flush (NS) 0.9 % injection 3 mL  3 mL Intravenous PRN  Bing, MD   3 mL at 12/31/23 0943   Labs  Pending: T. Pallidum, BCx NGTD, Ucx Negative: legionella urine ag.   Recent Labs  Lab 12/31/23 0641 01/01/24 0337 01/02/24 0550  WBC 2.7* 3.0* 2.8*  HGB 10.6* 10.3* 10.9*  HCT 32.8* 32.7* 33.7*  PLT 176 204 197    Recent Labs  Lab 12/27/23 1949 12/28/23 0754  NA 135  --   K 3.6  --   CL 104  --   CO2 24  --    BUN 10  --   CREATININE 0.67 0.78  CALCIUM 9.1  --   PROT 7.3  --   BILITOT 0.2  --   ALKPHOS 41  --   ALT 10  --   AST 13*  --   GLUCOSE 77  --    Radiology No new imaging  Assessment & Plan:  Patient stable *GYN: s/p formal ID consult, appreciate recommendations. Reviewed tentative plan to re-image tomorrow. Will continue rocephin/doxy/flagyl. Blood Cx pending. Ucx negative.   *Respiratory: Pulm felt unlikely PNA. Strep and legionella negative.  *+RPR: see above. Tpall negative.  *Pain: continue with PO PRNs *FEN/GI: Regular diet.  *PPx: Lovenox daily. SCDs *Dispo: inpatient for now, possible D/c Monday if continued interval improvement and CT shows improvement.   Code Status: Full Code  Total time taking care of the patient was 20 minutes, with greater than 50% of the time spent in face to face interaction with the patient.  Milas Hock, MD Attending Center for [facility])

## 2024-01-02 NOTE — Plan of Care (Signed)
  Problem: Clinical Measurements: Goal: Diagnostic test results will improve Outcome: Progressing   Problem: Pain Managment: Goal: General experience of comfort will improve and/or be controlled Outcome: Progressing   Problem: Safety: Goal: Ability to remain free from injury will improve Outcome: Progressing

## 2024-01-03 ENCOUNTER — Inpatient Hospital Stay (HOSPITAL_COMMUNITY)

## 2024-01-03 LAB — COMPREHENSIVE METABOLIC PANEL WITH GFR
ALT: 10 U/L (ref 0–44)
AST: 17 U/L (ref 15–41)
Albumin: 3 g/dL — ABNORMAL LOW (ref 3.5–5.0)
Alkaline Phosphatase: 37 U/L — ABNORMAL LOW (ref 38–126)
Anion gap: 5 (ref 5–15)
BUN: 11 mg/dL (ref 6–20)
CO2: 23 mmol/L (ref 22–32)
Calcium: 8.9 mg/dL (ref 8.9–10.3)
Chloride: 108 mmol/L (ref 98–111)
Creatinine, Ser: 0.91 mg/dL (ref 0.44–1.00)
GFR, Estimated: >60 mL/min (ref >60)
Glucose, Bld: 117 mg/dL — ABNORMAL HIGH (ref 70–99)
Potassium: 3.9 mmol/L (ref 3.5–5.1)
Sodium: 136 mmol/L (ref 135–145)
Total Bilirubin: 0.5 mg/dL (ref 0.0–1.2)
Total Protein: 6.5 g/dL (ref 6.5–8.1)

## 2024-01-03 LAB — CBC
HCT: 33.2 % — ABNORMAL LOW (ref 36.0–46.0)
Hemoglobin: 10.5 g/dL — ABNORMAL LOW (ref 12.0–15.0)
MCH: 22.5 pg — ABNORMAL LOW (ref 26.0–34.0)
MCHC: 31.6 g/dL (ref 30.0–36.0)
MCV: 71.2 fL — ABNORMAL LOW (ref 80.0–100.0)
Platelets: 195 10*3/uL (ref 150–400)
RBC: 4.66 MIL/uL (ref 3.87–5.11)
RDW: 13.1 % (ref 11.5–15.5)
WBC: 3.1 10*3/uL — ABNORMAL LOW (ref 4.0–10.5)
nRBC: 0 % (ref 0.0–0.2)

## 2024-01-03 MED ORDER — DOXYCYCLINE HYCLATE 100 MG PO TABS
100.0000 mg | ORAL_TABLET | Freq: Two times a day (BID) | ORAL | Status: DC
  Administered 2024-01-03 – 2024-01-04 (×2): 100 mg via ORAL
  Filled 2024-01-03 (×2): qty 1

## 2024-01-03 MED ORDER — IOHEXOL 350 MG/ML SOLN
75.0000 mL | Freq: Once | INTRAVENOUS | Status: AC | PRN
  Administered 2024-01-03: 75 mL via INTRAVENOUS

## 2024-01-03 NOTE — Progress Notes (Addendum)
 Gynecology Progress Note  Admission Date: 12/27/2023 Current Date: 01/03/2024 10:38 AM  Laura [name] is a 23 y.o. [phone] admitted for 6-7cm left TOA vs hydrosalpinx and POD#1 s/p IR drain attempt  History complicated by: Patient Active Problem List   Diagnosis Date Noted   Positive RPR test 12/30/2023   History of sexually transmitted disease 12/30/2023   Tubo-ovarian abscess 12/28/2023   TOA (tubo-ovarian abscess) 12/28/2023   Abnormal chest CT 12/28/2023    ROS and patient/family/surgical history, located on admission H&P note dated 12/27/2023, have been reviewed, and there are no changes except as noted below Yesterday/Overnight Events:  None  Subjective:  Required no pain medication overnight (still with pain, reports it was a 5/10 but then resolved with time and rest). Ambulated halls yesterday but significant discomfort with this. +flatus. + bowel movements. Voiding without issues. No F/C/N/V.   Objective:    Current Vital Signs 24h Vital Sign Ranges  T 98.2 F (36.8 C) Temp  Avg: 98.3 F (36.8 C)  Min: 98.2 F (36.8 C)  Max: 98.4 F (36.9 C)  BP 113/71 BP  Min: 106/71  Max: 115/78  HR 72 Pulse  Avg: 73.5  Min: 66  Max: 80  RR 18 Resp  Avg: 17.3  Min: 17  Max: 18  SaO2 100 % Room Air SpO2  Avg: 99.8 %  Min: 99 %  Max: 100 %       24 Hour I/O Current Shift I/O  Time Ins Outs 04/05 0701 - 04/06 0700 In: 1280.2 [P.O.:480] Out: -  No intake/output data recorded.   Patient Vitals for the past 24 hrs:  BP Temp Temp src Pulse Resp SpO2  01/03/24 0832 113/71 98.2 F (36.8 C) Oral 72 18 100 %  01/03/24 0440 111/67 98.4 F (36.9 C) -- 66 17 100 %  01/02/24 2005 115/78 98.2 F (36.8 C) -- 76 17 99 %  01/02/24 1440 106/71 -- -- 80 17 100 %   Physical exam: General appearance: NAD Abdomen: No TTP, no peritoneal signs. +BS, mild to moderately distended.  Lungs: clear to auscultation bilaterally Heart: Regular rate Extremities: no lower extremity edema Skin: feels  warm Psych: appropriate Neurologic: Grossly normal  Medications Current Facility-Administered Medications  Medication Dose Route Frequency Provider Last Rate Last Admin   acetaminophen (TYLENOL) tablet 650 mg  650 mg Oral Q6H PRN [place] Bing, MD       cefTRIAXone (ROCEPHIN) 1 g in sodium chloride 0.9 % 100 mL IVPB  1 g Intravenous Q24H [place] Bing, MD 200 mL/hr at 01/02/24 1725 1 g at 01/02/24 1725   doxycycline (VIBRAMYCIN) 100 mg in sodium chloride 0.9 % 250 mL IVPB  100 mg Intravenous Q12H [place] Bing, MD 125 mL/hr at 01/03/24 1029 100 mg at 01/03/24 1029   ibuprofen (ADVIL) tablet 600 mg  600 mg Oral Q6H PRN [place] Bing, MD   600 mg at 01/01/24 0816   metroNIDAZOLE (FLAGYL) IVPB 500 mg  500 mg Intravenous Q12H [place] Bing, MD 100 mL/hr at 01/03/24 0917 500 mg at 01/03/24 0917   ondansetron (ZOFRAN) tablet 4 mg  4 mg Oral Q6H PRN [place] Bing, MD   4 mg at 01/01/24 1459   Or   ondansetron (ZOFRAN) injection 4 mg  4 mg Intravenous Q6H PRN  Bing, MD   4 mg at 01/01/24 0405   oxyCODONE (Oxy IR/ROXICODONE) immediate release tablet 5-10 mg  5-10 mg Oral Q4H PRN  Bing, MD   10 mg at 01/01/24 1455  polyethylene glycol (MIRALAX / GLYCOLAX) packet 17 g  17 g Oral Daily [place] Bing, MD   17 g at 01/03/24 1610   prochlorperazine (COMPAZINE) injection 10 mg  10 mg Intravenous Q6H PRN [place] Bing, MD   10 mg at 01/01/24 1646   sodium chloride flush (NS) 0.9 % injection 3 mL  3 mL Intravenous Q12H [place] Bing, MD   3 mL at 01/03/24 0913   sodium chloride flush (NS) 0.9 % injection 3 mL  3 mL Intravenous PRN [place] Bing, MD   3 mL at 12/31/23 0943   Labs  Pending: T. Pallidum, BCx NGTD, Ucx Negative: legionella urine ag.   Recent Labs  Lab 01/01/24 0337 01/02/24 0550 01/03/24 0620  WBC 3.0* 2.8* 3.1*  HGB 10.3* 10.9* 10.5*  HCT 32.7* 33.7* 33.2*  PLT 204 197 195    Recent Labs  Lab 12/27/23 1949 12/28/23 0754  NA 135   --   K 3.6  --   CL 104  --   CO2 24  --   BUN 10  --   CREATININE 0.67 0.78  CALCIUM 9.1  --   PROT 7.3  --   BILITOT 0.2  --   ALKPHOS 41  --   ALT 10  --   AST 13*  --   GLUCOSE 77  --    Radiology No new imaging  Assessment & Plan:  Patient stable *GYN: s/p formal ID consult, appreciate recommendations. Will continue rocephin/doxy/flagyl. Blood Cx pending. Ucx negative. CT ordered but pending CMP.  *Respiratory: Pulm felt unlikely PNA. Strep and legionella negative.  *+RPR: see above. Tpall negative.  *Pain: continue with PO PRNs *FEN/GI: Regular diet.  *PPx: Lovenox daily. SCDs *Dispo: inpatient for now, possible D/c Monday. Msg sent to [facility] to arrange 2 week follow up. Will fill out work forms and return to patient tomorrow.   Code Status: Full Code  Total time taking care of the patient was 20 minutes, with greater than 50% of the time spent in face to face interaction with the patient.  Milas Hock, MD Attending Center for [facility])

## 2024-01-03 NOTE — Plan of Care (Signed)

## 2024-01-03 NOTE — Plan of Care (Signed)

## 2024-01-04 ENCOUNTER — Other Ambulatory Visit (HOSPITAL_COMMUNITY): Payer: Self-pay

## 2024-01-04 DIAGNOSIS — R9389 Abnormal findings on diagnostic imaging of other specified body structures: Secondary | ICD-10-CM

## 2024-01-04 DIAGNOSIS — D72819 Decreased white blood cell count, unspecified: Secondary | ICD-10-CM

## 2024-01-04 DIAGNOSIS — Z8619 Personal history of other infectious and parasitic diseases: Secondary | ICD-10-CM

## 2024-01-04 LAB — CBC
HCT: 33.9 % — ABNORMAL LOW (ref 36.0–46.0)
Hemoglobin: 10.7 g/dL — ABNORMAL LOW (ref 12.0–15.0)
MCH: 22.5 pg — ABNORMAL LOW (ref 26.0–34.0)
MCHC: 31.6 g/dL (ref 30.0–36.0)
MCV: 71.2 fL — ABNORMAL LOW (ref 80.0–100.0)
Platelets: 215 10*3/uL (ref 150–400)
RBC: 4.76 MIL/uL (ref 3.87–5.11)
RDW: 13.2 % (ref 11.5–15.5)
WBC: 3.1 10*3/uL — ABNORMAL LOW (ref 4.0–10.5)
nRBC: 0 % (ref 0.0–0.2)

## 2024-01-04 MED ORDER — ONDANSETRON HCL 4 MG PO TABS
4.0000 mg | ORAL_TABLET | Freq: Four times a day (QID) | ORAL | 0 refills | Status: DC | PRN
  Filled 2024-01-04: qty 20, 5d supply, fill #0

## 2024-01-04 MED ORDER — OXYCODONE HCL 5 MG PO TABS
5.0000 mg | ORAL_TABLET | ORAL | 0 refills | Status: DC | PRN
  Filled 2024-01-04: qty 20, 6d supply, fill #0

## 2024-01-04 MED ORDER — AMOXICILLIN-POT CLAVULANATE 875-125 MG PO TABS
1.0000 | ORAL_TABLET | Freq: Two times a day (BID) | ORAL | 0 refills | Status: DC
  Filled 2024-01-04: qty 60, 30d supply, fill #0

## 2024-01-04 MED ORDER — AMOXICILLIN-POT CLAVULANATE 875-125 MG PO TABS
1.0000 | ORAL_TABLET | Freq: Two times a day (BID) | ORAL | 0 refills | Status: AC
Start: 2024-01-04 — End: 2024-02-03
  Filled 2024-01-04: qty 60, 30d supply, fill #0

## 2024-01-04 MED ORDER — IBUPROFEN 600 MG PO TABS
600.0000 mg | ORAL_TABLET | Freq: Four times a day (QID) | ORAL | 0 refills | Status: DC | PRN
  Filled 2024-01-04: qty 30, 8d supply, fill #0

## 2024-01-04 MED ORDER — AMOXICILLIN-POT CLAVULANATE 875-125 MG PO TABS
1.0000 | ORAL_TABLET | Freq: Two times a day (BID) | ORAL | Status: DC
  Administered 2024-01-04: 1 via ORAL
  Filled 2024-01-04: qty 1

## 2024-01-04 NOTE — Discharge Summary (Signed)
 Discharge Summary   Admit Date: 12/27/2023 Discharge Date: 01/04/2024 Discharging Service: OBGYN  Primary OBGYN: n/a Admitting Physician: [place] Bing, MD  Discharge Physician: Mariel Aloe  Referring Provider: n/a  Primary Care Provider: [facility]  Admission Diagnoses: Tubo-ovarian abscess [N70.93] Lower abdominal pain [R10.30] TOA (tubo-ovarian abscess) [N70.93]   Discharge Diagnoses: Same  Consult Orders: IP CONSULT TO INFECTIOUS DISEASES MEDS TO BEDS PHARMACY CONSULT ([facility] ONLY)   Surgeries/Procedures Performed: none  History and Physical: See below  Hospital Course: Pt was admitted on 12/28/23 with lower abdominal pain.  Imaging revealed a left TOA.  The patient was admitted for IV antibiotics.  Possible pneumonia was seen.  Pt was reimaged due to lack of improvement on 4/2.  She was also seen by interventional radiology for possible drain placement.  No safe window was noted for placement.  Infectious disease was consulted since the drain could not be placed.  Pt continued to improve.  Follow up CT showed decrease in size of abscess/fluid collection.  ID saw patient on 01/04/24 and discontinued flagyl and doxycycline.  Pt was started on augmentin.  Spoke to ID attending.  He recommended 30 days of antibiotics and a follow up in the ID clinic.  ID will arrange that.  Gyn will also see the patient in about 5-6 weeks.  Discharge Exam:  Vitals:   01/03/24 1700 01/03/24 2112 01/04/24 0457 01/04/24 0806  BP: 119/75 107/63 111/81 109/72  Pulse: 78 72 67 67  Resp: 18  16 18   Temp:   98.2 F (36.8 C) 98.3 F (36.8 C)  TempSrc:   Oral Oral  SpO2: 100% 100% 100% 100%  Weight:      Height:        Temp:  [98.2 F (36.8 C)-98.3 F (36.8 C)] 98.3 F (36.8 C) (04/07 0806) Pulse Rate:  [67-78] 67 (04/07 0806) Resp:  [16-18] 18 (04/07 0806) BP: (107-119)/(63-81) 109/72 (04/07 0806) SpO2:  [100 %] 100 % (04/07 0806) I/O last 3 completed shifts: In:  690.1 [P.O.:240; IV Piggyback:450.1] Out: -  No intake/output data recorded. No intake or output data in the 24 hours ending 01/04/24 1119   Current Vital Signs 24h Vital Sign Ranges  T 98.3 F (36.8 C) Temp  Avg: 98.3 F (36.8 C)  Min: 98.2 F (36.8 C)  Max: 98.3 F (36.8 C)  BP 109/72 BP  Min: 107/63  Max: 119/75  HR 67 Pulse  Avg: 71  Min: 67  Max: 78  RR 18 Resp  Avg: 17.3  Min: 16  Max: 18  SaO2 100 % Room Air SpO2  Avg: 100 %  Min: 100 %  Max: 100 %       24 Hour I/O Current Shift I/O  Time Ins Outs No intake/output data recorded. No intake/output data recorded.   Patient Vitals for the past 12 hrs:  BP Temp Temp src Pulse Resp SpO2  01/04/24 0806 109/72 98.3 F (36.8 C) Oral 67 18 100 %  01/04/24 0457 111/81 98.2 F (36.8 C) Oral 67 16 100 %    Patient Vitals for the past 6 hrs:  BP Temp Temp src Pulse Resp SpO2  01/04/24 0806 109/72 98.3 F (36.8 C) Oral 67 18 100 %     Patient Vitals for the past 24 hrs:  BP Temp Temp src Pulse Resp SpO2  01/04/24 0806 109/72 98.3 F (36.8 C) Oral 67 18 100 %  01/04/24 0457 111/81 98.2 F (36.8 C) Oral 67 16  100 %  01/03/24 2112 107/63 -- -- 72 -- 100 %  01/03/24 1700 119/75 -- -- 78 18 100 %    General appearance: Well nourished, well developed female in no acute distress.  Neck:  Supple, normal appearance, and no thyromegaly  Cardiovascular: regular rate and rhythm Respiratory:  Clear to auscultation bilateral. Normal respiratory effort Abdomen: positive bowel sounds and no masses, hernias; diffusely non tender to palpation, non distended Breasts: breasts appear normal, no suspicious masses, no skin or nipple changes or axillary nodes, not examined. Neuro/Psych:  Normal mood and affect.  Skin:  Warm and dry.  Lymphatic:  No inguinal lymphadenopathy.   Pelvic exam: not performed  Discharge Disposition:  Home  Patient Instructions:  Continue newly prescribed antibiotics from infectious disease   Results Pending  at Discharge:  Blood cultures  Discharge Medications: Allergies as of 01/04/2024   No Known Allergies      Medication List     STOP taking these medications    bisacodyl 5 MG EC tablet Commonly known as: DULCOLAX   busPIRone 5 MG tablet Commonly known as: BUSPAR   escitalopram 10 MG tablet Commonly known as: LEXAPRO   polyethylene glycol 17 g packet Commonly known as: MIRALAX / GLYCOLAX       TAKE these medications    albuterol 108 (90 Base) MCG/ACT inhaler Commonly known as: VENTOLIN HFA Inhale 2 puffs into the lungs every 6 (six) hours as needed for wheezing or shortness of breath.   albuterol (2.5 MG/3ML) 0.083% nebulizer solution Commonly known as: PROVENTIL Take 3 mLs (2.5 mg total) by nebulization every 6 (six) hours as needed for wheezing or shortness of breath.   amoxicillin-clavulanate 875-125 MG tablet Commonly known as: AUGMENTIN Take 1 tablet by mouth 2 (two) times daily.   amoxicillin-clavulanate 875-125 MG tablet Commonly known as: AUGMENTIN Take 1 tablet by mouth every 12 (twelve) hours.   ibuprofen 600 MG tablet Commonly known as: ADVIL Take 1 tablet (600 mg total) by mouth every 6 (six) hours as needed (mild pain).   medroxyPROGESTERone 150 MG/ML injection Commonly known as: DEPO-PROVERA Inject 150 mg into the muscle every 3 (three) months.   ondansetron 4 MG tablet Commonly known as: ZOFRAN Take 1 tablet (4 mg total) by mouth every 6 (six) hours as needed for nausea.   oxyCODONE 5 MG immediate release tablet Commonly known as: Oxy IR/ROXICODONE Take 1-2 tablets (5-10 mg total) by mouth every 4 (four) hours as needed for severe pain (pain score 7-10) or moderate pain (pain score 4-6).         Follow up in 5 weeks at [facility]  [employer], MD Attending Center for [employer] [facility])

## 2024-01-04 NOTE — Progress Notes (Signed)
 Reviewed AVS, patient expressed understanding of medications, MD follow up reviewed.   Patient states all belongings brought to the hospital at time of admission are accounted for and packed to take home.  Picked up medications from [facility] pharmacy. Pt transported to entrance A where family member was waiting in vehicle to transport home.

## 2024-01-04 NOTE — Progress Notes (Addendum)
 [facility]  Date of Admission:  12/27/2023     Reason for Follow Up: Tubo-ovarian abscess  Total days of antibiotics 9         ASSESSMENT:  [name] abdomen/pelvis shows interval improvement in the size of the abscess now down to 4.2 x 2.5 cm from 6.5 x 2.9 cm in the setting of tubo-ovarian abscess. Blood cultures from 12/31/2023 remain without growth.  Tolerating antibiotics with no adverse side effects. Discussed recommended plan of care  with change to oral regimen with Augmentin 875/125 mg p.o. twice daily and discontinue doxycycline for at least 4 weeks with recommendation for follow-up [name] scan in about 3 weeks and likely will need to continue antibiotics until abscess is completely resolved or slightly beyond.  Will continue to monitor blood cultures for organisms.  Okay for discharge from ID standpoint.  Medications to be filled at [facility] pharmacy.  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 Follow-up arranged in ID clinic on 01/25/2024 with Dr. Elinor Parkinson.  Continue universal/tender precautions while hospitalized.  Remaining medical and supportive care per primary team.  PLAN:  Change antibiotics to Augmentin 875/125 mg for  4 weeks and discontinue doxycycline.  Follow up [name] imaging in about 3 weeks Follow-up in ID clinic on 01/25/2024 at 1:45 PM with Dr. Elinor Parkinson.  Okay for discharge from ID standpoint. Continue universal/standard precautions. Remaining medical and supportive care per primary team.  Active Problems:   TOA (tubo-ovarian abscess)   Abnormal chest [name]   Positive RPR test   History of sexually transmitted disease   Leukopenia    amoxicillin-clavulanate  1 tablet Oral Q12H   polyethylene glycol  17 g Oral Daily   sodium chloride flush  3 mL Intravenous Q12H    SUBJECTIVE:  Afebrile overnight with no acute events. Tolerating antibiotics with no adverse side effects. Denies fevers, chills, or sweats.   No Known  Allergies   Review of Systems: Review of Systems  Constitutional:  Negative for chills, fever and weight loss.  Respiratory:  Negative for cough, shortness of breath and wheezing.   Cardiovascular:  Negative for chest pain and leg swelling.  Gastrointestinal:  Negative for abdominal pain, constipation, diarrhea, nausea and vomiting.  Skin:  Negative for rash.      OBJECTIVE: Vitals:   01/03/24 1700 01/03/24 2112 01/04/24 0457 01/04/24 0806  BP: 119/75 107/63 111/81 109/72  Pulse: 78 72 67 67  Resp: 18  16 18   Temp:   98.2 F (36.8 C) 98.3 F (36.8 C)  TempSrc:   Oral Oral  SpO2: 100% 100% 100% 100%  Weight:      Height:       Body mass index is 21.79 kg/m.  Physical Exam Constitutional:      General: She is not in acute distress.    Appearance: She is well-developed.  Cardiovascular:     Rate and Rhythm: Normal rate and regular rhythm.     Heart sounds: Normal heart sounds.  Pulmonary:     Effort: Pulmonary effort is normal.     Breath sounds: Normal breath sounds.  Skin:    General: Skin is warm and dry.  Neurological:     Mental Status: She is alert and oriented to person, place, and time.     Lab Results Lab Results  Component Value Date   WBC 3.1 (L) 01/04/2024   HGB 10.7 (L) 01/04/2024   HCT 33.9 (L) 01/04/2024  MCV 71.2 (L) 01/04/2024   PLT 215 01/04/2024    Lab Results  Component Value Date   CREATININE 0.91 01/03/2024   BUN 11 01/03/2024   NA 136 01/03/2024   K 3.9 01/03/2024   CL 108 01/03/2024   CO2 23 01/03/2024    Lab Results  Component Value Date   ALT 10 01/03/2024   AST 17 01/03/2024   ALKPHOS 37 (L) 01/03/2024   BILITOT 0.5 01/03/2024     Microbiology: Recent Results (from the past 240 hours)  Wet prep, genital     Status: None   Collection Time: 12/27/23 11:45 PM   Specimen: PATH Cytology Cervicovaginal Ancillary Only  Result Value Ref Range Status   Yeast Wet Prep HPF POC NONE SEEN NONE SEEN Final   Trich, Wet Prep  NONE SEEN NONE SEEN Final   Clue Cells Wet Prep HPF POC NONE SEEN NONE SEEN Final   WBC, Wet Prep HPF POC <10 <10 Final   Sperm NONE SEEN  Final    Comment: Performed at [facility], [address], [place], [place] [address]  Resp panel by RT-PCR (RSV, Flu A&B, Covid) Anterior Nasal Swab     Status: None   Collection Time: 12/28/23 11:24 AM   Specimen: Anterior Nasal Swab  Result Value Ref Range Status   SARS Coronavirus 2 by RT PCR NEGATIVE NEGATIVE Final   Influenza A by PCR NEGATIVE NEGATIVE Final   Influenza B by PCR NEGATIVE NEGATIVE Final    Comment: (NOTE) The Xpert Xpress SARS-CoV-2/FLU/RSV plus assay is intended as an aid in the diagnosis of influenza from Nasopharyngeal swab specimens and should not be used as a sole basis for treatment. Nasal washings and aspirates are unacceptable for Xpert Xpress SARS-CoV-2/FLU/RSV testing.  Fact Sheet for Patients: BloggerCourse.com  Fact Sheet for Healthcare Providers: SeriousBroker.it  This test is not yet approved or cleared by the [place] FDA and has been authorized for detection and/or diagnosis of SARS-CoV-2 by FDA under an Emergency Use Authorization (EUA). This EUA will remain in effect (meaning this test can be used) for the duration of the COVID-19 declaration under Section 564(b)(1) of the Act, 21 U.S.C. section 360bbb-3(b)(1), unless the authorization is terminated or revoked.     Resp Syncytial Virus by PCR NEGATIVE NEGATIVE Final    Comment: (NOTE) Fact Sheet for Patients: BloggerCourse.com  Fact Sheet for Healthcare Providers: SeriousBroker.it  This test is not yet approved or cleared by the [place] FDA and has been authorized for detection and/or diagnosis of SARS-CoV-2 by FDA under an Emergency Use Authorization (EUA). This EUA will remain in effect (meaning this test can be  used) for the duration of the COVID-19 declaration under Section 564(b)(1) of the Act, 21 U.S.C. section 360bbb-3(b)(1), unless the authorization is terminated or revoked.  Performed at [facility], [address], [place], [place] [address]   Culture, [place] Urine     Status: None   Collection Time: 12/31/23  9:23 AM   Specimen: OB Clean Catch; Urine  Result Value Ref Range Status   Specimen Description OB CLEAN CATCH  Final   Special Requests Normal  Final   Culture   Final    NO GROWTH Performed at [facility], [address], [place], [place] [address]    Report Status 01/01/2024 FINAL  Final  Culture, blood (Routine X 2) w Reflex to ID Panel     Status: None (Preliminary result)   Collection Time:  12/31/23  9:43 AM   Specimen: BLOOD RIGHT ARM  Result Value Ref Range Status   Specimen Description BLOOD RIGHT ARM  Final   Special Requests   Final    BOTTLES DRAWN AEROBIC AND ANAEROBIC Blood Culture results may not be optimal due to an inadequate volume of blood received in culture bottles   Culture   Final    NO GROWTH 4 DAYS Performed at [facility], [address] [place], [place] [address]    Report Status PENDING  Incomplete  Culture, blood (Routine X 2) w Reflex to ID Panel     Status: None (Preliminary result)   Collection Time: 12/31/23  9:43 AM   Specimen: BLOOD LEFT ARM  Result Value Ref Range Status   Specimen Description BLOOD LEFT ARM  Final   Special Requests   Final    BOTTLES DRAWN AEROBIC AND ANAEROBIC Blood Culture results may not be optimal due to an inadequate volume of blood received in culture bottles   Culture   Final    NO GROWTH 4 DAYS Performed at [facility], [address], [place], [place] [address]    Report Status PENDING  Incomplete    I have personally spent 25 minutes involved in face-to-face and non-face-to-face activities for this patient on the day of the visit. Professional time spent includes the following  activities: Preparing to see the patient (review of tests), Obtaining and/or reviewing separately obtained history (admission/discharge record), Performing a medically appropriate examination and/or evaluation , Ordering medications/tests/procedures, referring and communicating with other health care professionals, Documenting clinical information in the EMR, Independently interpreting results (not separately reported), Communicating results to the patient/family/caregiver, Counseling and educating the patient/family/caregiver and Care coordination (not separately reported).   Marcos Eke, NP [facility]  01/04/2024  11:14 AM     INFECTIOUS DISEASE ATTENDING ADDENDUM:    This patient has been seen and discussed with the Marcos Eke FNP. I have reviewed the pertinent laboratory and radiographic findings and I have independently examined the patient.  Please see the FNP's note for complete details. I concur with his findings and agree with the plan as outlined in his note with the following additions/corrections:  I have already atttested this note but neglected to place time attestation.  I have personally spent 50 minutes involved in face-to-face and non-face-to-face activities for this patient on the day of the visit. Professional time spent includes the following activities: Preparing to see the patient (review of tests), Obtaining and/or reviewing separately obtained history (admission/discharge record), Performing a medically appropriate examination and/or evaluation , Ordering medications/tests/procedures, referring and communicating with other health care professionals, Documenting clinical information in the EMR, Independently interpreting results (not separately reported), Communicating results to the patient/family/caregiver, Counseling and educating the patient/family/caregiver and Care coordination (not separately reported).    Evaluation of the patient requires complex antimicrobial therapy evaluation, counseling , isolation needs to reduce disease transmission and risk assessment and mitigation.

## 2024-01-05 LAB — CULTURE, BLOOD (ROUTINE X 2)
Culture: NO GROWTH
Culture: NO GROWTH

## 2024-01-11 ENCOUNTER — Telehealth: Payer: Self-pay

## 2024-01-11 NOTE — Telephone Encounter (Signed)
 Patient called with concerns of lower left-sided abdominal pain. Was seen in the hospital for [facility] and is currently taking Augmentin.   Reports the pain started yesterday and is not as severe as the pain that originally brought her to the hospital but that it's "getting there." She denies fever or chills.   Describes the pain as radiating to her lower back. She has not noted anything that makes the pain worse. States ibuprofen and oxycodone have provided some relief.   Discussed that concerns would be sent to provider, but that if pain worsens at all that she should go to the emergency room for prompt evaluation. Will route to provider.   Olen Eaves, BSN, RN

## 2024-01-11 NOTE — Telephone Encounter (Signed)
 Called patient to discuss provider's recommendations, no answer. Left HIPAA compliant voicemail stating MYChart message would be sent and to call with any questions.   Daneil Beem, BSN, RN

## 2024-01-12 NOTE — Telephone Encounter (Signed)
 Second attempt to reach patient to relay provider's recommendations, no answer. Left HIPAA compliant voicemail requesting callback.   Lyriq Jarchow, BSN, RN

## 2024-01-14 NOTE — Telephone Encounter (Signed)
 Called patient to discuss pain concerns. Relayed that since pain has worsened she should go to the emergency department for evaluation. She states she will try to get there later today or tomorrow. Encouraged her to go today to prevent any worsening of her condition. Patient verbalized understanding and has no further questions.   Laquasha Groome, BSN, RN

## 2024-01-25 ENCOUNTER — Other Ambulatory Visit: Payer: Self-pay

## 2024-01-25 ENCOUNTER — Ambulatory Visit (INDEPENDENT_AMBULATORY_CARE_PROVIDER_SITE_OTHER): Admitting: Infectious Diseases

## 2024-01-25 ENCOUNTER — Encounter: Payer: Self-pay | Admitting: Infectious Diseases

## 2024-01-25 VITALS — BP 108/76 | HR 86 | Temp 98.2°F | Ht 63.0 in | Wt 127.0 lb

## 2024-01-25 DIAGNOSIS — Z79899 Other long term (current) drug therapy: Secondary | ICD-10-CM | POA: Diagnosis not present

## 2024-01-25 DIAGNOSIS — N7093 Salpingitis and oophoritis, unspecified: Secondary | ICD-10-CM

## 2024-01-25 MED ORDER — AMOXICILLIN-POT CLAVULANATE 875-125 MG PO TABS: 1.0000 | ORAL_TABLET | Freq: Two times a day (BID) | ORAL | 0 refills | Status: DC

## 2024-01-25 NOTE — Progress Notes (Signed)
 Patient Active Problem List   Diagnosis Date Noted   Medication management 01/25/2024   Positive RPR test 12/30/2023   History of sexually transmitted disease 12/30/2023   TOA (tubo-ovarian abscess) 12/28/2023   Abnormal chest CT 12/28/2023   Depressive disorder 03/15/2023   GAD (generalized anxiety disorder) 05/13/2022   PTSD (post-traumatic stress disorder) 05/13/2022   Leukopenia 01/07/2021   Asthma 08/15/2019    Patient's Medications  New Prescriptions   No medications on file  Previous Medications   ALBUTEROL  (PROVENTIL ) (2.5 MG/3ML) 0.083% NEBULIZER SOLUTION    Take 3 mLs (2.5 mg total) by nebulization every 6 (six) hours as needed for wheezing or shortness of breath.   ALBUTEROL  (VENTOLIN  HFA) 108 (90 BASE) MCG/ACT INHALER    Inhale 2 puffs into the lungs every 6 (six) hours as needed for wheezing or shortness of breath.   AMOXICILLIN -CLAVULANATE (AUGMENTIN ) 875-125 MG TABLET    Take 1 tablet by mouth 2 (two) times daily.   AMOXICILLIN -CLAVULANATE (AUGMENTIN ) 875-125 MG TABLET    Take 1 tablet by mouth every 12 (twelve) hours.   BUSPIRONE (BUSPAR) 15 MG TABLET    Take 15 mg by mouth.   ESCITALOPRAM (LEXAPRO) 20 MG TABLET    Take 20 mg by mouth.   IBUPROFEN  (ADVIL ) 600 MG TABLET    Take 1 tablet (600 mg total) by mouth every 6 (six) hours as needed (mild pain).   MEDROXYPROGESTERONE (DEPO-PROVERA) 150 MG/ML INJECTION    Inject 150 mg into the muscle every 3 (three) months.   ONDANSETRON  (ZOFRAN ) 4 MG TABLET    Take 1 tablet (4 mg total) by mouth every 6 (six) hours as needed for nausea.   OXYCODONE  (OXY IR/ROXICODONE ) 5 MG IMMEDIATE RELEASE TABLET    Take 1-2 tablets (5-10 mg total) by mouth every 4 (four) hours as needed for severe pain (pain score 7-10) or moderate pain (pain score 4-6).  Modified Medications   No medications on file  Discontinued Medications   No medications on file    Subjective: 23 year old female [id number] with prior history of anemia, asthma,  anxiety/depression, genital herpes who is here for HFU after recent hospital admission 3/30-4/7 for TOA. No IR drain placed due to no safe window.   Repeat CT on 4/6 with improving size of abscess. Initially on doxycycline , ceftriaxone  and metronidazole  which was eventually transitioned to Po augmentin  and discharged on 4/7 with a 1 month supply.   4/28 Reports being compliant with Augmentin . Still has pain in the left side when she lies in the left side and when she lifts heavy. She has mild nausea sometimes but no vomiting or diarrhea. Having regular BMs. Appetite is OK. Seen by PCP 4/18. I told her she may not be quite ready to go back to work yet as she still has abdominal pain which gets worse when lifting groceries.   Review of Systems: all systems reviewed with pertinent positives and negatives as listed above including GI.   Past Medical History:  Diagnosis Date   Anemia    Anxiety    Asthma    Chlamydia    Depression    Genital herpes    Gonorrhea    Ovarian cyst    Past Surgical History:  Procedure Laterality Date   OVARIAN CYST REMOVAL     Social History   Tobacco Use   Smoking status: Former    Current packs/day: 0.00    Types: Cigarettes, Cigars    Quit date: 10/07/2018  Years since quitting: 5.3   Smokeless tobacco: Never  Vaping Use   Vaping status: Some Days  Substance Use Topics   Alcohol use: Yes    Comment: socially   Drug use: No    Family History  Problem Relation Age of Onset   Epilepsy Father     No Known Allergies  Health Maintenance  Topic Date Due   HPV VACCINES (1 - 3-dose series) Never done   Hepatitis C Screening  Never done   DTaP/Tdap/Td (1 - Tdap) Never done   Pneumococcal Vaccine 14-70 Years old (1 of 2 - PCV) Never done   Cervical Cancer Screening (Pap smear)  Never done   COVID-19 Vaccine (3 - 2024-25 season) 05/31/2023   INFLUENZA VACCINE  04/29/2024   CHLAMYDIA SCREENING  12/26/2024   HIV Screening  Completed    Meningococcal B Vaccine  Aged Out    Objective:  Vitals:   01/25/24 1333  BP: 108/76  Pulse: 86  Temp: 98.2 F (36.8 C)  TempSrc: Temporal  SpO2: 100%  Weight: 127 lb (57.6 kg)  Height: 5\' 3"  (1.6 m)   Body mass index is 22.5 kg/m.  Physical Exam Constitutional:      Appearance: Normal appearance.  HENT:     Head: Normocephalic and atraumatic.      Mouth: Mucous membranes are moist.  Eyes:    Conjunctiva/sclera: Conjunctivae normal.     Pupils: Pupils are equal, round, and b/l symmetrical   Cardiovascular:     Rate and Rhythm: Normal rate and regular rhythm.     Heart sounds:  s1s2  Pulmonary:     Effort: Pulmonary effort is normal.     Breath sounds:   Abdominal:     General: Non distended     Palpations: soft. Non tender  Musculoskeletal:        General: Normal range of motion.   Skin:    General: Skin is warm and dry.     Comments:  Neurological:     General: grossly non focal     Mental Status: awake, alert and oriented to person, place, and time.   Psychiatric:        Mood and Affect: Mood normal.   Lab Results Lab Results  Component Value Date   WBC 3.1 (L) 01/04/2024   HGB 10.7 (L) 01/04/2024   HCT 33.9 (L) 01/04/2024   MCV 71.2 (L) 01/04/2024   PLT 215 01/04/2024    Lab Results  Component Value Date   CREATININE 0.91 01/03/2024   BUN 11 01/03/2024   NA 136 01/03/2024   K 3.9 01/03/2024   CL 108 01/03/2024   CO2 23 01/03/2024    Lab Results  Component Value Date   ALT 10 01/03/2024   AST 17 01/03/2024   ALKPHOS 37 (L) 01/03/2024   BILITOT 0.5 01/03/2024    No results found for: "CHOL", "HDL", "LDLCALC", "LDLDIRECT", "TRIG", "CHOLHDL" Lab Results  Component Value Date   LABRPR Reactive (A) 12/28/2023   No results found for: "HIV1RNAQUANT", "HIV1RNAVL", "CD4TABS"   Imaging CT abdomen pelvis 01/03/24 FINDINGS: Lower chest: Improvement in clustered left lower lobe opacity with mild residual. No pleural effusion.    Hepatobiliary: Scattered hypodensities in the liver, better appreciated on current exam, likely small hemangiomas. Decompressed gallbladder. No biliary dilatation.   Pancreas: Unremarkable. No pancreatic ductal dilatation or surrounding inflammatory changes.   Spleen: Normal in size without focal abnormality.   Adrenals/Urinary Tract: Normal adrenal glands. Mild cortical scarring in  the right kidney. No hydronephrosis. Suspected small left calyceal diverticulum containing a punctate stone. No renal inflammation. Unremarkable urinary bladder.   Stomach/Bowel: Bowel assessment is limited in the absence of enteric contrast. No evidence of bowel obstruction or inflammation. Low lying cecum in the midline pelvis. Normal appendix visualized. Mixed liquid and solid stool in the colon.   Vascular/Lymphatic: Normal caliber abdominal aorta. Patent portal vein. No bulky abdominopelvic adenopathy.   Reproductive: Left adnexal fluid collection has diminished from prior exam, current residual serpiginous fluid-filled structure measures 4.2 x 2.5 cm, previously 6.5 x 2.9 cm. The degree of surrounding enhancement is diminished. The degree of pelvic free fluid has improved. No new adnexal mass or collection.   Other: Diminished pelvic free fluid. No free air. No abdominal wall hernia.   Musculoskeletal: No intramuscular fluid collection. There are no acute or suspicious osseous abnormalities.   IMPRESSION: 1. Left adnexal fluid collection has diminished from prior exam, currently 4.2 x 2.5 cm, previously 6.5 x 2.9 cm. The degree of surrounding enhancement is diminished. The degree of pelvic free fluid has improved. 2. Improvement in clustered left lower lobe opacity with mild residual. Assessment/Plan # Pyosalpinx/TOA - unable to be drained by IR - 4/6 CT abdomen Left adnexal fluid collection down to 4.2*2.5 cm   Plan - continue PO augmentin  as is, refills sent - CT abdomen pelvis w  contrast in a week  - Fu in 2 weeks ( once CT done) - Fu with Gyn as planned   I have personally spent 33 minutes involved in face-to-face and non-face-to-face activities for this patient on the day of the visit. Professional time spent includes the following activities: Preparing to see the patient (review of tests), Obtaining and/or reviewing separately obtained history (admission/discharge record), Performing a medically appropriate examination and/or evaluation , Ordering medications/tests/procedures, referring and communicating with other health care professionals, Documenting clinical information in the EMR, Independently interpreting results (not separately reported), Communicating results to the patient/family/caregiver, Counseling and educating the patient/family/caregiver and Care coordination (not separately reported).   Of note, portions of this note may have been created with voice recognition software. While this note has been edited for accuracy, occasional wrong-word or 'sound-a-like' substitutions may have occurred due to the inherent limitations of voice recognition software.   Melvina Stage, MD [facility] 01/25/2024, 1:42 PM

## 2024-01-26 LAB — CBC
HCT: 35.6 % (ref 35.0–45.0)
Hemoglobin: 10.8 g/dL — ABNORMAL LOW (ref 11.7–15.5)
MCH: 22.1 pg — ABNORMAL LOW (ref 27.0–33.0)
MCHC: 30.3 g/dL — ABNORMAL LOW (ref 32.0–36.0)
MCV: 72.8 fL — ABNORMAL LOW (ref 80.0–100.0)
Platelets: 190 10*3/uL (ref 140–400)
RBC: 4.89 10*6/uL (ref 3.80–5.10)
RDW: 14 % (ref 11.0–15.0)
WBC: 3.3 10*3/uL — ABNORMAL LOW (ref 3.8–10.8)

## 2024-01-26 LAB — COMPREHENSIVE METABOLIC PANEL WITH GFR
AG Ratio: 1.2 (calc) (ref 1.0–2.5)
ALT: 8 U/L (ref 6–29)
AST: 15 U/L (ref 10–30)
Albumin: 4 g/dL (ref 3.6–5.1)
Alkaline phosphatase (APISO): 40 U/L (ref 31–125)
BUN: 12 mg/dL (ref 7–25)
CO2: 21 mmol/L (ref 20–32)
Calcium: 9.2 mg/dL (ref 8.6–10.2)
Chloride: 105 mmol/L (ref 98–110)
Creat: 0.79 mg/dL (ref 0.50–0.96)
Globulin: 3.4 g/dL (ref 1.9–3.7)
Glucose, Bld: 75 mg/dL (ref 65–99)
Potassium: 3.7 mmol/L (ref 3.5–5.3)
Sodium: 139 mmol/L (ref 135–146)
Total Bilirubin: 0.5 mg/dL (ref 0.2–1.2)
Total Protein: 7.4 g/dL (ref 6.1–8.1)
eGFR: 107 mL/min/{1.73_m2} (ref >=60)

## 2024-01-26 LAB — SEDIMENTATION RATE: Sed Rate: 6 mm/h (ref 0–20)

## 2024-01-26 LAB — C-REACTIVE PROTEIN: CRP: <3 mg/L (ref <8.0)

## 2024-01-27 DIAGNOSIS — Z0289 Encounter for other administrative examinations: Secondary | ICD-10-CM

## 2024-01-28 ENCOUNTER — Telehealth: Payer: Self-pay | Admitting: Family Medicine

## 2024-01-28 NOTE — Telephone Encounter (Signed)
 Attempted to reach patient about FMLA papers.

## 2024-02-02 ENCOUNTER — Ambulatory Visit (HOSPITAL_COMMUNITY)
Admission: RE | Admit: 2024-02-02 | Discharge: 2024-02-02 | Disposition: A | Discharge status: Home / Self Care | Source: Ambulatory Visit | Attending: Infectious Diseases | Admitting: Infectious Diseases

## 2024-02-02 DIAGNOSIS — N7093 Salpingitis and oophoritis, unspecified: Secondary | ICD-10-CM | POA: Diagnosis present

## 2024-02-02 MED ORDER — IOHEXOL 350 MG/ML SOLN
75.0000 mL | Freq: Once | INTRAVENOUS | Status: AC | PRN
Start: 2024-02-02 — End: 2024-02-02
  Administered 2024-02-02: 75 mL via INTRAVENOUS

## 2024-02-03 ENCOUNTER — Telehealth: Payer: Self-pay

## 2024-02-03 NOTE — Telephone Encounter (Signed)
-----   Message from Laura [name] sent at 02/03/2024  8:55 AM EDT ----- Please let patient know CT with no persistent signs of infection and she can stop augmentin . However keep appt next week on 5/15.

## 2024-02-03 NOTE — Telephone Encounter (Signed)
 Left voicemail asking patient to return my call.   Yvonne Stopher Lesli Albee, CMA

## 2024-02-10 ENCOUNTER — Other Ambulatory Visit: Payer: Self-pay

## 2024-02-10 ENCOUNTER — Ambulatory Visit: Admitting: Obstetrics and Gynecology

## 2024-02-10 ENCOUNTER — Encounter: Payer: Self-pay | Admitting: Obstetrics and Gynecology

## 2024-02-10 VITALS — BP 108/71 | HR 90 | Wt 132.6 lb

## 2024-02-10 DIAGNOSIS — N7093 Salpingitis and oophoritis, unspecified: Secondary | ICD-10-CM | POA: Diagnosis not present

## 2024-02-10 DIAGNOSIS — Z124 Encounter for screening for malignant neoplasm of cervix: Secondary | ICD-10-CM

## 2024-02-10 DIAGNOSIS — R9389 Abnormal findings on diagnostic imaging of other specified body structures: Secondary | ICD-10-CM | POA: Diagnosis not present

## 2024-02-10 NOTE — Progress Notes (Unsigned)
 Obstetrics and Gynecology New Patient*** Evaluation  Appointment Date: 02/10/2024  OBGYN Clinic: [facility]***  Primary Care Provider: [facility]  Referring Provider: [facility]  Chief Complaint: No chief complaint on file.   History of Present Illness: Laura [name] is a 23 y.o. {Race:20311} G3P3003 (No LMP recorded. Patient has had an injection.), seen for the above chief complaint. Her past medical history is significant for ***    No breast s/s, fevers, chills, chest pain, SOB, nausea, vomiting, abdominal pain, dysuria, hematuria, vaginal itching, dyspareunia, SUI or OAB, diarrhea, constipation, blood in BMs  Review of Systems: {ros; complete:30496}   As Per HPI otherwise negative***  Patient Active Problem List   Diagnosis Date Noted   Medication management 01/25/2024   TOA (tubo-ovarian abscess) 12/28/2023   Abnormal chest CT 12/28/2023   Depressive disorder 03/15/2023   GAD (generalized anxiety disorder) 05/13/2022   PTSD (post-traumatic stress disorder) 05/13/2022   Leukopenia 01/07/2021   Asthma 08/15/2019    {Common ambulatory SmartLinks:19316}  Past Medical History:  Past Medical History:  Diagnosis Date   Anemia    Anxiety    Asthma    Chlamydia    Depression    Genital herpes    Gonorrhea    Ovarian cyst     Past Surgical History:  Past Surgical History:  Procedure Laterality Date   OVARIAN CYST REMOVAL      Past Obstetrical History:  OB History  Gravida Para Term Preterm AB Living  3 3 3   3   SAB IAB Ectopic Multiple Live Births      3    # Outcome Date GA Lbr Len/2nd Weight Sex Type Anes PTL Lv  3 Term 2022     Vag-Spont   LIV  2 Term 2021     Vag-Spont   LIV  1 Term 01/30/18 [redacted]w[redacted]d   F Vag-Spont   LIV    SVD x ***, Cesarean section x ***  Past Gynecological History: As per HPI. Menarche age *** Periods: *** History of Pap Smear(s): {YES UJ:811914} Last pap ***, which was  *** History of STI(s): {YES NW:295621} She is currently using {PLAN CONTRACEPTION:313102} for contraception.  History of HRT use: {YES NO:314532} HPV vaccine series complete: {YES/NO/NOT APPLICABLE:20182}  Social History:  Social History   Socioeconomic History   Marital status: Single    Spouse name: Not on file   Number of children: Not on file   Years of education: Not on file   Highest education level: Not on file  Occupational History   Not on file  Tobacco Use   Smoking status: Former    Current packs/day: 0.00    Types: Cigarettes, Cigars    Quit date: 10/07/2018    Years since quitting: 5.3   Smokeless tobacco: Never  Vaping Use   Vaping status: Some Days  Substance and Sexual Activity   Alcohol use: Yes    Comment: socially   Drug use: No   Sexual activity: Yes    Birth control/protection: Injection  Other Topics Concern   Not on file  Social History Narrative   Not on file   Social Drivers of Health   Financial Resource Strain: Medium Risk (01/14/2024)   Received from [facility]   Overall Financial Resource Strain (CARDIA)    Difficulty of Paying Living Expenses: Somewhat hard  Food Insecurity: Food Insecurity Present (01/14/2024)   Received from [facility]   Hunger Vital Sign  Worried About Programme researcher, broadcasting/film/video in the Last Year: Often true    Ran Out of Food in the Last Year: Sometimes true  Transportation Needs: No Transportation Needs (01/14/2024)   Received from [facility]   PRAPARE - Transportation    Lack of Transportation (Medical): No    Lack of Transportation (Non-Medical): No  Physical Activity: Insufficiently Active (01/14/2024)   Received from [facility]   Exercise Vital Sign    Days of Exercise per Week: 5 days    Minutes of Exercise per Session: 10 min  Stress: Stress Concern Present (01/14/2024)   Received from [facility] - Occupational Stress Questionnaire    Feeling of Stress :  Very much  Social Connections: Moderately Integrated (01/14/2024)   Received from [facility]   Social Network    How would you rate your social network (family, work, friends)?: Adequate participation with social networks  Intimate Partner Violence: Not At Risk (01/14/2024)   Received from [facility]   HITS    Over the last 12 months how often did your partner physically hurt you?: Never    Over the last 12 months how often did your partner insult you or talk down to you?: Never    Over the last 12 months how often did your partner threaten you with physical harm?: Never    Over the last 12 months how often did your partner scream or curse at you?: Never    Family History:  Family History  Problem Relation Age of Onset   Epilepsy Father    She *** any female cancers, bleeding or blood clotting disorders.   Health Maintenance:  Mammogram(s): {YES WU:981191} Date: *** Colonoscopy: {YES YN:829562} Date: *** Flu shot UTD:  {YES/NO/NOT APPLICABLE:20182}  Medications Laura [name] had no medications administered during this visit. Current Outpatient Medications  Medication Sig Dispense Refill   amoxicillin -clavulanate (AUGMENTIN ) 875-125 MG tablet Take 1 tablet by mouth every 12 (twelve) hours. 60 tablet 0   busPIRone (BUSPAR) 15 MG tablet Take 15 mg by mouth.     escitalopram (LEXAPRO) 20 MG tablet Take 20 mg by mouth.     ibuprofen  (ADVIL ) 600 MG tablet Take 1 tablet (600 mg total) by mouth every 6 (six) hours as needed (mild pain). 30 tablet 0   ondansetron  (ZOFRAN ) 4 MG tablet Take 1 tablet (4 mg total) by mouth every 6 (six) hours as needed for nausea. 20 tablet 0   albuterol  (PROVENTIL ) (2.5 MG/3ML) 0.083% nebulizer solution Take 3 mLs (2.5 mg total) by nebulization every 6 (six) hours as needed for wheezing or shortness of breath. 75 mL 12   albuterol  (VENTOLIN  HFA) 108 (90 Base) MCG/ACT inhaler Inhale 2 puffs into the lungs every 6 (six) hours as needed for wheezing or  shortness of breath.     medroxyPROGESTERone (DEPO-PROVERA) 150 MG/ML injection Inject 150 mg into the muscle every 3 (three) months. (Patient not taking: Reported on 01/25/2024)     oxyCODONE  (OXY IR/ROXICODONE ) 5 MG immediate release tablet Take 1-2 tablets (5-10 mg total) by mouth every 4 (four) hours as needed for severe pain (pain score 7-10) or moderate pain (pain score 4-6). (Patient not taking: Reported on 02/10/2024) 20 tablet 0   No current facility-administered medications for this visit.    Allergies Patient has no known allergies.   Physical Exam:  BP 108/71   Pulse 90   Wt 132 lb 9.6 oz (60.1 kg)   BMI  23.49 kg/m  Body mass index is 23.49 kg/m. Weight last year: *** General appearance: Well nourished, well developed female in no acute distress.  Neck:  Supple, normal appearance, and no thyromegaly  Cardiovascular: normal s1 and s2.  No murmurs, rubs or gallops. Respiratory:  Clear to auscultation bilateral. Normal respiratory effort Abdomen: positive bowel sounds and no masses, hernias; diffusely non tender to palpation, non distended Breasts: {pe breast exam:315056::"breasts appear normal, no suspicious masses, no skin or nipple changes or axillary nodes"}. Neuro/Psych:  Normal mood and affect.  Skin:  Warm and dry.  Lymphatic:  No inguinal lymphadenopathy.   {Blank single:19197::"Cervical exam performed in the presence of a chaperone","Cervical exam deferred"} Pelvic exam: {ACTION; IS/IS ZOX:09604540} limited by body habitus EGBUS: within normal limits Vagina: within normal limits and with {no/min/mod:60509} blood or discharge in the vault Cervix: normal appearing cervix without tenderness, discharge or lesions. IUD strings *** Uterus:  {Desc; uterus-size & shape:16618} and non tender Adnexa:  {exam; adnexa:12223} Rectovaginal: ***  Laboratory: ***  Radiology: ***  Assessment: ***  Plan: *** 1. TOA (tubo-ovarian abscess) (Primary) ***  2. Abnormal chest  CT ***  3. Cervical cancer screening ***  No orders of the defined types were placed in this encounter.   RTC ***  No follow-ups on file.  Future Appointments  Date Time Provider Department Center  02/11/2024 11:15 AM Terre Ferri, MD [facility]    Tyler Gallant MD Attending [facility])

## 2024-02-11 ENCOUNTER — Ambulatory Visit (INDEPENDENT_AMBULATORY_CARE_PROVIDER_SITE_OTHER): Admitting: Infectious Diseases

## 2024-02-11 ENCOUNTER — Other Ambulatory Visit: Payer: Self-pay

## 2024-02-11 ENCOUNTER — Encounter: Payer: Self-pay | Admitting: Infectious Diseases

## 2024-02-11 ENCOUNTER — Encounter: Payer: Self-pay | Admitting: Obstetrics and Gynecology

## 2024-02-11 VITALS — BP 113/77 | HR 88 | Temp 98.7°F | Wt 131.6 lb

## 2024-02-11 DIAGNOSIS — Z79899 Other long term (current) drug therapy: Secondary | ICD-10-CM | POA: Diagnosis not present

## 2024-02-11 DIAGNOSIS — N7093 Salpingitis and oophoritis, unspecified: Secondary | ICD-10-CM | POA: Insufficient documentation

## 2024-02-11 NOTE — Progress Notes (Unsigned)
 Patient Active Problem List   Diagnosis Date Noted  . Medication management 01/25/2024  . Biological false positive RPR test 12/30/2023  . Abnormal chest CT 12/28/2023  . Depressive disorder 03/15/2023  . GAD (generalized anxiety disorder) 05/13/2022  . PTSD (post-traumatic stress disorder) 05/13/2022  . Leukopenia 01/07/2021  . Asthma 08/15/2019    Patient's Medications  New Prescriptions   No medications on file  Previous Medications   ALBUTEROL  (PROVENTIL ) (2.5 MG/3ML) 0.083% NEBULIZER SOLUTION    Take 3 mLs (2.5 mg total) by nebulization every 6 (six) hours as needed for wheezing or shortness of breath.   ALBUTEROL  (VENTOLIN  HFA) 108 (90 BASE) MCG/ACT INHALER    Inhale 2 puffs into the lungs every 6 (six) hours as needed for wheezing or shortness of breath.   BUSPIRONE (BUSPAR) 15 MG TABLET    Take 15 mg by mouth.   ESCITALOPRAM (LEXAPRO) 20 MG TABLET    Take 20 mg by mouth.   IBUPROFEN  (ADVIL ) 600 MG TABLET    Take 1 tablet (600 mg total) by mouth every 6 (six) hours as needed (mild pain).   MEDROXYPROGESTERONE (DEPO-PROVERA) 150 MG/ML INJECTION    Inject 150 mg into the muscle every 3 (three) months.   ONDANSETRON  (ZOFRAN ) 4 MG TABLET    Take 1 tablet (4 mg total) by mouth every 6 (six) hours as needed for nausea.   OXYCODONE  (OXY IR/ROXICODONE ) 5 MG IMMEDIATE RELEASE TABLET    Take 1-2 tablets (5-10 mg total) by mouth every 4 (four) hours as needed for severe pain (pain score 7-10) or moderate pain (pain score 4-6).  Modified Medications   No medications on file  Discontinued Medications   No medications on file    Subjective: 23 year old female G90P3003 with prior history of anemia, asthma, anxiety/depression, genital herpes who is here for HFU after recent hospital admission 3/30-4/7 for TOA. No IR drain placed due to no safe window.   Repeat CT on 4/6 with improving size of abscess. Initially on doxycycline , ceftriaxone  and metronidazole  which was eventually  transitioned to Po augmentin  and discharged on 4/7 with a 1 month supply.   4/28 Reports being compliant with Augmentin . Still has pain in the left side when she lies in the left side and when she lifts heavy. She has mild nausea sometimes but no vomiting or diarrhea. Having regular BMs. Appetite is OK. Seen by PCP 4/18. I told her she may not be quite ready to go back to work yet as she still has abdominal pain which gets worse when lifting groceries.    5/15 Still taking po augmentin  as she did not get the message and daughter had phone. Just knew yesterday. Saw Gyn yesterday and cramps on and off mild may be due to scar tissue.   Review of Systems: all systems reviewed with pertinent positives and negatives as listed above including GI.   Past Medical History:  Diagnosis Date  . Anemia   . Anxiety   . Asthma   . Chlamydia   . Depression   . Genital herpes   . Gonorrhea   . Ovarian cyst   . TOA (tubo-ovarian abscess) 12/28/2023   Past Surgical History:  Procedure Laterality Date  . OVARIAN CYST REMOVAL     Social History   Tobacco Use  . Smoking status: Former    Current packs/day: 0.00    Types: Cigarettes, Cigars    Quit date: 10/07/2018    Years since quitting: 5.3  .  Smokeless tobacco: Never  Vaping Use  . Vaping status: Some Days  Substance Use Topics  . Alcohol use: Yes    Comment: socially  . Drug use: No    Family History  Problem Relation Age of Onset  . Epilepsy Father     No Known Allergies  Health Maintenance  Topic Date Due  . HPV VACCINES (1 - 3-dose series) Never done  . Hepatitis C Screening  Never done  . DTaP/Tdap/Td (1 - Tdap) Never done  . Pneumococcal Vaccine 48-42 Years old (1 of 2 - PCV) Never done  . Cervical Cancer Screening (Pap smear)  Never done  . COVID-19 Vaccine (3 - Pfizer risk series) 08/31/2020  . INFLUENZA VACCINE  04/29/2024  . CHLAMYDIA SCREENING  12/26/2024  . HIV Screening  Completed  . Meningococcal B Vaccine  Aged  Out    Objective:  There were no vitals filed for this visit.  There is no height or weight on file to calculate BMI.  Physical Exam Constitutional:      Appearance: Normal appearance.  HENT:     Head: Normocephalic and atraumatic.      Mouth: Mucous membranes are moist.  Eyes:    Conjunctiva/sclera: Conjunctivae normal.     Pupils: Pupils are equal, round, and b/l symmetrical   Cardiovascular:     Rate and Rhythm: Normal rate and regular rhythm.     Heart sounds:  s1s2  Pulmonary:     Effort: Pulmonary effort is normal.     Breath sounds:   Abdominal:     General: Non distended     Palpations: soft. Non tender  Musculoskeletal:        General: Normal range of motion.   Skin:    General: Skin is warm and dry.     Comments:  Neurological:     General: grossly non focal     Mental Status: awake, alert and oriented to person, place, and time.   Psychiatric:        Mood and Affect: Mood normal.   Lab Results Lab Results  Component Value Date   WBC 3.3 (L) 01/25/2024   HGB 10.8 (L) 01/25/2024   HCT 35.6 01/25/2024   MCV 72.8 (L) 01/25/2024   PLT 190 01/25/2024    Lab Results  Component Value Date   CREATININE 0.79 01/25/2024   BUN 12 01/25/2024   NA 139 01/25/2024   K 3.7 01/25/2024   CL 105 01/25/2024   CO2 21 01/25/2024    Lab Results  Component Value Date   ALT 8 01/25/2024   AST 15 01/25/2024   ALKPHOS 37 (L) 01/03/2024   BILITOT 0.5 01/25/2024    No results found for: "CHOL", "HDL", "LDLCALC", "LDLDIRECT", "TRIG", "CHOLHDL" Lab Results  Component Value Date   LABRPR Reactive (A) 12/28/2023   No results found for: "HIV1RNAQUANT", "HIV1RNAVL", "CD4TABS"   Imaging CT abdomen pelvis 01/03/24 FINDINGS: Lower chest: Improvement in clustered left lower lobe opacity with mild residual. No pleural effusion.   Hepatobiliary: Scattered hypodensities in the liver, better appreciated on current exam, likely small hemangiomas.  Decompressed gallbladder. No biliary dilatation.   Pancreas: Unremarkable. No pancreatic ductal dilatation or surrounding inflammatory changes.   Spleen: Normal in size without focal abnormality.   Adrenals/Urinary Tract: Normal adrenal glands. Mild cortical scarring in the right kidney. No hydronephrosis. Suspected small left calyceal diverticulum containing a punctate stone. No renal inflammation. Unremarkable urinary bladder.   Stomach/Bowel: Bowel assessment is limited in the  absence of enteric contrast. No evidence of bowel obstruction or inflammation. Low lying cecum in the midline pelvis. Normal appendix visualized. Mixed liquid and solid stool in the colon.   Vascular/Lymphatic: Normal caliber abdominal aorta. Patent portal vein. No bulky abdominopelvic adenopathy.   Reproductive: Left adnexal fluid collection has diminished from prior exam, current residual serpiginous fluid-filled structure measures 4.2 x 2.5 cm, previously 6.5 x 2.9 cm. The degree of surrounding enhancement is diminished. The degree of pelvic free fluid has improved. No new adnexal mass or collection.   Other: Diminished pelvic free fluid. No free air. No abdominal wall hernia.   Musculoskeletal: No intramuscular fluid collection. There are no acute or suspicious osseous abnormalities.   IMPRESSION: 1. Left adnexal fluid collection has diminished from prior exam, currently 4.2 x 2.5 cm, previously 6.5 x 2.9 cm. The degree of surrounding enhancement is diminished. The degree of pelvic free fluid has improved. 2. Improvement in clustered left lower lobe opacity with mild residual. Assessment/Plan # Pyosalpinx/TOA - unable to be drained by IR - 4/6 CT abdomen Left adnexal fluid collection down to 4.2*2.5 cm   Plan - continue PO augmentin  as is, refills sent - CT abdomen pelvis w contrast in a week  - Fu in 2 weeks ( once CT done) - Fu with Gyn as planned   I have personally spent 33 minutes  involved in face-to-face and non-face-to-face activities for this patient on the day of the visit. Professional time spent includes the following activities: Preparing to see the patient (review of tests), Obtaining and/or reviewing separately obtained history (admission/discharge record), Performing a medically appropriate examination and/or evaluation , Ordering medications/tests/procedures, referring and communicating with other health care professionals, Documenting clinical information in the EMR, Independently interpreting results (not separately reported), Communicating results to the patient/family/caregiver, Counseling and educating the patient/family/caregiver and Care coordination (not separately reported).   Of note, portions of this note may have been created with voice recognition software. While this note has been edited for accuracy, occasional wrong-word or 'sound-a-like' substitutions may have occurred due to the inherent limitations of voice recognition software.   Melvina Stage, MD [facility] 02/11/2024, 10:48 AM

## 2024-02-14 ENCOUNTER — Other Ambulatory Visit: Payer: Self-pay

## 2024-02-14 ENCOUNTER — Encounter (HOSPITAL_COMMUNITY): Payer: Self-pay

## 2024-02-14 ENCOUNTER — Emergency Department (HOSPITAL_COMMUNITY)

## 2024-02-14 ENCOUNTER — Emergency Department (HOSPITAL_COMMUNITY)
Admission: EM | Admit: 2024-02-14 | Discharge: 2024-02-14 | Disposition: A | Discharge status: Home / Self Care | Attending: Emergency Medicine | Admitting: Emergency Medicine

## 2024-02-14 ENCOUNTER — Other Ambulatory Visit (HOSPITAL_COMMUNITY)

## 2024-02-14 DIAGNOSIS — R102 Pelvic and perineal pain: Secondary | ICD-10-CM | POA: Insufficient documentation

## 2024-02-14 LAB — COMPREHENSIVE METABOLIC PANEL WITH GFR
ALT: 11 U/L (ref 0–44)
AST: 18 U/L (ref 15–41)
Albumin: 3.4 g/dL — ABNORMAL LOW (ref 3.5–5.0)
Alkaline Phosphatase: 36 U/L — ABNORMAL LOW (ref 38–126)
Anion gap: 8 (ref 5–15)
BUN: 14 mg/dL (ref 6–20)
CO2: 22 mmol/L (ref 22–32)
Calcium: 9.1 mg/dL (ref 8.9–10.3)
Chloride: 105 mmol/L (ref 98–111)
Creatinine, Ser: 0.72 mg/dL (ref 0.44–1.00)
GFR, Estimated: >60 mL/min (ref >60)
Glucose, Bld: 91 mg/dL (ref 70–99)
Potassium: 3.6 mmol/L (ref 3.5–5.1)
Sodium: 135 mmol/L (ref 135–145)
Total Bilirubin: 0.5 mg/dL (ref 0.0–1.2)
Total Protein: 6.9 g/dL (ref 6.5–8.1)

## 2024-02-14 LAB — WET PREP, GENITAL
Clue Cells Wet Prep HPF POC: NONE SEEN
Sperm: NONE SEEN
Trich, Wet Prep: NONE SEEN
WBC, Wet Prep HPF POC: >=10 — AB (ref <10)
Yeast Wet Prep HPF POC: NONE SEEN

## 2024-02-14 LAB — CBC WITH DIFFERENTIAL/PLATELET
Abs Immature Granulocytes: 0.01 10*3/uL (ref 0.00–0.07)
Basophils Absolute: 0 10*3/uL (ref 0.0–0.1)
Basophils Relative: 1 %
Eosinophils Absolute: 0 10*3/uL (ref 0.0–0.5)
Eosinophils Relative: 1 %
HCT: 33.7 % — ABNORMAL LOW (ref 36.0–46.0)
Hemoglobin: 10.8 g/dL — ABNORMAL LOW (ref 12.0–15.0)
Immature Granulocytes: 0 %
Lymphocytes Relative: 41 %
Lymphs Abs: 1.8 10*3/uL (ref 0.7–4.0)
MCH: 22.8 pg — ABNORMAL LOW (ref 26.0–34.0)
MCHC: 32 g/dL (ref 30.0–36.0)
MCV: 71.2 fL — ABNORMAL LOW (ref 80.0–100.0)
Monocytes Absolute: 0.3 10*3/uL (ref 0.1–1.0)
Monocytes Relative: 7 %
Neutro Abs: 2.2 10*3/uL (ref 1.7–7.7)
Neutrophils Relative %: 50 %
Platelets: 179 10*3/uL (ref 150–400)
RBC: 4.73 MIL/uL (ref 3.87–5.11)
RDW: 14.4 % (ref 11.5–15.5)
WBC: 4.4 10*3/uL (ref 4.0–10.5)
nRBC: 0 % (ref 0.0–0.2)

## 2024-02-14 LAB — URINALYSIS, ROUTINE W REFLEX MICROSCOPIC
Bacteria, UA: NONE SEEN
Bilirubin Urine: NEGATIVE
Glucose, UA: NEGATIVE mg/dL
Ketones, ur: NEGATIVE mg/dL
Leukocytes,Ua: NEGATIVE
Nitrite: NEGATIVE
Protein, ur: NEGATIVE mg/dL
Specific Gravity, Urine: 1.02 (ref 1.005–1.030)
pH: 6 (ref 5.0–8.0)

## 2024-02-14 LAB — PREGNANCY, URINE: Preg Test, Ur: NEGATIVE

## 2024-02-14 MED ORDER — OXYCODONE-ACETAMINOPHEN 5-325 MG PO TABS: 1.0000 | ORAL_TABLET | Freq: Three times a day (TID) | ORAL | 0 refills | Status: DC | PRN

## 2024-02-14 MED ORDER — HYDROMORPHONE HCL 1 MG/ML IJ SOLN
1.0000 mg | Freq: Once | INTRAMUSCULAR | Status: AC
  Administered 2024-02-14: 1 mg via INTRAVENOUS
  Filled 2024-02-14: qty 1

## 2024-02-14 MED ORDER — LACTATED RINGERS IV BOLUS
1000.0000 mL | Freq: Once | INTRAVENOUS | Status: AC
Start: 2024-02-14 — End: 2024-02-14
  Administered 2024-02-14: 1000 mL via INTRAVENOUS

## 2024-02-14 NOTE — ED Triage Notes (Signed)
 BIBA from home patient reports having sex. All of a sudden her vagina and rectum began to hurt. Denies using foreign objects. H/o ovarian cysts denies any bleeding

## 2024-02-14 NOTE — ED Provider Notes (Signed)
 Laura [name] Provider Note   CSN: [id number] Arrival date & time: 02/14/24  0115     History  Chief Complaint  Patient presents with   Pelvic Pain    BIBA from home patient reports having sex. All of a sudden her vagina and rectum began to hurt. Denies using foreign objects. H/o ovarian cysts denies any bleeding    Laura [name] is a 23 y.o. female.  23 year old female who presents ER today secondary to vaginal pain during intercourse.  Patient states that she had a tubo-ovarian abscess and had not had intercourse for few months while that was healing.  She had intercourse for the first time tonight.  It was not particularly rough, no toys, no new partner no other real difference in after short period of time patient started having intense pain in her vagina that radiated towards her rectum.  She never anything like that before.  No fevers.  No drainage.  No vaginal bleeding.  No other associated symptoms.  She states it still hurts now.   Pelvic Pain       Home Medications Prior to Admission medications   Medication Sig Start Date End Date Taking? Authorizing Provider  oxyCODONE -acetaminophen  (PERCOCET) 5-325 MG tablet Take 1 tablet by mouth every 8 (eight) hours as needed for severe pain (pain score 7-10). 02/14/24  Yes Daisha Filosa, Reymundo Caulk, MD  albuterol  (PROVENTIL ) (2.5 MG/3ML) 0.083% nebulizer solution Take 3 mLs (2.5 mg total) by nebulization every 6 (six) hours as needed for wheezing or shortness of breath. 01/27/22   Roemhildt, Lorin T, PA-C  albuterol  (VENTOLIN  HFA) 108 (90 Base) MCG/ACT inhaler Inhale 2 puffs into the lungs every 6 (six) hours as needed for wheezing or shortness of breath.    [provider]  busPIRone (BUSPAR) 15 MG tablet Take 15 mg by mouth. 01/15/24   [provider]  escitalopram (LEXAPRO) 20 MG tablet Take 20 mg by mouth. 01/15/24   [provider]  ibuprofen  (ADVIL ) 600 MG tablet Take 1  tablet (600 mg total) by mouth every 6 (six) hours as needed (mild pain). 01/04/24   Abigail Abler, MD  medroxyPROGESTERone (DEPO-PROVERA) 150 MG/ML injection Inject 150 mg into the muscle every 3 (three) months. Patient not taking: Reported on 02/11/2024 07/13/23   [provider]  ondansetron  (ZOFRAN ) 4 MG tablet Take 1 tablet (4 mg total) by mouth every 6 (six) hours as needed for nausea. 01/04/24   Abigail Abler, MD  oxyCODONE  (OXY IR/ROXICODONE ) 5 MG immediate release tablet Take 1-2 tablets (5-10 mg total) by mouth every 4 (four) hours as needed for severe pain (pain score 7-10) or moderate pain (pain score 4-6). Patient not taking: Reported on 02/11/2024 01/04/24   Abigail Abler, MD      Allergies    Patient has no known allergies.    Review of Systems   Review of Systems  Genitourinary:  Positive for pelvic pain.    Physical Exam Updated Vital Signs BP 115/63   Pulse 65   Temp 98.5 F (36.9 C) (Oral)   Resp (!) 23   Ht 5\' 3"  (1.6 m)   Wt 59.4 kg   LMP 01/12/2024 (Approximate)   SpO2 100%   BMI 23.21 kg/m  Physical Exam Vitals and nursing note reviewed.  Constitutional:      Appearance: She is well-developed.  HENT:     Head: Normocephalic and atraumatic.  Eyes:     Pupils: Pupils are  equal, round, and reactive to light.  Cardiovascular:     Rate and Rhythm: Normal rate and regular rhythm.  Pulmonary:     Effort: No respiratory distress.     Breath sounds: No stridor.  Abdominal:     General: There is no distension.  Genitourinary:    Comments: Chaperoned by nurse Edwina Gram: small amount of bloody discharge in vault, cervix unremarkable, no lesions, no suprapubic pain, no obvious vaginal injury or masses palpable. Musculoskeletal:     Cervical back: Normal range of motion.  Neurological:     Mental Status: She is alert.     ED Results / Procedures / Treatments   Labs (all labs ordered are listed, but only abnormal results are displayed) Labs  Reviewed  WET PREP, GENITAL - Abnormal; Notable for the following components:      Result Value   WBC, Wet Prep HPF POC >=10 (*)    All other components within normal limits  CBC WITH DIFFERENTIAL/PLATELET - Abnormal; Notable for the following components:   Hemoglobin 10.8 (*)    HCT 33.7 (*)    MCV 71.2 (*)    MCH 22.8 (*)    All other components within normal limits  COMPREHENSIVE METABOLIC PANEL WITH GFR - Abnormal; Notable for the following components:   Albumin 3.4 (*)    Alkaline Phosphatase 36 (*)    All other components within normal limits  URINALYSIS, ROUTINE W REFLEX MICROSCOPIC - Abnormal; Notable for the following components:   Hgb urine dipstick LARGE (*)    All other components within normal limits  PREGNANCY, URINE  GC/CHLAMYDIA PROBE AMP ([place]) NOT AT [facility]    EKG None  Radiology US  PELVIC COMPLETE W TRANSVAGINAL AND TORSION R/O Result Date: 02/14/2024 CLINICAL DATA:  Pelvic pain EXAM: TRANSABDOMINAL AND TRANSVAGINAL ULTRASOUND OF PELVIS DOPPLER ULTRASOUND OF OVARIES TECHNIQUE: Both transabdominal and transvaginal ultrasound examinations of the pelvis were performed. Transabdominal technique was performed for global imaging of the pelvis including uterus, ovaries, adnexal regions, and pelvic cul-de-sac. It was necessary to proceed with endovaginal exam following the transabdominal exam to visualize the ovaries. Color and duplex Doppler ultrasound was utilized to evaluate blood flow to the ovaries. COMPARISON:  CT from 02/02/2024 FINDINGS: Uterus Measurements: 7.5 x 3.9 x 4.8 cm. = volume: 72 mL. No fibroids or other mass visualized. Endometrium Thickness: 4.7 mm. Minimal fluid is noted within the endometrium. Few areas of increased echogenicity are noted. It may represent small calcifications there are not well visualized on recent CT. Right ovary Measurements: 3.0 x 1.1 x 2.3 cm. = volume: 4.0 mL. Normal appearance/no adnexal mass. Left ovary Measurements: 4.9 x 2.7  x 3.6 cm. = volume: 25 mL. Small cystic lesion is noted improved when compared with prior CT in April as well as the most recent CT. Pulsed Doppler evaluation of both ovaries demonstrates normal low-resistance arterial and venous waveforms. Other findings No abnormal free fluid. IMPRESSION: No acute abnormality noted. Electronically Signed   By: Violeta Grey M.D.   On: 02/14/2024 03:42    Procedures Procedures    Medications Ordered in ED Medications  HYDROmorphone (DILAUDID) injection 1 mg (1 mg Intravenous Given 02/14/24 0142)  lactated ringers  bolus 1,000 mL (0 mLs Intravenous Stopped 02/14/24 0346)    ED Course/ Medical Decision Making/ A&P                                 Medical  Decision Making Amount and/or Complexity of Data Reviewed Labs: ordered. Radiology: ordered.  Risk Prescription drug management.   Us  for torsion/abscess and overall unremarkable. Pain improved and has been stable for multiple hours. Not sure on etiology, however I suggested no intercourse for the time being and fu w/ gynecology. If pain or new symptoms return prior to that, return to ED for reevaluation.   Final Clinical Impression(s) / ED Diagnoses Final diagnoses:  Pelvic pain in female    Rx / DC Orders ED Discharge Orders          Ordered    oxyCODONE -acetaminophen  (PERCOCET) 5-325 MG tablet  Every 8 hours PRN        02/14/24 0617              Lezette Kitts, Reymundo Caulk, MD 02/14/24 [phone]

## 2024-02-15 LAB — GC/CHLAMYDIA PROBE AMP (~~LOC~~) NOT AT ARMC
Chlamydia: NEGATIVE
Comment: NEGATIVE
Comment: NORMAL
Neisseria Gonorrhea: NEGATIVE

## 2024-03-17 ENCOUNTER — Ambulatory Visit (HOSPITAL_COMMUNITY): Payer: Self-pay

## 2024-04-11 NOTE — Progress Notes (Signed)
 Patient ID: Laura [name] is a 23 y.o. female.  Allergies[1]  Problem List[2]   Chief Complaint  Patient presents with  . Morning Sickness    Pt reports having nauseous x 1 week. At home preg test today showed positive results. Pt concern due to abscess on fallopian tube in March/April. Has not contacted OBGYN office. [facility].      History of Present Illness: History of Present Illness This is a female with a history of an abscess on her fallopian tube presenting with a positive pregnancy test, abdominal pain, and vaginal discharge.  The patient conducted a home pregnancy test this morning, which yielded a positive result. Her last menstrual cycle was approximately a month ago. She has been experiencing fatigue and occasional vomiting, particularly when brushing her teeth in the morning. She also reports feeling nauseous at work, necessitating periods of rest. This made her suspect she could be pregnant which is why she took an at home pregnancy test. She has not yet informed her OB/GYN about the positive pregnancy test. This is not her first pregnancy as she has 3 children at home. She has no history of miscarriages, abortions, or ectopic pregnancies.   The pt wanted to confirm her pregnancy before seeing her OBGYN.   The patient reports increased urinary frequency and suspects a yeast infection due to an unusual odor, although it is not fishy. She has noticed white discharge, which she considers normal for her, and reports no increase in volume or itching. She is sexually active with one partner and they do not use protection. Her partner is asymptomatic. She is interested in bacterial vaginosis testing today. Declines STI testing.   The patient has been experiencing intermittent right sided abdominal pain for the past month. She mentions that in the past she has had severe pain on her left side which resulted in a diagnosis of TOA with admission in  March. She was followed by both OBGYN and ID; had a clear CT A/P 05/06 and finished abx. She has not mentioned the right sided pain to them for the past month however is concerned as she knows that it can cause ectopic pregnancies. Denies severe pain, vaginal spotting/bleeding, fevers, chills, or any other associated symptoms.     Review of Systems: All others reviewed and negative except as listed above.  Objective: Vitals:   04/11/24 1359  BP: 115/81  Pulse: 98  Resp: 18  Temp: 98.7 F (37.1 C)  TempSrc: Oral  SpO2: 100%  Weight: 59 kg (130 lb)  Height: 1.6 m (5' 3)     Physical Exam: Physical Exam Vitals and nursing note reviewed.  Constitutional:      General: She is not in acute distress.    Appearance: Normal appearance. She is not toxic-appearing.  HENT:     Head: Normocephalic and atraumatic.     Nose: Nose normal.     Mouth/Throat:     Mouth: Mucous membranes are moist.   Eyes:     Extraocular Movements: Extraocular movements intact.     Conjunctiva/sclera: Conjunctivae normal.    Cardiovascular:     Rate and Rhythm: Normal rate.  Pulmonary:     Effort: Pulmonary effort is normal.  Abdominal:     General: Abdomen is flat.     Palpations: Abdomen is soft.     Tenderness: There is abdominal tenderness. There is no right CVA tenderness, left CVA tenderness, guarding or rebound.     Comments: Very  mild RLQ abd TTP. No rebound or guarding.    Musculoskeletal:        General: Normal range of motion.     Cervical back: Normal range of motion.   Skin:    General: Skin is warm and dry.     Findings: No rash.   Neurological:     Mental Status: She is alert. Mental status is at baseline.   Psychiatric:        Mood and Affect: Mood normal.        Behavior: Behavior normal.      Results for orders placed or performed in visit on 04/11/24  POC HCG Qualitative, Urine  Result Value Ref Range   HCG, Urine, POC Positive (A) Negative   Internal Control  Acceptable    Kit/Device Lot # [id number]    Kit/Device Expiration Date 113026   POC Urinalysis Auto without Microscopic  Result Value Ref Range   Color, Urine Yellow Yellow   Clarity, Urine Clear Clear   Glucose, Urine Negative Negative mg/dL   Bilirubin, Urine Negative Negative   Ketones, Urine Negative Negative mg/dL   Specific Gravity, Urine 1.025 1.010, 1.015, 1.020, 1.025   Blood, Urine Negative Negative   pH, Urine 6.0 5.0, 5.5, 6.0, 6.5, 7.0, 7.5, 8.0   Protein, Urine Negative Negative mg/dL   Urobilinogen, Urine Negative <2.0 mg/dL   Nitrite, Urine Negative Negative   Leukocyte Esterase, Urine Negative Negative   Kit/Device Lot # [id number]    Kit/Device Expiration Date 63026      Assessment: Laura [name] was seen today for morning sickness.  Diagnoses and all orders for this visit:  Missed period -     POC HCG Qualitative, Urine -     POC Urinalysis Auto without Microscopic  Vaginal discharge -     POC Urinalysis Auto without Microscopic -     Bacterial Vaginosis (BV), Candida, and Trichomonas via NAA (Swab)     Assessment & Plan Presenting to urgent care today with complaint of positive pregnancy test at home.  Last menstrual cycle approximately 1 month ago however she is having unprotected sex with partner.  She took an at home pregnancy test today which was positive.  She has not yet seen her OB/GYN for same.  On arrival to urgent care vitals are stable.  She is afebrile, nontachycardic and nontachypneic and appears to be in no acute distress.  Urine pregnancy test returned positive today.  On physical exam she does report that she has been having some mild right lower quadrant abdominal pain for the past month.  She does note she is concerned that this could be a TOA as she has a history of TOA on left side in March requiring admission/hospitalization.  She had clear CT abdomen and pelvis in May and has not seen her OBGYN/ID since then.  She denies any concerning features  including fevers, vomiting, severe pain, vaginal bleeding/spotting.  She does mention she has had some urinary frequency as of late as well as vaginal discharge. Question if UTI versus BV/yeast could be causing some pain. Pt is not concerned regarding STIs as she is in a monogamous relationship. She has no CVA tenderness palpation on exam at this time.  Difficult to say why she has been having some mild right lower quadrant abdominal pain I have very low suspicion for acute appendicitis given has been ongoing for approximately 1 month.  Very low concern for ectopic pregnancy given the pain has been ongoing for months  and her last menstrual cycle was a month ago however I do recommend that she follow-up with her OB/GYN as soon as possible for further evaluation.  Given no fevers and no severe abdominal pain I have low concern for TOA. I do recommend close follow up with OBGYN however pt has been counseled that if she is unable to see OBGYN in a timely manner (1-2 days) she should seek further evaluation at the MAU given she is pregnant and having abdominal pain as we cannot rule out ectopic here or new TOA. Pt in agreement with plan.   1. Pregnancy: Confirmed. - Contact OB/GYN for further evaluation in 1-2 days. - Visit [facility] if OB/GYN cannot see within 1-2 days  2. Abdominal pain: - Ultrasound needed for further investigation. - Contact OB/GYN for evaluation or go to MAU  3. Yeast infection: Suspected. - Swab for BV and yeast infection. - Urine sample to rule out UTI. - U/A clear at this time; will await vaginal swab  Follow-up - Contact OB/GYN to inform of positive pregnancy test and schedule an appointment. - Visit [facility] if OB/GYN cannot see within 1-2 days    Plan: Home Care    Symptomatic management discussed.  Patient was given verbal and written instructions on symptoms that necessitate return to the UC/ED, and instructed to f/u w/ UC or PCP if not improving in  expected timeframe.   Patient/parent has been instructed on RX/OTC medications, dosages, side effects, and possible interactions as associated with each diagnosis in my impression and plan above.   Patient education (verbal/handout) given on diagnosis, pathophysiology, treatment of diagnosis, side effects of medication use for treatment, restrictions while taking medication, supportives measures such as staying hydrated.   Red Flags associated with diagnosis/es were reviewed and patient instructed on action plan if red flags develop.   They have been instructed that if symptoms worsen or red flags develop they should return to Urgent Care, go to the nearest ED, or activate EMS/911.     Patient and/or parent/guardian (if applicable) agreed with plan and voiced understanding.  No barriers to adherence perceived by myself.  During this patient encounter if the patient presented with respiratory complaints and any concern for possible COVID, the patient was wearing a mask. In these cases, throughout the encounter, I was wearing at least a surgical mask as well myself.     Portions of this note may have been dictated using Dragon dictation software/hardware and may contain grammatical or spelling errors.   Electronically signed by Morgan Browns  Mon 04/11/2024 2:45 PM         [1] No Known Allergies [2] Patient Active Problem List Diagnosis  . Asthma  . Acute cystitis  . Anemia  . Leukopenia  . Iron deficiency anemia due to chronic blood loss

## 2024-04-11 NOTE — Patient Instructions (Signed)
 Your pregnancy test is positive today. We have collected a vaginal swab to assess for BV/yeast and will contact you if it is positive. Given you have been having some abdominal pain with a positive pregnancy test it is strongly recommended that you be evaluated by your OBGYN in 1-2 days as they may need to get an ultrasound for further evaluation. If they are unable to see you in a timely manner or you develop worsening abdominal pain, fevers, passing out, vaginal bleeding, or any other new/concerning symptoms go directly to the [facility] for further evaluation.

## 2024-04-12 ENCOUNTER — Other Ambulatory Visit: Payer: Self-pay

## 2024-04-12 ENCOUNTER — Telehealth: Payer: Self-pay | Admitting: Family Medicine

## 2024-04-12 DIAGNOSIS — Z3201 Encounter for pregnancy test, result positive: Secondary | ICD-10-CM

## 2024-04-12 NOTE — Telephone Encounter (Signed)
 After chart review, pt had positive UPT with [facility] on 04/11/24.  Called pt to schedule dating ultrasound since she reports being unsure of LMP.  No answer, left voicemail for pt to call office to be scheduled.  Dating US  order entered.    Waddell, RN

## 2024-04-12 NOTE — Telephone Encounter (Signed)
 Called patient to schedule appointments to start prenatal care. Patient is unsure of her LMP so she will need a dating US  so that we can make sure we are scheduling her correctly.

## 2024-04-12 NOTE — Progress Notes (Signed)
 Orders only encounter.  Dating US  ordered.    Waddell, RN

## 2024-04-14 ENCOUNTER — Inpatient Hospital Stay (HOSPITAL_COMMUNITY)
Admission: AD | Admit: 2024-04-14 | Discharge: 2024-04-14 | Disposition: A | Discharge status: Home / Self Care | Attending: Obstetrics and Gynecology | Admitting: Obstetrics and Gynecology

## 2024-04-14 ENCOUNTER — Encounter (HOSPITAL_COMMUNITY): Payer: Self-pay | Admitting: Obstetrics and Gynecology

## 2024-04-14 ENCOUNTER — Inpatient Hospital Stay (HOSPITAL_COMMUNITY)

## 2024-04-14 DIAGNOSIS — O26891 Other specified pregnancy related conditions, first trimester: Secondary | ICD-10-CM | POA: Diagnosis present

## 2024-04-14 DIAGNOSIS — Z3491 Encounter for supervision of normal pregnancy, unspecified, first trimester: Secondary | ICD-10-CM

## 2024-04-14 DIAGNOSIS — R109 Unspecified abdominal pain: Secondary | ICD-10-CM | POA: Insufficient documentation

## 2024-04-14 DIAGNOSIS — Z3A00 Weeks of gestation of pregnancy not specified: Secondary | ICD-10-CM | POA: Diagnosis not present

## 2024-04-14 DIAGNOSIS — O3680X0 Pregnancy with inconclusive fetal viability, not applicable or unspecified: Secondary | ICD-10-CM | POA: Diagnosis not present

## 2024-04-14 DIAGNOSIS — O26899 Other specified pregnancy related conditions, unspecified trimester: Secondary | ICD-10-CM

## 2024-04-14 HISTORY — DX: Acute parametritis and pelvic cellulitis: N73.0

## 2024-04-14 HISTORY — DX: Urinary tract infection, site not specified: N39.0

## 2024-04-14 LAB — CBC
HCT: 33.7 % — ABNORMAL LOW (ref 36.0–46.0)
Hemoglobin: 10.7 g/dL — ABNORMAL LOW (ref 12.0–15.0)
MCH: 22.8 pg — ABNORMAL LOW (ref 26.0–34.0)
MCHC: 31.8 g/dL (ref 30.0–36.0)
MCV: 71.7 fL — ABNORMAL LOW (ref 80.0–100.0)
Platelets: 164 K/uL (ref 150–400)
RBC: 4.7 MIL/uL (ref 3.87–5.11)
RDW: 13.5 % (ref 11.5–15.5)
WBC: 2.9 K/uL — ABNORMAL LOW (ref 4.0–10.5)
nRBC: 0 % (ref 0.0–0.2)

## 2024-04-14 LAB — URINALYSIS, ROUTINE W REFLEX MICROSCOPIC
Bilirubin Urine: NEGATIVE
Glucose, UA: NEGATIVE mg/dL
Hgb urine dipstick: NEGATIVE
Ketones, ur: NEGATIVE mg/dL
Leukocytes,Ua: NEGATIVE
Nitrite: NEGATIVE
Protein, ur: NEGATIVE mg/dL
Specific Gravity, Urine: 1.023 (ref 1.005–1.030)
pH: 5 (ref 5.0–8.0)

## 2024-04-14 LAB — WET PREP, GENITAL
Clue Cells Wet Prep HPF POC: NONE SEEN
Trich, Wet Prep: NONE SEEN
WBC, Wet Prep HPF POC: <10 (ref <10)
Yeast Wet Prep HPF POC: NONE SEEN

## 2024-04-14 LAB — HCG, QUANTITATIVE, PREGNANCY: hCG, Beta Chain, Quant, S: 575 m[IU]/mL — ABNORMAL HIGH (ref <5)

## 2024-04-14 NOTE — MAU Provider Note (Signed)
 History     CSN: [id number]  Arrival date and time: 04/14/24 0757   None     Chief Complaint  Patient presents with   Abdominal Pain   HPI Patient presents to the MAU today for cramping and right-sided abdominal pain.  She has history of a TOA earlier this year so she was concerned that she is having another 1.  She took a pregnancy test a few days ago that came back positive.  She is unsure about her last period.  Denies any vaginal bleeding.  Denies any fever, chills, nausea, vomiting.  History of 3 SVD's.  OB History     Gravida  4   Para  3   Term  3   Preterm      AB      Living  3      SAB      IAB      Ectopic      Multiple      Live Births  3           Past Medical History:  Diagnosis Date   Anemia    Anxiety    Asthma    Chlamydia    Depression    Genital herpes    Gonorrhea    Ovarian cyst    PID (acute pelvic inflammatory disease)    TOA (tubo-ovarian abscess) 12/28/2023   UTI (urinary tract infection)     Past Surgical History:  Procedure Laterality Date   OVARIAN CYST REMOVAL Left 2020    Family History  Problem Relation Age of Onset   Asthma Mother    Epilepsy Father     Social History   Tobacco Use   Smoking status: Never   Smokeless tobacco: Never  Vaping Use   Vaping status: Former  Substance Use Topics   Alcohol use: Not Currently    Comment: socially   Drug use: Not Currently    Types: Marijuana    Comment: 2018    Allergies: No Known Allergies  Medications Prior to Admission  Medication Sig Dispense Refill Last Dose/Taking   albuterol  (VENTOLIN  HFA) 108 (90 Base) MCG/ACT inhaler Inhale 2 puffs into the lungs every 6 (six) hours as needed for wheezing or shortness of breath.   Past Week   bismuth subsalicylate (PEPTO BISMOL) 262 MG/15ML suspension Take 30 mLs by mouth every 6 (six) hours as needed.   Past Week   albuterol  (PROVENTIL ) (2.5 MG/3ML) 0.083% nebulizer solution Take 3 mLs (2.5 mg total) by  nebulization every 6 (six) hours as needed for wheezing or shortness of breath. 75 mL 12 More than a month   busPIRone (BUSPAR) 15 MG tablet Take 15 mg by mouth.   04/12/2024   escitalopram (LEXAPRO) 20 MG tablet Take 20 mg by mouth.   04/12/2024   ibuprofen  (ADVIL ) 600 MG tablet Take 1 tablet (600 mg total) by mouth every 6 (six) hours as needed (mild pain). 30 tablet 0 More than a month   medroxyPROGESTERone (DEPO-PROVERA) 150 MG/ML injection Inject 150 mg into the muscle every 3 (three) months. (Patient not taking: Reported on 02/11/2024)   Not Taking   ondansetron  (ZOFRAN ) 4 MG tablet Take 1 tablet (4 mg total) by mouth every 6 (six) hours as needed for nausea. 20 tablet 0    oxyCODONE  (OXY IR/ROXICODONE ) 5 MG immediate release tablet Take 1-2 tablets (5-10 mg total) by mouth every 4 (four) hours as needed for severe pain (pain score 7-10) or moderate  pain (pain score 4-6). (Patient not taking: Reported on 02/11/2024) 20 tablet 0    oxyCODONE -acetaminophen  (PERCOCET) 5-325 MG tablet Take 1 tablet by mouth every 8 (eight) hours as needed for severe pain (pain score 7-10). 10 tablet 0     Review of Systems  Gastrointestinal:  Positive for abdominal pain. Negative for constipation, diarrhea, nausea and vomiting.  Genitourinary:  Negative for vaginal bleeding.  All other systems reviewed and are negative.  Physical Exam   Blood pressure 115/70, pulse 92, temperature 98.7 F (37.1 C), temperature source Oral, resp. rate 16, height 5' 3 (1.6 m), weight 61.6 kg, SpO2 100%.  Physical Exam Vitals and nursing note reviewed.  Constitutional:      Appearance: Normal appearance.  HENT:     Head: Normocephalic and atraumatic.     Nose: No congestion or rhinorrhea.  Eyes:     Extraocular Movements: Extraocular movements intact.  Cardiovascular:     Rate and Rhythm: Normal rate.  Pulmonary:     Effort: Pulmonary effort is normal.  Abdominal:     Palpations: Abdomen is soft.     Tenderness: There  is no abdominal tenderness.  Musculoskeletal:        General: Normal range of motion.     Cervical back: Normal range of motion.  Skin:    General: Skin is warm.     Capillary Refill: Capillary refill takes less than 2 seconds.  Neurological:     General: No focal deficit present.     Mental Status: She is alert.     Cranial Nerves: No cranial nerve deficit.  Psychiatric:        Mood and Affect: Mood normal.        Behavior: Behavior normal.     MAU Course  Procedures  MDM CBC Ultrasound hCG quant   Assessment and Plan  Laura [name] is a 23 year old G4, P3 at unknown gestation presenting for abdominal pain  Pregnancy of unknown location Abdominal pain Patient with 2-day history of abdominal pain but not currently experiencing it.  History of TOA earlier this year.  CBC showed hemoglobin of 10.7 which was 10.8 at previous check.  hCG 575.  Ultrasound showing no IUP or adnexal mass but did show abnormally thickened endometrium.  Discussed results with patient and recommend 48-hour hCG and viability scan in 14 days.  Discussed strict ectopic precautions.  No further questions or concerns.  Patient will return in 48 hours for hCG levels.  Kimber Fritts V Oronde Hallenbeck 04/14/2024, 10:22 AM

## 2024-04-14 NOTE — MAU Note (Signed)
 Laura [name] is a 23 y.o. at Unknown here in MAU reporting: has been cramping last 2 days.  Some dull pain on rt side.  Hx of TOA earlier this year, knows that puts her at risk for ectopic. Denies any bleeding.  LMP: unsure maybe end of May, reports hx of irreg cycles Onset of complaint: 2 days ago Pain score: moderate Vitals:   04/14/24 0823  BP: 115/70  Pulse: 92  Resp: 16  Temp: 98.7 F (37.1 C)  SpO2: 100%     Lab orders placed from triage:  vag swabs and UA

## 2024-04-14 NOTE — Discharge Instructions (Signed)
 It was a pleasure taking care of you today.  Your ultrasound did not show a fetus in your uterus but it may just be too early.  Because we cannot see anything in your uterus we do recommend repeat lab work in 48 hours and repeat ultrasound in 10 to 14 days.  It looks like you are already scheduled for an ultrasound on 7/29 so just go to that appointment.  Regarding the lab work you will need to return here on Saturday morning for repeat lab work.  If your pain worsens or you have any other concerns please return immediately for further evaluation.  I hope you have a great day!

## 2024-04-15 LAB — GC/CHLAMYDIA PROBE AMP (~~LOC~~) NOT AT ARMC
Chlamydia: NEGATIVE
Comment: NEGATIVE
Comment: NORMAL
Neisseria Gonorrhea: NEGATIVE

## 2024-04-16 ENCOUNTER — Other Ambulatory Visit (HOSPITAL_BASED_OUTPATIENT_CLINIC_OR_DEPARTMENT_OTHER): Payer: Self-pay

## 2024-04-16 ENCOUNTER — Inpatient Hospital Stay (HOSPITAL_COMMUNITY)
Admission: AD | Admit: 2024-04-16 | Discharge: 2024-04-16 | Disposition: A | Discharge status: Home / Self Care | Attending: Obstetrics and Gynecology | Admitting: Obstetrics and Gynecology

## 2024-04-16 ENCOUNTER — Inpatient Hospital Stay (HOSPITAL_COMMUNITY): Admission: RE | Admit: 2024-04-16 | Source: Home / Self Care | Admitting: Obstetrics and Gynecology

## 2024-04-16 ENCOUNTER — Other Ambulatory Visit (HOSPITAL_COMMUNITY)
Admission: AD | Admit: 2024-04-16 | Discharge: 2024-04-16 | Disposition: A | Discharge status: Home / Self Care | Source: Home / Self Care | Attending: Obstetrics and Gynecology | Admitting: Obstetrics and Gynecology

## 2024-04-16 DIAGNOSIS — Z3A00 Weeks of gestation of pregnancy not specified: Secondary | ICD-10-CM | POA: Diagnosis not present

## 2024-04-16 DIAGNOSIS — O3680X0 Pregnancy with inconclusive fetal viability, not applicable or unspecified: Secondary | ICD-10-CM | POA: Diagnosis present

## 2024-04-16 LAB — HCG, QUANTITATIVE, PREGNANCY: hCG, Beta Chain, Quant, S: 2096 m[IU]/mL — ABNORMAL HIGH (ref <5)

## 2024-04-16 MED ORDER — PRENATAL PLUS 27-1 MG PO TABS
1.0000 | ORAL_TABLET | Freq: Every day | ORAL | 0 refills | Status: AC
  Filled 2024-04-16: qty 30, 30d supply, fill #0

## 2024-04-16 NOTE — MAU Provider Note (Signed)
 hCG check    S [name] is a 23 y.o. [phone] pregnant female who presents to MAU today with complaint of needing repeat hCG level. Seen on 7/17 for RLQ abdominal cramping in s/o hx of left sided TOA earlier this year.  Today she denies any abdominal pain or VB.    Pertinent items noted in HPI and remainder of comprehensive ROS otherwise negative.   O BP 116/71 (BP Location: Right Arm)   Pulse 86   Temp 98.8 F (37.1 C) (Oral)   Resp 17   Ht 5' 3 (1.6 m)   Wt 61.6 kg   LMP  (LMP Unknown)   SpO2 100%   BMI 24.07 kg/m  Physical Exam Vitals and nursing note reviewed.  Constitutional:      General: She is not in acute distress.    Appearance: Normal appearance. She is not ill-appearing.  HENT:     Head: Normocephalic and atraumatic.     Right Ear: External ear normal.     Left Ear: External ear normal.     Nose: Nose normal. No congestion or rhinorrhea.     Mouth/Throat:     Mouth: Mucous membranes are moist.     Pharynx: Oropharynx is clear.  Eyes:     Extraocular Movements: Extraocular movements intact.     Conjunctiva/sclera: Conjunctivae normal.  Cardiovascular:     Rate and Rhythm: Normal rate.  Pulmonary:     Effort: Pulmonary effort is normal. No respiratory distress.  Abdominal:     General: Abdomen is flat. There is no distension.     Palpations: Abdomen is soft.     Tenderness: There is no abdominal tenderness.  Musculoskeletal:        General: No swelling. Normal range of motion.     Cervical back: Normal range of motion.  Skin:    General: Skin is warm and dry.  Neurological:     Mental Status: She is alert and oriented to person, place, and time. Mental status is at baseline.     Motor: No weakness.     Gait: Gait normal.  Psychiatric:        Mood and Affect: Mood normal.        Behavior: Behavior normal.      MDM: MAU Course: 7/17 = no IUP, no obvious adnexal mass, pregnancy of unknown location with mildly thickened endometrium with  central hypoechoic debris/blood products  hCG 575 > 2096  AP #Pregnancy of Unknown location - hCG level today rising appropriately - previously ordered repeat US  in 10-14 days on 7/17  Discharge from MAU in stable condition with strict/usual precautions Follow up at desired OBGYN as scheduled for ongoing prenatal care  Allergies as of 04/16/2024   No Known Allergies      Medication List     STOP taking these medications    ibuprofen  600 MG tablet Commonly known as: ADVIL    oxyCODONE -acetaminophen  5-325 MG tablet Commonly known as: Percocet       TAKE these medications    albuterol  108 (90 Base) MCG/ACT inhaler Commonly known as: VENTOLIN  HFA Inhale 2 puffs into the lungs every 6 (six) hours as needed for wheezing or shortness of breath.   albuterol  (2.5 MG/3ML) 0.083% nebulizer solution Commonly known as: PROVENTIL  Take 3 mLs (2.5 mg total) by nebulization every 6 (six) hours as needed for wheezing or shortness of breath.   bismuth subsalicylate 262 MG/15ML suspension Commonly known as: PEPTO BISMOL Take 30 mLs by  mouth every 6 (six) hours as needed.   busPIRone 15 MG tablet Commonly known as: BUSPAR Take 15 mg by mouth.   escitalopram 20 MG tablet Commonly known as: LEXAPRO Take 20 mg by mouth.   ondansetron  4 MG tablet Commonly known as: ZOFRAN  Take 1 tablet (4 mg total) by mouth every 6 (six) hours as needed for nausea.   prenatal vitamin w/FE, FA 27-1 MG Tabs tablet Take 1 tablet by mouth daily at 12 noon.        Jhonny Augustin BROCKS, MD 04/16/2024 9:20 AM

## 2024-04-16 NOTE — Discharge Instructions (Signed)

## 2024-04-16 NOTE — MAU Note (Addendum)
..  Laura [name] is a 23 y.o. at Unknown here in MAU reporting: here for repeat HCG. Was seen in MAU on 7/17 for lower abdominal cramping. Has a hx of left TOA earlier this year. She reports her pain was primary on her right side and comes and goes. Denies pain or vaginal bleeding currently.   Last hcg- 575  Pain score: 0 Vitals:   04/16/24 0741  BP: 116/71  Pulse: 86  Resp: 17  Temp: 98.8 F (37.1 C)  SpO2: 100%      Lab orders placed from triage:   hcg

## 2024-04-26 ENCOUNTER — Other Ambulatory Visit: Payer: Self-pay

## 2024-04-26 ENCOUNTER — Ambulatory Visit

## 2024-04-26 DIAGNOSIS — Z3A01 Less than 8 weeks gestation of pregnancy: Secondary | ICD-10-CM | POA: Diagnosis not present

## 2024-04-26 DIAGNOSIS — Z3491 Encounter for supervision of normal pregnancy, unspecified, first trimester: Secondary | ICD-10-CM

## 2024-04-26 DIAGNOSIS — Z3201 Encounter for pregnancy test, result positive: Secondary | ICD-10-CM

## 2024-04-27 ENCOUNTER — Encounter: Payer: Self-pay | Admitting: Obstetrics and Gynecology

## 2024-04-27 ENCOUNTER — Telehealth: Admitting: Family Medicine

## 2024-04-27 DIAGNOSIS — Z3490 Encounter for supervision of normal pregnancy, unspecified, unspecified trimester: Secondary | ICD-10-CM

## 2024-04-27 DIAGNOSIS — R110 Nausea: Secondary | ICD-10-CM

## 2024-04-27 NOTE — Progress Notes (Signed)
 For the safety of you and your child, I recommend a face to face office visit with a health care provider.  Many mothers need to take medicines during their pregnancy and while nursing.  Almost all medicines pass into the breast milk in small quantities.  Most are generally considered safe for a mother to take but some medicines must be avoided.  After reviewing your E-Visit request, I recommend that you consult your OB/GYN or pediatrician for medical advice in relation to your condition and prescription medications while pregnant or breastfeeding.  NOTE: There will be NO CHARGE for this E-Visit   If you are having a true medical emergency, please call 911.     For an urgent face to face visit, [facility] has multiple urgent care centers for your convenience.  Click the link below for the full list of locations and hours, walk-in wait times, appointment scheduling options and driving directions:  Urgent Care - [place], [place], [place], [place], [place], [place]  [place]     Your MyChart E-visit questionnaire answers were reviewed by a board certified advanced clinical practitioner to complete your personal care plan based on your specific symptoms.  Thank you for using e-Visits.       have provided 5 minutes of non face to face time during this encounter for chart review and documentation.

## 2024-05-10 ENCOUNTER — Ambulatory Visit: Payer: Self-pay | Admitting: Family Medicine

## 2024-05-17 ENCOUNTER — Telehealth (INDEPENDENT_AMBULATORY_CARE_PROVIDER_SITE_OTHER)

## 2024-05-17 DIAGNOSIS — J45909 Unspecified asthma, uncomplicated: Secondary | ICD-10-CM

## 2024-05-17 DIAGNOSIS — O219 Vomiting of pregnancy, unspecified: Secondary | ICD-10-CM | POA: Diagnosis not present

## 2024-05-17 DIAGNOSIS — Z3A09 9 weeks gestation of pregnancy: Secondary | ICD-10-CM | POA: Diagnosis not present

## 2024-05-17 DIAGNOSIS — O99511 Diseases of the respiratory system complicating pregnancy, first trimester: Secondary | ICD-10-CM

## 2024-05-17 DIAGNOSIS — O0992 Supervision of high risk pregnancy, unspecified, second trimester: Secondary | ICD-10-CM | POA: Insufficient documentation

## 2024-05-17 DIAGNOSIS — Z3490 Encounter for supervision of normal pregnancy, unspecified, unspecified trimester: Secondary | ICD-10-CM | POA: Insufficient documentation

## 2024-05-17 MED ORDER — DOXYLAMINE-PYRIDOXINE 10-10 MG PO TBEC: 2.0000 | DELAYED_RELEASE_TABLET | Freq: Every day | ORAL | 2 refills | Status: DC

## 2024-05-17 MED ORDER — PROMETHAZINE HCL 25 MG PO TABS: 25.0000 mg | ORAL_TABLET | Freq: Four times a day (QID) | ORAL | 0 refills | Status: DC | PRN

## 2024-05-17 NOTE — Addendum Note (Signed)
 Addended by: HONORE MILLMAN E on: 05/17/2024 11:02 AM   Modules accepted: Orders

## 2024-05-17 NOTE — Progress Notes (Signed)
 New OB Intake  I connected with Laura [name]  on 05/17/24 at  8:15 AM EDT by MyChart Video Visit and verified that I am speaking with the correct person using two identifiers. Nurse is located at [employer] and pt is located at work.  I discussed the limitations, risks, security and privacy concerns of performing an evaluation and management service by telephone and the availability of in person appointments. I also discussed with the patient that there may be a patient responsible charge related to this service. The patient expressed understanding and agreed to proceed.  I explained I am completing New OB Intake today. We discussed EDD of 12/19/24 based on US  at [redacted]w[redacted]d. Pt is G4P3003. I reviewed her allergies, medications and Medical/Surgical/OB history.    Patient Active Problem List   Diagnosis Date Noted   Supervision of low-risk pregnancy 05/17/2024   TOA (tubo-ovarian abscess) 02/11/2024   Biological false positive RPR test 12/30/2023   Abnormal chest CT 12/28/2023   Depressive disorder 03/15/2023   GAD (generalized anxiety disorder) 05/13/2022   PTSD (post-traumatic stress disorder) 05/13/2022   Leukopenia 01/07/2021   Asthma 08/15/2019     Concerns addressed today Nausea/vomiting: Reports feeling nauseas constantly throughout the day, vomiting 6-7x/day. Has tried Zofran  this pregnancy without relief, even though this has worked during past pregnancies. Diclegis  and Phenergan  ordered for patient to try. Notices right upper abdominal pain following vomiting, no association with food intake. Will follow up at new OB.  Anemia: Previous history of anemia. Remembers having a blood transfusion during pregnancy but not at time of delivery Work accommodations: Concerned about exposure to chemicals while pregnant. Working at a [employer]. Plan to locate Safety Data Sheet for her workplace and send over MyChart for provider to review. Also plans to discuss work accomodation options  with her job prior to new OB.  Delivery Plans Plans to deliver at [facility]. Discussed the nature of our practice with multiple providers including residents and students as well as female and female providers. Due to the size of the practice, the delivering provider may not be the same as those providing prenatal care. Patient is not interested in water birth.  MyChart/Babyscripts MyChart access verified. I explained pt will have some visits in office and some virtually. Babyscripts instructions given and order placed.   Blood Pressure Cuff/Weight Scale Patient has private insurance; instructed to purchase blood pressure cuff and bring to first prenatal appt. Explained after first prenatal appt pt will check weekly and document in Babyscripts. Patient does not have weight scale; patient may purchase if they desire to track weight weekly in Babyscripts.  Anatomy US  Explained first scheduled US  will be around 19 weeks. Message sent to MFM to schedule.  Is patient a CenteringPregnancy candidate?  Accepted   Is patient a Mom+Baby Combined Care candidate?  Not a candidate    Is patient a candidate for Babyscripts Optimization? Yes, but prefers Centering Pregnancy  First visit review I reviewed new OB appt with patient. Explained pt will be seen by Bebe Furry, MD at first visit. Discussed [name] genetic screening with patient. Declines carrier screening, but is open to Panorama. Will need Pap smear.  Last Pap Not available, some notes in Care Everywhere indicate last Pap 2021  Laura [name], RN 05/17/2024  9:16 AM

## 2024-05-17 NOTE — Patient Instructions (Addendum)
 Group 24 Tuesday 9-11 AM  March-April due dates Midwife: Olam  Nurse: Daisetta  Dates: 07/19/2024 08/16/2024 09/13/2024 09/27/2024 10/11/2024 10/25/2024 11/08/2024 11/22/2024 11/29/2024 12/06/2024  Reunion postpartum: 01/31/2025

## 2024-05-20 ENCOUNTER — Encounter: Payer: Self-pay | Admitting: *Deleted

## 2024-05-20 ENCOUNTER — Encounter (HOSPITAL_COMMUNITY): Payer: Self-pay | Admitting: Obstetrics & Gynecology

## 2024-05-20 ENCOUNTER — Inpatient Hospital Stay (HOSPITAL_COMMUNITY)
Admission: AD | Admit: 2024-05-20 | Discharge: 2024-05-20 | Disposition: A | Discharge status: Home / Self Care | Attending: Obstetrics & Gynecology | Admitting: Obstetrics & Gynecology

## 2024-05-20 DIAGNOSIS — R519 Headache, unspecified: Secondary | ICD-10-CM | POA: Diagnosis not present

## 2024-05-20 DIAGNOSIS — O26891 Other specified pregnancy related conditions, first trimester: Secondary | ICD-10-CM

## 2024-05-20 DIAGNOSIS — B9689 Other specified bacterial agents as the cause of diseases classified elsewhere: Secondary | ICD-10-CM | POA: Insufficient documentation

## 2024-05-20 DIAGNOSIS — R109 Unspecified abdominal pain: Secondary | ICD-10-CM | POA: Diagnosis not present

## 2024-05-20 DIAGNOSIS — O23591 Infection of other part of genital tract in pregnancy, first trimester: Secondary | ICD-10-CM | POA: Insufficient documentation

## 2024-05-20 DIAGNOSIS — O26899 Other specified pregnancy related conditions, unspecified trimester: Secondary | ICD-10-CM

## 2024-05-20 DIAGNOSIS — Z3A09 9 weeks gestation of pregnancy: Secondary | ICD-10-CM

## 2024-05-20 LAB — WET PREP, GENITAL
Sperm: NONE SEEN
Trich, Wet Prep: NONE SEEN
WBC, Wet Prep HPF POC: <10 (ref <10)
Yeast Wet Prep HPF POC: NONE SEEN

## 2024-05-20 LAB — URINALYSIS, ROUTINE W REFLEX MICROSCOPIC
Bacteria, UA: NONE SEEN
Bilirubin Urine: NEGATIVE
Glucose, UA: NEGATIVE mg/dL
Hgb urine dipstick: NEGATIVE
Ketones, ur: NEGATIVE mg/dL
Nitrite: NEGATIVE
Protein, ur: NEGATIVE mg/dL
Specific Gravity, Urine: 1.026 (ref 1.005–1.030)
pH: 6 (ref 5.0–8.0)

## 2024-05-20 MED ORDER — CYCLOBENZAPRINE HCL 10 MG PO TABS: 10.0000 mg | ORAL_TABLET | Freq: Two times a day (BID) | ORAL | 0 refills | Status: DC | PRN

## 2024-05-20 MED ORDER — ACETAMINOPHEN-CAFFEINE 500-65 MG PO TABS: 2.0000 | ORAL_TABLET | ORAL | 0 refills | Status: DC | PRN

## 2024-05-20 MED ORDER — METRONIDAZOLE 500 MG PO TABS
500.0000 mg | ORAL_TABLET | Freq: Two times a day (BID) | ORAL | 0 refills | Status: DC
Start: 2024-05-20 — End: 2024-06-18

## 2024-05-20 MED ORDER — PROCHLORPERAZINE MALEATE 10 MG PO TABS
10.0000 mg | ORAL_TABLET | Freq: Once | ORAL | Status: AC
  Administered 2024-05-20: 10 mg via ORAL
  Filled 2024-05-20: qty 1

## 2024-05-20 MED ORDER — ACETAMINOPHEN-CAFFEINE 500-65 MG PO TABS
2.0000 | ORAL_TABLET | Freq: Once | ORAL | Status: AC
  Administered 2024-05-20: 2 via ORAL
  Filled 2024-05-20: qty 2

## 2024-05-20 MED ORDER — METOCLOPRAMIDE HCL 10 MG PO TABS
10.0000 mg | ORAL_TABLET | Freq: Four times a day (QID) | ORAL | 0 refills | Status: DC | PRN
Start: 2024-05-20 — End: 2024-06-18

## 2024-05-20 NOTE — Discharge Instructions (Signed)

## 2024-05-20 NOTE — MAU Note (Signed)
 Laura [name] is a 23 y.o. at [redacted]w[redacted]d here in MAU reporting a bad h/a, cramping LLQ, and lower back pain. H/A present for several days. Cramping was present yesterday but is worse. Denies VB. Has tried Tylenol  for pain without relief.   LMP: na Onset of complaint: yesterday Pain score: 8 Vitals:   05/20/24 0614 05/20/24 0615  BP:  127/86  Pulse: 80   Resp: 17   Temp: 98.5 F (36.9 C)   SpO2: 100%      FHT: na   Lab orders placed from triage: u/a

## 2024-05-20 NOTE — MAU Provider Note (Addendum)
 History     CSN: [id number]  Arrival date and time: 05/20/24 9446   Event Date/Time   First Provider Initiated Contact with Patient 05/20/2024  6:15 AM   Chief Complaint  Patient presents with   Abdominal Pain   Back Pain   Headache    HPI  Laura [name] is a 23 y.o. G4P3003 at [redacted]w[redacted]d who presents to the MAU for complaint of headaches, low back pain, and abdominal pain. Headache has been ongoing for several days -- endorses concurrent photophobia and phonophobia. She has blanket over her eyes throughout interview. She also states she has been unable to keep foods down secondary to significant nausea and vomiting which has been refractory to Zofran  and Phenergan . Has been able to keep down a few 24 oz bottles of water, last ate several days ago. Reports low back pain and pelvic pressure as well, this is new as of yesterday, worsened significantly since. No injury/trauma. Denies vaginal bleeding.  Past Medical History:  Diagnosis Date   Anemia    Anxiety    Asthma    Chlamydia    Depression    Genital herpes    Gonorrhea    Ovarian cyst    PID (acute pelvic inflammatory disease)    TOA (tubo-ovarian abscess) 12/28/2023   UTI (urinary tract infection)     Past Surgical History:  Procedure Laterality Date   OVARIAN CYST REMOVAL Left 2020    Family History  Problem Relation Age of Onset   Asthma Mother    Epilepsy Father    Arthritis Maternal Grandmother    Cancer Maternal Grandfather    Diabetes Maternal Grandfather    Kidney disease Maternal Grandfather    Diabetes Maternal Aunt    Anxiety disorder Paternal Aunt    Depression Paternal Aunt    Bleeding Disorder Paternal Aunt     Social History   Tobacco Use   Smoking status: Never   Smokeless tobacco: Never  Vaping Use   Vaping status: Former  Substance Use Topics   Alcohol use: Not Currently    Alcohol/week: 8.0 - 10.0 standard drinks of alcohol    Types: 8 - 10 Shots of liquor per week    Comment: every  other weekend   Drug use: Not Currently    Types: Marijuana    Comment: 2018    Allergies: No Known Allergies  Medications Prior to Admission  Medication Sig Dispense Refill Last Dose/Taking   busPIRone (BUSPAR) 15 MG tablet Take 15 mg by mouth 3 (three) times daily.   05/19/2024   Doxylamine -Pyridoxine  (DICLEGIS ) 10-10 MG TBEC Take 2 tablets by mouth at bedtime. May add 1 tablet at breakfast and 1 tablet at lunch if needed. 100 tablet 2 05/19/2024   escitalopram (LEXAPRO) 20 MG tablet Take 20 mg by mouth daily.   05/19/2024   prenatal vitamin w/FE, FA (PRENATAL 1 + 1) 27-1 MG TABS tablet Take 1 tablet by mouth daily at 12 noon. 30 tablet 0 05/19/2024   promethazine  (PHENERGAN ) 25 MG tablet Take 1 tablet (25 mg total) by mouth every 6 (six) hours as needed for nausea or vomiting. 30 tablet 0 05/19/2024   albuterol  (PROVENTIL ) (2.5 MG/3ML) 0.083% nebulizer solution Take 3 mLs (2.5 mg total) by nebulization every 6 (six) hours as needed for wheezing or shortness of breath. 75 mL 12    albuterol  (VENTOLIN  HFA) 108 (90 Base) MCG/ACT inhaler Inhale 2 puffs into the lungs every 6 (six) hours as needed for wheezing or shortness  of breath.      budesonide-formoterol (SYMBICORT) 80-4.5 MCG/ACT inhaler Inhale 2 puffs into the lungs. (Patient not taking: Reported on 05/17/2024)      ondansetron  (ZOFRAN ) 4 MG tablet Take 1 tablet (4 mg total) by mouth every 6 (six) hours as needed for nausea. (Patient not taking: Reported on 05/20/2024) 20 tablet 0 Not Taking    ROS reviewed and pertinent positives and negatives as documented in HPI.  Physical Exam   Blood pressure 127/86, pulse 80, temperature 98.5 F (36.9 C), resp. rate 17, height 5' 3 (1.6 m), weight 63 kg, SpO2 100%.  Physical Exam Constitutional:      General: She is not in acute distress.    Appearance: Normal appearance. She is not ill-appearing.  HENT:     Head: Normocephalic and atraumatic.  Cardiovascular:     Rate and Rhythm: Normal rate.   Pulmonary:     Effort: Pulmonary effort is normal.     Breath sounds: Normal breath sounds.  Abdominal:     Palpations: Abdomen is soft.     Tenderness: There is no abdominal tenderness. There is no guarding.  Musculoskeletal:        General: Normal range of motion.  Skin:    General: Skin is warm and dry.     Findings: No rash.  Neurological:     General: No focal deficit present.     Mental Status: She is alert and oriented to person, place, and time.    BSUS performed -- FHR 174bpm   Results for orders placed or performed during the hospital encounter of 05/20/24 (from the past 24 hours)  Urinalysis, Routine w reflex microscopic -Urine, Clean Catch     Status: Abnormal   Collection Time: 05/20/24  6:03 AM  Result Value Ref Range   Color, Urine YELLOW YELLOW   APPearance HAZY (A) CLEAR   Specific Gravity, Urine 1.026 1.005 - 1.030   pH 6.0 5.0 - 8.0   Glucose, UA NEGATIVE NEGATIVE mg/dL   Hgb urine dipstick NEGATIVE NEGATIVE   Bilirubin Urine NEGATIVE NEGATIVE   Ketones, ur NEGATIVE NEGATIVE mg/dL   Protein, ur NEGATIVE NEGATIVE mg/dL   Nitrite NEGATIVE NEGATIVE   Leukocytes,Ua SMALL (A) NEGATIVE   RBC / HPF 0-5 0 - 5 RBC/hpf   WBC, UA 0-5 0 - 5 WBC/hpf   Bacteria, UA NONE SEEN NONE SEEN   Squamous Epithelial / HPF 0-5 0 - 5 /HPF   Mucus PRESENT   Wet prep, genital     Status: Abnormal   Collection Time: 05/20/24  6:17 AM   Specimen: Vaginal  Result Value Ref Range   Yeast Wet Prep HPF POC NONE SEEN NONE SEEN   Trich, Wet Prep NONE SEEN NONE SEEN   Clue Cells Wet Prep HPF POC PRESENT (A) NONE SEEN   WBC, Wet Prep HPF POC <10 <10   Sperm NONE SEEN      MAU Course  Procedures  MDM 23 y.o. [id number] at [redacted]w[redacted]d presenting for abdominal pain, back pain, and headache. Headache c/w migraine, likely triggered by dehydration. Will tx with migraine cocktail. Swabs and UA ordered to further evaluate back and pelvic pain.  Patient care handed off to Olam Dalton, NP at  [phone].   Alain Sor, MD OB Fellow, Faculty [facility]  05/20/2024, 7:00 AM     I resumed  care of this patient at 0809. FREDRIK Dalton, NP   - Patient reassed and found to  be sleeping comfortable in the bed. - Wet prep c/w BV ( Plan for RX for Flagyl   at discharge) - HA  resolved after medication given  - Plan to discharge home with RX for HA and back pain associated with Pregnancy ( Excedrin, Flexeril , and Reglan  RX's sent ) - S/R/B discussed with patient and she verbalized understanding and feels stable for Discharge  - Return precautions given    Assessment and Plan  No diagnosis found.   1. Abdominal pain affecting pregnancy  2. Nonintractable headache, unspecified chronicity pattern, unspecified headache type (Primary)  3. [redacted] weeks gestation of pregnancy  4. Bacterial vaginosis   Allergies as of 05/20/2024   No Known Allergies      Medication List     TAKE these medications    acetaminophen -caffeine  500-65 MG Tabs per tablet Commonly known as: EXCEDRIN TENSION HEADACHE Take 2 tablets by mouth as needed (For headaches).   albuterol  108 (90 Base) MCG/ACT inhaler Commonly known as: VENTOLIN  HFA Inhale 2 puffs into the lungs every 6 (six) hours as needed for wheezing or shortness of breath.   albuterol  (2.5 MG/3ML) 0.083% nebulizer solution Commonly known as: PROVENTIL  Take 3 mLs (2.5 mg total) by nebulization every 6 (six) hours as needed for wheezing or shortness of breath.   budesonide-formoterol 80-4.5 MCG/ACT inhaler Commonly known as: SYMBICORT Inhale 2 puffs into the lungs.   busPIRone 15 MG tablet Commonly known as: BUSPAR Take 15 mg by mouth 3 (three) times daily.   cyclobenzaprine  10 MG tablet Commonly known as: FLEXERIL  Take 1 tablet (10 mg total) by mouth 2 (two) times daily as needed for muscle spasms.   Doxylamine -Pyridoxine  10-10 MG Tbec Commonly known as: Diclegis  Take 2 tablets by mouth at bedtime.  May add 1 tablet at breakfast and 1 tablet at lunch if needed.   escitalopram 20 MG tablet Commonly known as: LEXAPRO Take 20 mg by mouth daily.   metoCLOPramide  10 MG tablet Commonly known as: REGLAN  Take 1 tablet (10 mg total) by mouth every 6 (six) hours as needed for nausea.   metroNIDAZOLE  500 MG tablet Commonly known as: FLAGYL  Take 1 tablet (500 mg total) by mouth 2 (two) times daily.   ondansetron  4 MG tablet Commonly known as: ZOFRAN  Take 1 tablet (4 mg total) by mouth every 6 (six) hours as needed for nausea.   Prenatal 27-1 MG Tabs Take 1 tablet by mouth daily at 12 noon.   promethazine  25 MG tablet Commonly known as: PHENERGAN  Take 1 tablet (25 mg total) by mouth every 6 (six) hours as needed for nausea or vomiting.        Olam Dalton, MSN, [facility], [facility] for [employer]

## 2024-05-23 LAB — GC/CHLAMYDIA PROBE AMP (~~LOC~~) NOT AT ARMC
Chlamydia: NEGATIVE
Comment: NEGATIVE
Comment: NORMAL
Neisseria Gonorrhea: NEGATIVE

## 2024-05-26 ENCOUNTER — Ambulatory Visit: Admitting: Obstetrics and Gynecology

## 2024-05-26 ENCOUNTER — Other Ambulatory Visit (HOSPITAL_COMMUNITY)
Admission: RE | Admit: 2024-05-26 | Discharge: 2024-05-26 | Disposition: A | Discharge status: Home / Self Care | Source: Ambulatory Visit | Attending: Obstetrics and Gynecology | Admitting: Obstetrics and Gynecology

## 2024-05-26 ENCOUNTER — Encounter: Payer: Self-pay | Admitting: Obstetrics and Gynecology

## 2024-05-26 ENCOUNTER — Other Ambulatory Visit: Payer: Self-pay | Admitting: Obstetrics and Gynecology

## 2024-05-26 ENCOUNTER — Other Ambulatory Visit: Payer: Self-pay

## 2024-05-26 VITALS — BP 134/87 | HR 80 | Wt 137.0 lb

## 2024-05-26 DIAGNOSIS — O99011 Anemia complicating pregnancy, first trimester: Secondary | ICD-10-CM

## 2024-05-26 DIAGNOSIS — R9389 Abnormal findings on diagnostic imaging of other specified body structures: Secondary | ICD-10-CM

## 2024-05-26 DIAGNOSIS — N898 Other specified noninflammatory disorders of vagina: Secondary | ICD-10-CM

## 2024-05-26 DIAGNOSIS — J45909 Unspecified asthma, uncomplicated: Secondary | ICD-10-CM

## 2024-05-26 DIAGNOSIS — O26891 Other specified pregnancy related conditions, first trimester: Secondary | ICD-10-CM | POA: Insufficient documentation

## 2024-05-26 DIAGNOSIS — Z31430 Encounter of female for testing for genetic disease carrier status for procreative management: Secondary | ICD-10-CM | POA: Diagnosis not present

## 2024-05-26 DIAGNOSIS — Z3490 Encounter for supervision of normal pregnancy, unspecified, unspecified trimester: Secondary | ICD-10-CM

## 2024-05-26 DIAGNOSIS — O219 Vomiting of pregnancy, unspecified: Secondary | ICD-10-CM | POA: Diagnosis not present

## 2024-05-26 DIAGNOSIS — F32A Depression, unspecified: Secondary | ICD-10-CM | POA: Diagnosis not present

## 2024-05-26 DIAGNOSIS — Z3A10 10 weeks gestation of pregnancy: Secondary | ICD-10-CM | POA: Insufficient documentation

## 2024-05-26 DIAGNOSIS — Z3491 Encounter for supervision of normal pregnancy, unspecified, first trimester: Secondary | ICD-10-CM

## 2024-05-26 DIAGNOSIS — F4310 Post-traumatic stress disorder, unspecified: Secondary | ICD-10-CM

## 2024-05-26 DIAGNOSIS — R768 Other specified abnormal immunological findings in serum: Secondary | ICD-10-CM

## 2024-05-26 DIAGNOSIS — D72819 Decreased white blood cell count, unspecified: Secondary | ICD-10-CM

## 2024-05-26 DIAGNOSIS — F411 Generalized anxiety disorder: Secondary | ICD-10-CM

## 2024-05-26 MED ORDER — DOXYLAMINE-PYRIDOXINE 10-10 MG PO TBEC: 2.0000 | DELAYED_RELEASE_TABLET | Freq: Every day | ORAL | 2 refills | Status: DC

## 2024-05-26 MED ORDER — SCOPOLAMINE 1 MG/3DAYS TD PT72
1.0000 | MEDICATED_PATCH | TRANSDERMAL | 12 refills | Status: DC
Start: 2024-05-26 — End: 2024-06-20

## 2024-05-26 MED ORDER — FAMOTIDINE 20 MG PO TABS: 20.0000 mg | ORAL_TABLET | Freq: Two times a day (BID) | ORAL | 3 refills | Status: DC

## 2024-05-26 MED ORDER — PROMETHAZINE HCL 25 MG PO TABS: 25.0000 mg | ORAL_TABLET | Freq: Four times a day (QID) | ORAL | 0 refills | Status: DC | PRN

## 2024-05-26 NOTE — Progress Notes (Unsigned)
 New OB Note  05/26/2024   Clinic: Center for [facility]  Chief Complaint: new OB  Transfer of Care Patient: no  History of Present Illness: [name] is a 23 y.o. [id number] at 10/3 weeks (EDC 3/23, based on 6wk u/s)  Pregnancy complicated by has Abnormal chest CT; Biological false positive RPR test; Asthma; Depressive disorder; GAD (generalized anxiety disorder); PTSD (post-traumatic stress disorder); Leukopenia; Supervision of low-risk pregnancy; and Anemia in pregnancy, first trimester on their problem list.   Nausea and vomiting or pregnancy continues. Patient states she had this with her other pregnancies and nothing really helped. Only med she can remember was zofran  and doesn't recall using a scope patch. Patient had RN visit on 8/19 and noted the n/v and diclegis  and phenergan  sent in but patient not on these.   No SAB s/s. Pt on flexeril  for back pain.   ROS: A 12-point review of systems was performed and negative, except as stated in the above HPI.  OBGYN History: As per HPI. OB History  Gravida Para Term Preterm AB Living  4 3 3  0 0 3  SAB IAB Ectopic Multiple Live Births  0 0 0 0 3    # Outcome Date GA Lbr Len/2nd Weight Sex Type Anes PTL Lv  4 Current           3 Term 09/02/21 [redacted]w[redacted]d / 00:09 5 lb 14.7 oz (2.685 kg) F Vag-Spont EPI N LIV  2 Term 04/12/20 [redacted]w[redacted]d 03:45 / 00:51 7 lb 1 oz (3.204 kg) M Vag-Spont EPI N LIV  1 Term 01/30/18 [redacted]w[redacted]d 03:24 / 00:30 7 lb 0.2 oz (3.182 kg) F Vag-Spont EPI N LIV     Past Medical History: Past Medical History:  Diagnosis Date   Anemia    Anxiety    Asthma    Chlamydia    Depression    Genital herpes    Gonorrhea    Ovarian cyst    PID (acute pelvic inflammatory disease)    TOA (tubo-ovarian abscess) 12/28/2023   UTI (urinary tract infection)     Past Surgical History: Past Surgical History:  Procedure Laterality Date   OVARIAN CYST REMOVAL Left 2020    Family History:  Family History  Problem  Relation Age of Onset   Asthma Mother    Epilepsy Father    Arthritis Maternal Grandmother    Cancer Maternal Grandfather    Diabetes Maternal Grandfather    Kidney disease Maternal Grandfather    Diabetes Maternal Aunt    Anxiety disorder Paternal Aunt    Depression Paternal Aunt    Bleeding Disorder Paternal Aunt     Social History:  Social History   Socioeconomic History   Marital status: Single    Spouse name: Not on file   Number of children: Not on file   Years of education: Not on file   Highest education level: Not on file  Occupational History   Not on file  Tobacco Use   Smoking status: Never   Smokeless tobacco: Never  Vaping Use   Vaping status: Former  Substance and Sexual Activity   Alcohol use: Not Currently    Alcohol/week: 8.0 - 10.0 standard drinks of alcohol    Types: 8 - 10 Shots of liquor per week    Comment: every other weekend   Drug use: Not Currently    Types: Marijuana    Comment: 2018   Sexual activity: Yes  Other Topics Concern   Not  on file  Social History Narrative   Not on file   Social Drivers of Health   Financial Resource Strain: Medium Risk (01/14/2024)   Received from [facility]   Overall Financial Resource Strain (CARDIA)    Difficulty of Paying Living Expenses: Somewhat hard  Food Insecurity: No Food Insecurity (05/26/2024)   Hunger Vital Sign    Worried About Running Out of Food in the Last Year: Never true    Ran Out of Food in the Last Year: Never true  Transportation Needs: No Transportation Needs (05/26/2024)   PRAPARE - Administrator, Civil Service (Medical): No    Lack of Transportation (Non-Medical): No  Physical Activity: Insufficiently Active (01/14/2024)   Received from [facility]   Exercise Vital Sign    On average, how many days per week do you engage in moderate to strenuous exercise (like a brisk walk)?: 5 days    On average, how many minutes do you engage in exercise at this level?: 10 min   Stress: Stress Concern Present (01/14/2024)   Received from [facility] Occupational [facility] - Occupational Stress Questionnaire    Feeling of Stress : Very much  Social Connections: Moderately Integrated (01/14/2024)   Received from [facility]   Social Network    How would you rate your social network (family, work, friends)?: Adequate participation with social networks  Intimate Partner Violence: Not At Risk (01/14/2024)   Received from [facility]   HITS    Over the last 12 months how often did your partner physically hurt you?: Never    Over the last 12 months how often did your partner insult you or talk down to you?: Never    Over the last 12 months how often did your partner threaten you with physical harm?: Never    Over the last 12 months how often did your partner scream or curse at you?: Never    Allergy: No Known Allergies  Current Outpatient Medications: Buspar, Lexapro  Physical Exam:   BP 134/87   Pulse 80   Wt 137 lb (62.1 kg)   BMI 24.27 kg/m  Body mass index is 24.27 kg/m. Contractions: Not present Vag. Bleeding: None. FHTs: 170s  General appearance: Well nourished, well developed female in no acute distress.  Neck:  Supple, normal appearance, and no thyromegaly  Cardiovascular: S1, S2 normal, no murmur, rub or gallop, regular rate and rhythm Respiratory:  Clear to auscultation bilateral. Normal respiratory effort Abdomen: positive bowel sounds and no masses, hernias; diffusely non tender to palpation, non distended Breasts: no s/s.  Neuro/Psych:  Normal mood and affect.  Skin:  Warm and dry.  Pelvic exam: normal external genitalia, vulva, vagina, cervix, uterus c/w 10wk pregnancy) and adnexa.  Laboratory: reviewed  Imaging:  reviewed  Assessment: patient stable  Plan: 1. [redacted] weeks gestation of pregnancy (Primary) Recommend afp at 15wks. Anatomy u/s already scheduled.  Patient given note recommending having her away  from potentially toxic debris and/or to wear appropriate PPE; patient works at [employer] - Anemia Profile B - CBC/D/Plt+RPR+Rh+ABO+RubIgG... - Hemoglobin A1c - Culture, OB Urine - Cytology - PAP( [place]) - Cervicovaginal ancillary only( [place])  2. Vaginal discharge during pregnancy in first trimester Patient endorses. Normal exam today - Cytology - PAP( [place]) - Cervicovaginal ancillary only( [place])  3. Nausea/vomiting of pregnancy Pepcid  bid and I re sent in the diclegis  and PRN phenergan  and PRN scope patch -  PANORAMA PRENATAL TEST  4. Encounter of female for testing for genetic disease carrier status for procreative management - [facility] Basic Panel  5. Abnormal chest CT Can repeat cxr postpartum  6. Anemia in pregnancy, first trimester  7. Biological false positive RPR test  8. Leukopenia, unspecified type  9. Depressive disorder D/w her always potential risk of birth defects with medications even those these medications have been used for a long time and no appreciable increased risk in defects likely noted but potential always still exists. Pt to continue  10. PTSD (post-traumatic stress disorder)  11. GAD (generalized anxiety disorder)  12. Uncomplicated asthma, unspecified asthma severity, unspecified whether persistent No s/s  on no meds  13. Encounter for supervision of low-risk pregnancy in first trimester  Problem list reviewed and updated.  Follow up in 4 weeks.  >50% of 35 min visit spent on counseling and coordination of care.  No follow-ups on file.  Future Appointments  Date Time Provider Department Center  06/20/2024 10:55 AM Milly Olam DELENA EDDY [facility]  07/19/2024  9:00 AM CENTERING PROVIDER [facility]  07/29/2024  8:00 AM [facility] PROVIDER 1 [facility]  07/29/2024  8:30 AM [facility] US1 [facility]  08/16/2024  9:00 AM CENTERING PROVIDER [facility]  09/13/2024  9:00 AM CENTERING PROVIDER [facility]   09/27/2024  9:00 AM CENTERING PROVIDER [facility]  10/11/2024  9:00 AM CENTERING PROVIDER [facility]  10/25/2024  9:00 AM CENTERING PROVIDER [facility]  11/08/2024  9:00 AM CENTERING PROVIDER [facility]  11/22/2024  9:00 AM CENTERING PROVIDER [facility]  11/29/2024  9:00 AM CENTERING PROVIDER [facility]  12/06/2024  9:00 AM CENTERING PROVIDER [facility]    Bebe Izell Overcast MD Attending Center for [facility])

## 2024-05-27 LAB — CERVICOVAGINAL ANCILLARY ONLY
Bacterial Vaginitis (gardnerella): NEGATIVE
Candida Glabrata: NEGATIVE
Candida Vaginitis: NEGATIVE
Chlamydia: NEGATIVE
Comment: NEGATIVE
Comment: NEGATIVE
Comment: NEGATIVE
Comment: NEGATIVE
Comment: NEGATIVE
Comment: NORMAL
Neisseria Gonorrhea: NEGATIVE
Trichomonas: NEGATIVE

## 2024-05-27 LAB — CYTOLOGY - PAP: Diagnosis: NEGATIVE

## 2024-05-29 LAB — CBC/D/PLT+RPR+RH+ABO+RUBIGG...
Basophils Absolute: 0 x10E3/uL (ref 0.0–0.2)
Basos: 0 %
EOS (ABSOLUTE): 0 x10E3/uL (ref 0.0–0.4)
Eos: 0 %
HCV Ab: NONREACTIVE
HIV Screen 4th Generation wRfx: NONREACTIVE
Hematocrit: 37.6 % (ref 34.0–46.6)
Hemoglobin: 11.2 g/dL (ref 11.1–15.9)
Hepatitis B Surface Ag: NEGATIVE
Immature Grans (Abs): 0 x10E3/uL (ref 0.0–0.1)
Immature Granulocytes: 0 %
Lymphocytes Absolute: 1.2 x10E3/uL (ref 0.7–3.1)
Lymphs: 32 %
MCH: 22.5 pg — ABNORMAL LOW (ref 26.6–33.0)
MCHC: 29.8 g/dL — ABNORMAL LOW (ref 31.5–35.7)
MCV: 76 fL — ABNORMAL LOW (ref 79–97)
Monocytes Absolute: 0.3 x10E3/uL (ref 0.1–0.9)
Monocytes: 8 %
Neutrophils Absolute: 2.2 x10E3/uL (ref 1.4–7.0)
Neutrophils: 60 %
Platelets: 187 x10E3/uL (ref 150–450)
RBC: 4.98 x10E6/uL (ref 3.77–5.28)
RDW: 14.8 % (ref 11.7–15.4)
RPR Ser Ql: REACTIVE — AB
Rh Factor: POSITIVE
Rubella Antibodies, IGG: 1.63 {index} (ref >0.99)
WBC: 3.6 x10E3/uL (ref 3.4–10.8)

## 2024-05-29 LAB — ANEMIA PROFILE B
Ferritin: 14 ng/mL — ABNORMAL LOW (ref 15–150)
Folate: 11.5 ng/mL (ref >3.0)
Iron Saturation: 30 % (ref 15–55)
Iron: 111 ug/dL (ref 27–159)
Retic Ct Pct: 1.5 % (ref 0.6–2.6)
Total Iron Binding Capacity: 364 ug/dL (ref 250–450)
UIBC: 253 ug/dL (ref 131–425)
Vitamin B-12: 319 pg/mL (ref 232–1245)

## 2024-05-29 LAB — RPR, QUANT+TP ABS (REFLEX)
Rapid Plasma Reagin, Quant: 1:2 {titer} — ABNORMAL HIGH
T Pallidum Abs: NONREACTIVE

## 2024-05-29 LAB — AB SCR+ANTIBODY ID: Antibody Screen: POSITIVE — AB

## 2024-05-29 LAB — CULTURE, OB URINE

## 2024-05-29 LAB — HEMOGLOBIN A1C
Est. average glucose Bld gHb Est-mCnc: 91 mg/dL
Hgb A1c MFr Bld: 4.8 % (ref 4.8–5.6)

## 2024-05-29 LAB — HCV INTERPRETATION

## 2024-05-29 LAB — URINE CULTURE, OB REFLEX

## 2024-05-30 ENCOUNTER — Encounter: Payer: Self-pay | Admitting: Obstetrics & Gynecology

## 2024-05-30 ENCOUNTER — Inpatient Hospital Stay (HOSPITAL_COMMUNITY)
Admission: AD | Admit: 2024-05-30 | Discharge: 2024-05-30 | Disposition: A | Discharge status: Home / Self Care | Payer: Self-pay | Attending: Obstetrics & Gynecology | Admitting: Obstetrics & Gynecology

## 2024-05-30 DIAGNOSIS — O10011 Pre-existing essential hypertension complicating pregnancy, first trimester: Secondary | ICD-10-CM

## 2024-05-30 DIAGNOSIS — Z3A11 11 weeks gestation of pregnancy: Secondary | ICD-10-CM

## 2024-05-30 DIAGNOSIS — O169 Unspecified maternal hypertension, unspecified trimester: Secondary | ICD-10-CM | POA: Diagnosis not present

## 2024-05-30 DIAGNOSIS — R102 Pelvic and perineal pain: Secondary | ICD-10-CM | POA: Diagnosis not present

## 2024-05-30 DIAGNOSIS — O26892 Other specified pregnancy related conditions, second trimester: Secondary | ICD-10-CM | POA: Diagnosis not present

## 2024-05-30 DIAGNOSIS — N949 Unspecified condition associated with female genital organs and menstrual cycle: Secondary | ICD-10-CM

## 2024-05-30 DIAGNOSIS — R1011 Right upper quadrant pain: Secondary | ICD-10-CM | POA: Diagnosis present

## 2024-05-30 DIAGNOSIS — O26891 Other specified pregnancy related conditions, first trimester: Secondary | ICD-10-CM | POA: Insufficient documentation

## 2024-05-30 LAB — URINALYSIS, ROUTINE W REFLEX MICROSCOPIC
Bilirubin Urine: NEGATIVE
Glucose, UA: NEGATIVE mg/dL
Hgb urine dipstick: NEGATIVE
Ketones, ur: NEGATIVE mg/dL
Nitrite: NEGATIVE
Protein, ur: NEGATIVE mg/dL
Specific Gravity, Urine: 1.026 (ref 1.005–1.030)
pH: 5 (ref 5.0–8.0)

## 2024-05-30 LAB — CBC
HCT: 33 % — ABNORMAL LOW (ref 36.0–46.0)
Hemoglobin: 10.4 g/dL — ABNORMAL LOW (ref 12.0–15.0)
MCH: 22.6 pg — ABNORMAL LOW (ref 26.0–34.0)
MCHC: 31.5 g/dL (ref 30.0–36.0)
MCV: 71.7 fL — ABNORMAL LOW (ref 80.0–100.0)
Platelets: 192 K/uL (ref 150–400)
RBC: 4.6 MIL/uL (ref 3.87–5.11)
RDW: 13.9 % (ref 11.5–15.5)
WBC: 4.4 K/uL (ref 4.0–10.5)
nRBC: 0 % (ref 0.0–0.2)

## 2024-05-30 LAB — COMPREHENSIVE METABOLIC PANEL WITH GFR
ALT: 11 U/L (ref 0–44)
AST: 15 U/L (ref 15–41)
Albumin: 3.2 g/dL — ABNORMAL LOW (ref 3.5–5.0)
Alkaline Phosphatase: 29 U/L — ABNORMAL LOW (ref 38–126)
Anion gap: 8 (ref 5–15)
BUN: 8 mg/dL (ref 6–20)
CO2: 23 mmol/L (ref 22–32)
Calcium: 9.4 mg/dL (ref 8.9–10.3)
Chloride: 105 mmol/L (ref 98–111)
Creatinine, Ser: 0.61 mg/dL (ref 0.44–1.00)
GFR, Estimated: >60 mL/min (ref >60)
Glucose, Bld: 87 mg/dL (ref 70–99)
Potassium: 4.1 mmol/L (ref 3.5–5.1)
Sodium: 136 mmol/L (ref 135–145)
Total Bilirubin: 0.3 mg/dL (ref 0.0–1.2)
Total Protein: 6.8 g/dL (ref 6.5–8.1)

## 2024-05-30 MED ORDER — ASPIRIN 81 MG PO TBEC
162.0000 mg | DELAYED_RELEASE_TABLET | Freq: Every day | ORAL | 2 refills | Status: AC
Start: 2024-05-30 — End: ?

## 2024-05-30 MED ORDER — ACETAMINOPHEN 500 MG PO TABS: 1000.0000 mg | ORAL_TABLET | Freq: Four times a day (QID) | ORAL | Status: DC | PRN

## 2024-05-30 MED ORDER — NIFEDIPINE ER OSMOTIC RELEASE 30 MG PO TB24: 30.0000 mg | ORAL_TABLET | Freq: Every day | ORAL | 2 refills | Status: DC

## 2024-05-30 NOTE — MAU Note (Addendum)
 Pt says she checked her BP at 603pm. Has been told by office  on clinic to check- 140/92, 142/98- at 657pm No H/A , No vision problems.  Feels upper abd cramps on right side - started Sat - Lower abd cramps- sometimes

## 2024-05-30 NOTE — MAU Provider Note (Addendum)
 Chief Complaint:  Abdominal Pain  HPI  Laura [name] is a 23 y.o. [phone] at [redacted]w[redacted]d who presents to maternity admissions reporting right upper quadrant pain that began Saturday, has not felt well all day and has been mostly in bed. Also endorses bilateral lower abdominal pain consistent with round ligament pain (treated with flexeril  but it mostly makes her sleepy). Denies excess vaginal discharge, odor, vaginal discomfort or bleeding. Took her BP at home and it was elevated >140/90 multiplw times, so she came in. No headache or visual disturbances. No other physical complaints.  Pregnancy Course: Receives care at [facility], new OB was with Dr. Izell on 05/26/24  Past Medical History:  Diagnosis Date   Anemia    Anxiety    Asthma    Chlamydia    Depression    Genital herpes    Gonorrhea    Ovarian cyst    PID (acute pelvic inflammatory disease)    TOA (tubo-ovarian abscess) 12/28/2023   UTI (urinary tract infection)    OB History  Gravida Para Term Preterm AB Living  4 3 3  0 0 3  SAB IAB Ectopic Multiple Live Births  0 0 0 0 3    # Outcome Date GA Lbr Len/2nd Weight Sex Type Anes PTL Lv  4 Current           3 Term 09/02/21 [redacted]w[redacted]d / 00:09 2685 g F Vag-Spont EPI N LIV  2 Term 04/12/20 [redacted]w[redacted]d 03:45 / 00:51 3204 g M Vag-Spont EPI N LIV  1 Term 01/30/18 [redacted]w[redacted]d 03:24 / 00:30 3182 g F Vag-Spont EPI N LIV   Past Surgical History:  Procedure Laterality Date   OVARIAN CYST REMOVAL Left 2020   Family History  Problem Relation Age of Onset   Asthma Mother    Epilepsy Father    Arthritis Maternal Grandmother    Cancer Maternal Grandfather    Diabetes Maternal Grandfather    Kidney disease Maternal Grandfather    Diabetes Maternal Aunt    Anxiety disorder Paternal Aunt    Depression Paternal Aunt    Bleeding Disorder Paternal Aunt    Social History   Tobacco Use   Smoking status: Never   Smokeless tobacco: Never  Vaping Use   Vaping status: Former  Substance Use Topics    Alcohol use: Not Currently    Alcohol/week: 8.0 - 10.0 standard drinks of alcohol    Types: 8 - 10 Shots of liquor per week    Comment: every other weekend   Drug use: Not Currently    Types: Marijuana    Comment: 2018   No Known Allergies No medications prior to admission.   I have reviewed patient's Past Medical Hx, Surgical Hx, Family Hx, Social Hx, medications and allergies.   ROS  Pertinent items noted in HPI and remainder of comprehensive ROS otherwise negative.   PHYSICAL EXAM  Patient Vitals for the past 24 hrs:  BP Temp Temp src Pulse Resp Height Weight  05/30/24 2300 -- -- -- -- 12 -- --  05/30/24 2203 119/77 -- -- 85 -- -- --  05/30/24 2021 (!) 133/101 98.3 F (36.8 C) Oral 99 12 5' 3 (1.6 m) 63.1 kg   Constitutional: Well-developed, well-nourished female in no acute distress.  Cardiovascular: normal rate & rhythm, warm and well-perfused Respiratory: normal effort, no problems with respiration noted GI: Abd soft, mildly tender to lower abdomen, non-distended MS: Extremities nontender, no edema, normal ROM Neurologic: Alert and oriented x 4.  GU: no  CVA tenderness Pelvic: exam deferred  FHR: 175 bpm   Labs: Results for orders placed or performed during the hospital encounter of 05/30/24 (from the past 24 hours)  Urinalysis, Routine w reflex microscopic -Urine, Clean Catch     Status: Abnormal   Collection Time: 05/30/24  8:25 PM  Result Value Ref Range   Color, Urine YELLOW YELLOW   APPearance HAZY (A) CLEAR   Specific Gravity, Urine 1.026 1.005 - 1.030   pH 5.0 5.0 - 8.0   Glucose, UA NEGATIVE NEGATIVE mg/dL   Hgb urine dipstick NEGATIVE NEGATIVE   Bilirubin Urine NEGATIVE NEGATIVE   Ketones, ur NEGATIVE NEGATIVE mg/dL   Protein, ur NEGATIVE NEGATIVE mg/dL   Nitrite NEGATIVE NEGATIVE   Leukocytes,Ua MODERATE (A) NEGATIVE   RBC / HPF 0-5 0 - 5 RBC/hpf   WBC, UA 21-50 0 - 5 WBC/hpf   Bacteria, UA FEW (A) NONE SEEN   Squamous Epithelial / HPF 11-20 0  - 5 /HPF   Mucus PRESENT    Hyaline Casts, UA PRESENT   CBC     Status: Abnormal   Collection Time: 05/30/24  8:55 PM  Result Value Ref Range   WBC 4.4 4.0 - 10.5 K/uL   RBC 4.60 3.87 - 5.11 MIL/uL   Hemoglobin 10.4 (L) 12.0 - 15.0 g/dL   HCT 66.9 (L) 63.9 - 53.9 %   MCV 71.7 (L) 80.0 - 100.0 fL   MCH 22.6 (L) 26.0 - 34.0 pg   MCHC 31.5 30.0 - 36.0 g/dL   RDW 86.0 88.4 - 84.4 %   Platelets 192 150 - 400 K/uL   nRBC 0.0 0.0 - 0.2 %  Comprehensive metabolic panel     Status: Abnormal   Collection Time: 05/30/24  8:55 PM  Result Value Ref Range   Sodium 136 135 - 145 mmol/L   Potassium 4.1 3.5 - 5.1 mmol/L   Chloride 105 98 - 111 mmol/L   CO2 23 22 - 32 mmol/L   Glucose, Bld 87 70 - 99 mg/dL   BUN 8 6 - 20 mg/dL   Creatinine, Ser 9.38 0.44 - 1.00 mg/dL   Calcium 9.4 8.9 - 89.6 mg/dL   Total Protein 6.8 6.5 - 8.1 g/dL   Albumin 3.2 (L) 3.5 - 5.0 g/dL   AST 15 15 - 41 U/L   ALT 11 0 - 44 U/L   Alkaline Phosphatase 29 (L) 38 - 126 U/L   Total Bilirubin 0.3 0.0 - 1.2 mg/dL   GFR, Estimated >39 >39 mL/min   Anion gap 8 5 - 15   Imaging:  No results found.  MDM & MAU COURSE  MDM:  MAU Course: Orders Placed This Encounter  Procedures   Urinalysis, Routine w reflex microscopic -Urine, Clean Catch   CBC   Comprehensive metabolic panel   Discharge patient Discharge disposition: 01-Home or Self Care; Discharge patient date: 05/30/2024   Meds ordered this encounter  Medications   NIFEdipine  (PROCARDIA -XL/NIFEDICAL-XL) 30 MG 24 hr tablet    Sig: Take 1 tablet (30 mg total) by mouth daily.    Dispense:  30 tablet    Refill:  2   aspirin  EC 81 MG tablet    Sig: Take 2 tablets (162 mg total) by mouth at bedtime. Start taking when you are [redacted] weeks pregnant for rest of pregnancy for prevention of preeclampsia    Dispense:  300 tablet    Refill:  2   acetaminophen  (TYLENOL ) 500 MG tablet  Sig: Take 2 tablets (1,000 mg total) by mouth every 6 (six) hours as needed for moderate  pain (pain score 4-6) or mild pain (pain score 1-3).   Vaginal swabs normal on 8/28, no significant change in discharge since then. Lower quadrant pain likely round ligament, recommend pelvic floor physical therapy. No history of hypertension, concerned about her multiple elevated ones at home.  Nauseated, but declined meds. Labs ordered.  Cornell Finder, CNM, MSN, IBCLC Certified Nurse Midwife, [facility]  Attestation of Attending Supervision of Advanced Practice Provider (CNM/NP/PA): Evaluation and management procedures were performed by the Advanced Practice Provider under my supervision and collaboration.  I have reviewed the Advanced Practice Provider's note and chart, and I agree with the management and plan. I have also made any necessary editorial changes.    ASSESSMENT   1. Hypertension during pregnancy in first trimester   2. Pain of round ligament   3. [redacted] weeks gestation of pregnancy     PLAN   Will start on low dose antihypertensive, Nifedipine  XR 30 mg daily. Also will start ASA 162 mg daily at 12 weeks for preeclampsia prophylaxis.  Normal labs.  Discussed this with patient. She will follow up within the week for BP check in office, message sent to [facility] office. Discharged home in stable condition   GLORIS HUGGER, MD, FACOG Obstetrician & Gynecologist, [facility] for [employer], [facility]

## 2024-06-01 NOTE — Progress Notes (Signed)
 Subjective   HPI:  Laura [name] is a 23 y.o.  female who presents to the office with:  Chief Complaint  Patient presents with  . Workers Disability   Patient presents today to discuss disability for work. She is currently [redacted] weeks pregnant and she is considered high risk pregnancy due to blood pressure issues. She was recently started on Nifedipine  30mg  by OBGYN and has weekly appointments for BP checks and prenatal care. She states she will frequently have to miss work for this and it is counted against her. She also works around [employer] and toxins that can harm the baby. She works at [employer] and [employer], deodorant and other body products. She states her OBGYN provided a letter stating she cannot be around these materials but work will not honor this or accommodate her.     PMH: Past medical history, Past surgical history, Social history, family history were reviewed as noted in EMR.  Pertinent for:  Past Medical History:  Diagnosis Date  . Anxiety   . Depression      Medications and allergies reviewed.  ROS: All systems were negative including constitutional, CV, respiratory, GI/GU except for those noted in the HPI.  Objective   Vital Signs: BP 122/82 (BP Location: Left Upper Arm, Patient Position: Sitting)   Pulse (!) 130   Temp 97 F (36.1 C) (Temporal)   Ht 5' 3 (1.6 m)   Wt 137 lb 12.8 oz (62.5 kg)   SpO2 99%   BMI 24.41 kg/m   Wt Readings from Last 3 Encounters:  06/01/24 137 lb 12.8 oz (62.5 kg)  02/12/24 131 lb (59.4 kg)  01/15/24 127 lb 6.4 oz (57.8 kg)   Physical Exam Constitutional:      General: She is not in acute distress.    Appearance: Normal appearance.  Neurological:     Mental Status: She is alert.      Assessment/Plan   No orders of the defined types were placed in this encounter.   1. Pre-existing essential hypertension during pregnancy in first trimester (*)   2. [redacted] weeks gestation of pregnancy (*)      Informed patient that she will need to discuss this with her OBGYN at her next visit. I strongly encouraged her to ask her employer for FMLA forms as this can help cover her frequent absences from work for doctors appointments. Patient states she has a nurse visit tomorrow for BP check and will discuss forms then.   No follow-ups on file.  No future appointments.  Medications at end of visit today: Current Medications[1]  Patient Care Team: Lannie Gell, PA-C as PCP - General (Physician Assistant)          [1]  Current Outpatient Medications:  .  acetaminophen  (TYLENOL ) 500 mg tablet, Take two tablets (1,000 mg dose) by mouth every 6 (six) hours as needed., Disp: , Rfl:  .  Acetaminophen -Caffeine  500-65 MG TABS, Take 2 tablets by mouth as needed., Disp: , Rfl:  .  albuterol  sulfate HFA (PROVENTIL ,VENTOLIN ,PROAIR ) 108 (90 Base) MCG/ACT inhaler, Inhale two puffs into the lungs every 6 (six) hours as needed for Wheezing or Shortness of Breath., Disp: 18 g, Rfl: 0 .  aspirin  (ECOTRIN LOW DOSE) EC tablet, Take two tablets (162 mg dose) by mouth daily., Disp: , Rfl:  .  budesonide-formoterol (SYMBICORT) 80-4.5 mcg/actuation inhaler, Inhale two puffs into the lungs 2 (two) times daily., Disp: 10.2 g, Rfl: 1 .  busPIRone (BUSPAR) 15 MG tablet,  Take one tablet (15 mg dose) by mouth 3 (three) times a day., Disp: 90 tablet, Rfl: 2 .  cyclobenzaprine  (FLEXERIL ) 10 mg tablet, Take one tablet (10 mg dose) by mouth 2 (two) times a day as needed., Disp: , Rfl:  .  doxylamine -pyridoxine  (DICLEGIS ) 10-10 mg per DR tablet, Take two tablets by mouth at bedtime., Disp: , Rfl:  .  escitalopram oxalate (LEXAPRO) 20 mg tablet, Take one tablet (20 mg dose) by mouth daily., Disp: 30 tablet, Rfl: 2 .  ibuprofen  (ADVIL ,MOTRIN ) 600 mg tablet, Take one tablet (600 mg dose) by mouth every 6 (six) hours as needed., Disp: , Rfl:  .  metoclopramide  HCl (REGLAN ) 10 mg tablet, Take one tablet (10 mg dose) by  mouth every 6 (six) hours as needed., Disp: , Rfl:  .  metroNIDAZOLE  (FLAGYL ) 500 mg tablet, Take one tablet (500 mg dose) by mouth 2 (two) times daily., Disp: , Rfl:  .  NIFEdipine  (PROCARDIA  XL) 30 mg 24 hr tablet, Take one tablet (30 mg dose) by mouth daily., Disp: , Rfl:  .  promethazine  (PHENERGAN ) 25 MG tablet, Take one tablet (25 mg dose) by mouth every 6 (six) hours as needed., Disp: , Rfl:  .  scopolamine  (TRANSDERM-SCOP 1.5 MG) 1 MG/3DAYS patch, Place one patch onto the skin every third day., Disp: , Rfl:   Current Facility-Administered Medications:  .  medroxyPROGESTERone (DEPO-PROVERA) injection 150 mg, 150 mg, Intramuscular, Q90 Days, Niall Moreira, PA-C, 150 mg at 07/13/23 1020

## 2024-06-02 ENCOUNTER — Ambulatory Visit

## 2024-06-02 ENCOUNTER — Other Ambulatory Visit: Payer: Self-pay

## 2024-06-02 VITALS — BP 127/81 | HR 85 | Wt 140.0 lb

## 2024-06-02 DIAGNOSIS — Z0289 Encounter for other administrative examinations: Secondary | ICD-10-CM

## 2024-06-02 DIAGNOSIS — Z0130 Encounter for examination of blood pressure without abnormal findings: Secondary | ICD-10-CM

## 2024-06-02 NOTE — Progress Notes (Signed)
 Blood Pressure Check Visit  Laura [name] is here for blood pressure check following MAU visit on 05/30/2024. BP today is 127/81. Patient endorses dizziness blurred vision headache shortness of breath.  Pt advised to continue current plan and to contact us  with any changes in symptoms. Pt denies any further questions or concerns at this time. Pt will have centering appointment on 06/20/2024.   Cyndee JAYSON Molt, RN 06/02/2024  2:58 PM

## 2024-06-03 ENCOUNTER — Encounter: Payer: Self-pay | Admitting: Obstetrics and Gynecology

## 2024-06-03 ENCOUNTER — Ambulatory Visit: Payer: Self-pay | Admitting: Obstetrics and Gynecology

## 2024-06-03 DIAGNOSIS — O361910 Maternal care for other isoimmunization, first trimester, not applicable or unspecified: Secondary | ICD-10-CM | POA: Insufficient documentation

## 2024-06-03 DIAGNOSIS — Z3491 Encounter for supervision of normal pregnancy, unspecified, first trimester: Secondary | ICD-10-CM

## 2024-06-03 DIAGNOSIS — R898 Other abnormal findings in specimens from other organs, systems and tissues: Secondary | ICD-10-CM

## 2024-06-03 DIAGNOSIS — R9389 Abnormal findings on diagnostic imaging of other specified body structures: Secondary | ICD-10-CM

## 2024-06-03 LAB — PANORAMA PRENATAL TEST FULL PANEL:PANORAMA TEST PLUS 5 ADDITIONAL MICRODELETIONS: FETAL FRACTION: 15.6

## 2024-06-03 MED ORDER — FERROUS SULFATE 324 MG PO TBEC: 324.0000 mg | DELAYED_RELEASE_TABLET | ORAL | 0 refills | Status: DC

## 2024-06-08 ENCOUNTER — Encounter: Payer: Self-pay | Admitting: Obstetrics and Gynecology

## 2024-06-10 NOTE — Progress Notes (Signed)
 Subjective   HPI:  Laura [name] is a 23 y.o.  female who presents to the office with:  Chief Complaint  Patient presents with  . Exposure to STD  . Gaps In Care    Flu// HPV//    Patient presents requesting STD screening. She denies recent exposure and denies symptoms such as vaginal discharge, itching, odor, pain, bleeding, fever, chills. Some abdominal cramping from pregnancy itself but nothing from her ordinary.     PMH: Past medical history, Past surgical history, Social history, family history were reviewed as noted in EMR.  Pertinent for:  Past Medical History:  Diagnosis Date  . Anxiety   . Depression      Medications and allergies reviewed.  ROS: All systems were negative including constitutional, CV, respiratory, GI/GU except for those noted in the HPI.  Objective   Vital Signs: BP 113/83   Pulse 109   Temp 98.4 F (36.9 C) (Temporal)   Resp 18   Ht 5' 3 (1.6 m)   Wt 134 lb 12.8 oz (61.1 kg)   LMP  (LMP Unknown)   SpO2 99%   BMI 23.88 kg/m   Wt Readings from Last 3 Encounters:  06/10/24 134 lb 12.8 oz (61.1 kg)  06/01/24 137 lb 12.8 oz (62.5 kg)  02/12/24 131 lb (59.4 kg)   Physical Exam Constitutional:      General: She is not in acute distress.    Appearance: Normal appearance.  Genitourinary:    Comments: Asymptomatic and declines exam Neurological:     Mental Status: She is alert.      Assessment/Plan   Orders Placed This Encounter  Procedures  . CT, GC & TV, NAA vag swab Urine  . HSV 1 and 2 Ab, IgG  . HIV-1/O/2, 4th Generation  . Syphilis: RPR With Reflex to RPR Titer and Treponemal Ab    1. Screen for STD (sexually transmitted disease)   2. [redacted] weeks gestation of pregnancy (*)     Patient asymptomatic and she declines exam today.  Routine STD screening obtained per her request.  We discussed limited utility of HSV blood testing, as this only determines if patient has previously been exposed but does not indicate active  infection.  She is understanding of this but would like to still have this blood test done.  Will follow-up once results are available.  Advised patient to continue seeing her OB/GYN for routine prenatal care.   No follow-ups on file.  No future appointments.  Medications at end of visit today: Current Medications[1]  Patient Care Team: Lannie Gell, PA-C as PCP - General (Physician Assistant)          [1]  Current Outpatient Medications:  .  acetaminophen  (TYLENOL ) 500 mg tablet, Take two tablets (1,000 mg dose) by mouth every 6 (six) hours as needed., Disp: , Rfl:  .  Acetaminophen -Caffeine  500-65 MG TABS, Take 2 tablets by mouth as needed., Disp: , Rfl:  .  albuterol  sulfate HFA (PROVENTIL ,VENTOLIN ,PROAIR ) 108 (90 Base) MCG/ACT inhaler, Inhale two puffs into the lungs every 6 (six) hours as needed for Wheezing or Shortness of Breath., Disp: 18 g, Rfl: 0 .  aspirin  (ECOTRIN LOW DOSE) EC tablet, Take two tablets (162 mg dose) by mouth daily., Disp: , Rfl:  .  budesonide-formoterol (SYMBICORT) 80-4.5 mcg/actuation inhaler, Inhale two puffs into the lungs 2 (two) times daily., Disp: 10.2 g, Rfl: 1 .  busPIRone (BUSPAR) 15 MG tablet, Take one tablet (15 mg dose) by mouth 3 (  three) times a day., Disp: 90 tablet, Rfl: 2 .  cyclobenzaprine  (FLEXERIL ) 10 mg tablet, Take one tablet (10 mg dose) by mouth 2 (two) times a day as needed., Disp: , Rfl:  .  doxylamine -pyridoxine  (DICLEGIS ) 10-10 mg per DR tablet, Take two tablets by mouth at bedtime., Disp: , Rfl:  .  escitalopram oxalate (LEXAPRO) 20 mg tablet, Take one tablet (20 mg dose) by mouth daily., Disp: 30 tablet, Rfl: 2 .  ibuprofen  (ADVIL ,MOTRIN ) 600 mg tablet, Take one tablet (600 mg dose) by mouth every 6 (six) hours as needed., Disp: , Rfl:  .  metoclopramide  HCl (REGLAN ) 10 mg tablet, Take one tablet (10 mg dose) by mouth every 6 (six) hours as needed., Disp: , Rfl:  .  metroNIDAZOLE  (FLAGYL ) 500 mg tablet, Take one tablet (500  mg dose) by mouth 2 (two) times daily., Disp: , Rfl:  .  NIFEdipine  (PROCARDIA  XL) 30 mg 24 hr tablet, Take one tablet (30 mg dose) by mouth daily., Disp: , Rfl:  .  promethazine  (PHENERGAN ) 25 MG tablet, Take one tablet (25 mg dose) by mouth every 6 (six) hours as needed., Disp: , Rfl:  .  scopolamine  (TRANSDERM-SCOP 1.5 MG) 1 MG/3DAYS patch, Place one patch onto the skin every third day., Disp: , Rfl:   Current Facility-Administered Medications:  .  medroxyPROGESTERone (DEPO-PROVERA) injection 150 mg, 150 mg, Intramuscular, Q90 Days, Niall Moreira, PA-C, 150 mg at 07/13/23 1020

## 2024-06-11 ENCOUNTER — Encounter: Payer: Self-pay | Admitting: Obstetrics and Gynecology

## 2024-06-13 LAB — HORIZON CUSTOM: REPORT SUMMARY: POSITIVE — AB

## 2024-06-14 DIAGNOSIS — R898 Other abnormal findings in specimens from other organs, systems and tissues: Secondary | ICD-10-CM | POA: Insufficient documentation

## 2024-06-14 NOTE — Telephone Encounter (Addendum)
 Attempted to call patient regarding recent results. Left VM for patient to call our office back.  Order placed for AMB [facility] genetics referral.  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 thanks ----- Message ----- From: Interface, Lab In Three [facility] Seven Sent: 05/27/2024   1:16 PM EDT To: Bebe Furry, MD

## 2024-06-14 NOTE — Addendum Note (Signed)
 Addended byBETHA WENCESLAO BROWNING on: 06/14/2024 02:27 PM   Modules accepted: Orders

## 2024-06-15 NOTE — Telephone Encounter (Addendum)
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 thanks ----- Message ----- From: Interface, Lab In Three Zero Seven Sent: 05/27/2024   1:16 PM EDT To: Harari Izell, MD   Notifed pt results and to contact [facility] at [phone] to schedule.  Pt verbalized understanding with no further questions.   Delcia Spitzley,RN  06/15/24

## 2024-06-18 ENCOUNTER — Encounter (HOSPITAL_COMMUNITY): Payer: Self-pay | Admitting: Family Medicine

## 2024-06-18 ENCOUNTER — Inpatient Hospital Stay (HOSPITAL_COMMUNITY)
Admission: AD | Admit: 2024-06-18 | Discharge: 2024-06-18 | Disposition: A | Discharge status: Home / Self Care | Source: Ambulatory Visit | Attending: Family Medicine | Admitting: Family Medicine

## 2024-06-18 ENCOUNTER — Other Ambulatory Visit: Payer: Self-pay

## 2024-06-18 ENCOUNTER — Telehealth: Admitting: Nurse Practitioner

## 2024-06-18 DIAGNOSIS — O26891 Other specified pregnancy related conditions, first trimester: Secondary | ICD-10-CM | POA: Insufficient documentation

## 2024-06-18 DIAGNOSIS — R197 Diarrhea, unspecified: Secondary | ICD-10-CM | POA: Diagnosis not present

## 2024-06-18 DIAGNOSIS — R109 Unspecified abdominal pain: Secondary | ICD-10-CM | POA: Insufficient documentation

## 2024-06-18 DIAGNOSIS — R1084 Generalized abdominal pain: Secondary | ICD-10-CM

## 2024-06-18 DIAGNOSIS — Z3491 Encounter for supervision of normal pregnancy, unspecified, first trimester: Secondary | ICD-10-CM

## 2024-06-18 DIAGNOSIS — O99891 Other specified diseases and conditions complicating pregnancy: Secondary | ICD-10-CM | POA: Insufficient documentation

## 2024-06-18 DIAGNOSIS — Z3A13 13 weeks gestation of pregnancy: Secondary | ICD-10-CM

## 2024-06-18 DIAGNOSIS — O219 Vomiting of pregnancy, unspecified: Secondary | ICD-10-CM

## 2024-06-18 DIAGNOSIS — Z3A10 10 weeks gestation of pregnancy: Secondary | ICD-10-CM

## 2024-06-18 DIAGNOSIS — O26899 Other specified pregnancy related conditions, unspecified trimester: Secondary | ICD-10-CM

## 2024-06-18 LAB — URINALYSIS, ROUTINE W REFLEX MICROSCOPIC
Bilirubin Urine: NEGATIVE
Glucose, UA: NEGATIVE mg/dL
Hgb urine dipstick: NEGATIVE
Ketones, ur: NEGATIVE mg/dL
Leukocytes,Ua: NEGATIVE
Nitrite: NEGATIVE
Protein, ur: NEGATIVE mg/dL
Specific Gravity, Urine: 1.024 (ref 1.005–1.030)
pH: 5 (ref 5.0–8.0)

## 2024-06-18 MED ORDER — ONDANSETRON 4 MG PO TBDP
8.0000 mg | ORAL_TABLET | Freq: Once | ORAL | Status: AC
  Administered 2024-06-18: 8 mg via ORAL
  Filled 2024-06-18: qty 2

## 2024-06-18 MED ORDER — LOPERAMIDE HCL 2 MG PO CAPS
4.0000 mg | ORAL_CAPSULE | Freq: Once | ORAL | Status: AC
  Administered 2024-06-18: 4 mg via ORAL
  Filled 2024-06-18: qty 2

## 2024-06-18 MED ORDER — PROMETHAZINE HCL 25 MG PO TABS
25.0000 mg | ORAL_TABLET | Freq: Once | ORAL | Status: AC
  Administered 2024-06-18: 25 mg via ORAL
  Filled 2024-06-18: qty 1

## 2024-06-18 MED ORDER — MORPHINE SULFATE (PF) 4 MG/ML IV SOLN
4.0000 mg | Freq: Once | INTRAVENOUS | Status: AC
  Administered 2024-06-18: 4 mg via INTRAMUSCULAR
  Filled 2024-06-18: qty 1

## 2024-06-18 MED ORDER — PROMETHAZINE HCL 25 MG PO TABS: 25.0000 mg | ORAL_TABLET | Freq: Four times a day (QID) | ORAL | 1 refills | Status: DC | PRN

## 2024-06-18 MED ORDER — ONDANSETRON HCL 4 MG PO TABS: 4.0000 mg | ORAL_TABLET | Freq: Four times a day (QID) | ORAL | 1 refills | Status: DC | PRN

## 2024-06-18 MED ORDER — METOCLOPRAMIDE HCL 10 MG PO TABS: 10.0000 mg | ORAL_TABLET | Freq: Four times a day (QID) | ORAL | 0 refills | Status: DC | PRN

## 2024-06-18 NOTE — MAU Provider Note (Signed)
 History     CSN: [id number]  Arrival date and time: 06/18/24 2019   Event Date/Time   First Provider Initiated Contact with Patient 06/18/24 2057      Chief Complaint  Patient presents with   Abdominal Pain   Emesis   Nausea   Diarrhea    Laura [name] is a 23 y.o. G4P3003 at 101w5d who receives care at [facility]  She presents today for abdominal pain (cramping), diarrhea, nausea, and vomiting.  She states cramping started at 1pm and has gotten progressively worse.  She states she has had 3 bouts of loose diarrhea since 6pm.  She states these incidents made the abdominal cramping worse. She rates the cramping a 6/10. She states the nausea and vomiting has been ongoing throughout the pregnancy despite taking Diclegis , Phenergan , and Reglan .  She states she is unsure of her last dose.  She states she has been unable to keep fluids down x 3 days. She states she has tried eating chicken bites around 11am.    OB History     Gravida  4   Para  3   Term  3   Preterm  0   AB  0   Living  3      SAB  0   IAB  0   Ectopic  0   Multiple  0   Live Births  3           Past Medical History:  Diagnosis Date   Anemia    Anxiety    Asthma    Chlamydia    Depression    Genital herpes    Gonorrhea    Leukopenia 01/07/2021   Ovarian cyst    PID (acute pelvic inflammatory disease)    TOA (tubo-ovarian abscess) 12/28/2023   UTI (urinary tract infection)     Past Surgical History:  Procedure Laterality Date   OVARIAN CYST REMOVAL Left 2020    Family History  Problem Relation Age of Onset   Asthma Mother    Epilepsy Father    Arthritis Maternal Grandmother    Cancer Maternal Grandfather    Diabetes Maternal Grandfather    Kidney disease Maternal Grandfather    Diabetes Maternal Aunt    Anxiety disorder Paternal Aunt    Depression Paternal Aunt    Bleeding Disorder Paternal Aunt     Social History   Tobacco Use   Smoking status: Never   Smokeless  tobacco: Never  Vaping Use   Vaping status: Former  Substance Use Topics   Alcohol use: Not Currently    Alcohol/week: 8.0 - 10.0 standard drinks of alcohol    Types: 8 - 10 Shots of liquor per week    Comment: every other weekend   Drug use: Not Currently    Types: Marijuana    Comment: 2018    Allergies: No Known Allergies  Medications Prior to Admission  Medication Sig Dispense Refill Last Dose/Taking   acetaminophen  (TYLENOL ) 500 MG tablet Take 2 tablets (1,000 mg total) by mouth every 6 (six) hours as needed for moderate pain (pain score 4-6) or mild pain (pain score 1-3).   06/18/2024   acetaminophen -caffeine  (EXCEDRIN TENSION HEADACHE) 500-65 MG TABS per tablet Take 2 tablets by mouth as needed (For headaches). 60 tablet 0 Past Week   albuterol  (PROVENTIL ) (2.5 MG/3ML) 0.083% nebulizer solution Take 3 mLs (2.5 mg total) by nebulization every 6 (six) hours as needed for wheezing or shortness of breath. 75 mL 12  06/17/2024   albuterol  (VENTOLIN  HFA) 108 (90 Base) MCG/ACT inhaler Inhale 2 puffs into the lungs every 6 (six) hours as needed for wheezing or shortness of breath.   06/17/2024   aspirin  EC 81 MG tablet Take 2 tablets (162 mg total) by mouth at bedtime. Start taking when you are [redacted] weeks pregnant for rest of pregnancy for prevention of preeclampsia 300 tablet 2 Past Month   busPIRone (BUSPAR) 15 MG tablet Take 15 mg by mouth 3 (three) times daily.   06/17/2024   cyclobenzaprine  (FLEXERIL ) 10 MG tablet Take 1 tablet (10 mg total) by mouth 2 (two) times daily as needed for muscle spasms. 20 tablet 0 Past Week   Doxylamine -Pyridoxine  (DICLEGIS ) 10-10 MG TBEC Take 2 tablets by mouth at bedtime. May add 1 tablet at breakfast and 1 tablet at lunch if needed. 100 tablet 2 Past Week   escitalopram (LEXAPRO) 20 MG tablet Take 20 mg by mouth daily.   06/17/2024   metroNIDAZOLE  (FLAGYL ) 500 MG tablet Take 1 tablet (500 mg total) by mouth 2 (two) times daily. 14 tablet 0 Past Week    NIFEdipine  (PROCARDIA -XL/NIFEDICAL-XL) 30 MG 24 hr tablet Take 1 tablet (30 mg total) by mouth daily. 30 tablet 2 Past Week   prenatal vitamin w/FE, FA (PRENATAL 1 + 1) 27-1 MG TABS tablet Take 1 tablet by mouth daily at 12 noon. 30 tablet 0 Past Week   promethazine  (PHENERGAN ) 25 MG tablet Take 1 tablet (25 mg total) by mouth every 6 (six) hours as needed for nausea or vomiting. 30 tablet 0 Past Week   budesonide-formoterol (SYMBICORT) 80-4.5 MCG/ACT inhaler Inhale 2 puffs into the lungs.   More than a month   famotidine  (PEPCID ) 20 MG tablet Take 1 tablet (20 mg total) by mouth 2 (two) times daily. (Patient not taking: Reported on 06/02/2024) 60 tablet 3    ferrous sulfate  324 MG TBEC Take 1 tablet (324 mg total) by mouth every other day for 60 doses. (Patient not taking: Reported on 06/18/2024) 60 tablet 0 Not Taking   metoCLOPramide  (REGLAN ) 10 MG tablet Take 1 tablet (10 mg total) by mouth every 6 (six) hours as needed for nausea. 30 tablet 0    ondansetron  (ZOFRAN ) 4 MG tablet Take 1 tablet (4 mg total) by mouth every 6 (six) hours as needed for nausea. (Patient not taking: Reported on 05/26/2024) 20 tablet 0    scopolamine  (TRANSDERM-SCOP) 1 MG/3DAYS Place 1 patch (1 mg total) onto the skin every 3 (three) days. Do not use this with the diclegis . Use this if the diclegis  is not working (Patient not taking: Reported on 06/02/2024) 4 patch 12     Review of Systems  Constitutional:  Negative for chills and fever.  Gastrointestinal:  Positive for abdominal pain, constipation (3 Days ago) and diarrhea. Negative for nausea and vomiting.  Genitourinary:  Negative for difficulty urinating, dysuria, vaginal bleeding and vaginal discharge.  Neurological:  Positive for dizziness and light-headedness. Negative for headaches.   Physical Exam   Blood pressure 122/87, pulse 97, temperature 98.8 F (37.1 C), temperature source Oral, resp. rate 18, height 5' 3 (1.6 m), weight 59.9 kg, SpO2 100%.  Physical  Exam  MAU Course  Procedures Results for orders placed or performed during the hospital encounter of 06/18/24 (from the past 24 hours)  Urinalysis, Routine w reflex microscopic -Urine, Clean Catch     Status: Abnormal   Collection Time: 06/18/24  8:40 PM  Result Value Ref Range  Color, Urine AMBER (A) YELLOW   APPearance HAZY (A) CLEAR   Specific Gravity, Urine 1.024 1.005 - 1.030   pH 5.0 5.0 - 8.0   Glucose, UA NEGATIVE NEGATIVE mg/dL   Hgb urine dipstick NEGATIVE NEGATIVE   Bilirubin Urine NEGATIVE NEGATIVE   Ketones, ur NEGATIVE NEGATIVE mg/dL   Protein, ur NEGATIVE NEGATIVE mg/dL   Nitrite NEGATIVE NEGATIVE   Leukocytes,Ua NEGATIVE NEGATIVE     Patient informed that the ultrasound is considered a limited OB ultrasound and is not intended to be a complete ultrasound exam.  Patient also informed that the ultrasound is not being completed with the intent of assessing for fetal or placental anomalies or any pelvic abnormalities.  Explained that the purpose of today's ultrasound is to assess for  reassurance and viability.  Patient acknowledges the purpose of the exam and the limitations of the study.        MDM Exam Labs: UA Antiemetic Antidiarrhea Pain Medication Prescriptions BSUS Assessment and Plan  23 year old, G4P3003  SIUP at 13.5 weeks Abdominal Cramping N/V Diarrhea  -Reviewed POC with patient. -Exam performed and findings discussed.  -Informed that UA not suggestive of dehydration and would recommend starting with oral medications. Patient agreeable.  -Patient offered and accepts pain medication. -Zofran  f/b morphine  IM -Then will give phenergan  and imodium  for diarrhea. -If improvement will perform BSUS. If no improvement plan to send for formal US  of abdomen.  -Patient agreeable and without questions. -states she drove self, but can get mother to drive her home if necessary.   Harlene LITTIE Duncans 06/18/2024, 8:58 PM   Reassessment (10:15 PM) -Patient  reports resolution of abdominal pain and nausea. -Discussed home medication usage. Patient requests prescription. -Rx refills sent to pharmacy. -Pregnancy safe medications provided in AVS. -BSUS completed showing SIUP with FHR 154. CRL and BPD c/w dates. -Precautions reviewed. -Encouraged to call primary office or return to MAU if symptoms worsen or with the onset of new symptoms. -Discharged to home in improved condition.  Harlene LITTIE Duncans MSN, CNM Advanced Practice Provider, [facility]

## 2024-06-18 NOTE — Discharge Instructions (Signed)

## 2024-06-18 NOTE — Progress Notes (Signed)
 Virtual Visit Consent   Laura [name], you are scheduled for a virtual visit with a [facility] provider today. Just as with appointments in the office, your consent must be obtained to participate. Your consent will be active for this visit and any virtual visit you may have with one of our providers in the next 365 days. If you have a MyChart account, a copy of this consent can be sent to you electronically.  As this is a virtual visit, video technology does not allow for your provider to perform a traditional examination. This may limit your provider's ability to fully assess your condition. If your provider identifies any concerns that need to be evaluated in person or the need to arrange testing (such as labs, EKG, etc.), we will make arrangements to do so. Although advances in technology are sophisticated, we cannot ensure that it will always work on either your end or our end. If the connection with a video visit is poor, the visit may have to be switched to a telephone visit. With either a video or telephone visit, we are not always able to ensure that we have a secure connection.  By engaging in this virtual visit, you consent to the provision of healthcare and authorize for your insurance to be billed (if applicable) for the services provided during this visit. Depending on your insurance coverage, you may receive a charge related to this service.  I need to obtain your verbal consent now. Are you willing to proceed with your visit today? Laura [name] has provided verbal consent on 06/18/2024 for a virtual visit (video or telephone). Haze LELON Servant, NP  Date: 06/18/2024 7:38 PM   Virtual Visit via Video Note   I, Haze LELON Servant, connected with  Laura [name]  ([id number], Nov 15, 2000) on 06/18/24 at  7:15 PM EDT by a video-enabled telemedicine application and verified that I am speaking with the correct person using two identifiers.  Location: Patient: Virtual Visit Location  Patient: Home Provider: Virtual Visit Location Provider: Home Office   I discussed the limitations of evaluation and management by telemedicine and the availability of in person appointments. The patient expressed understanding and agreed to proceed.    History of Present Illness: Laura [name] is a 23 y.o. who identifies as a female who was assigned female at birth, and is being seen today for possible pregnancy related symptoms.  Laura [name] is currently 13 weeks preganant and has been experiencing abdominal cramping and the urge to have frequent bowel movements. Symptoms have been persistent for the past several hours. She is also unable to keep any solids or liquids down.    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 06/03/2024   Hypertension during pregnancy 05/30/2024   Anemia in pregnancy, first trimester 05/26/2024   Nausea and vomiting during pregnancy 05/26/2024   Supervision of low-risk pregnancy 05/17/2024   Biological false positive RPR test 12/30/2023   Abnormal chest CT 12/28/2023   Depressive disorder 03/15/2023   GAD (generalized anxiety disorder) 05/13/2022   PTSD (post-traumatic stress disorder) 05/13/2022   Asthma 08/15/2019    Allergies: No Known Allergies Medications:  Current Outpatient Medications:    acetaminophen  (TYLENOL ) 500 MG tablet, Take 2 tablets (1,000 mg total) by mouth every 6 (six) hours as needed for moderate pain (pain score 4-6) or mild pain (pain score 1-3)., Disp: , Rfl:    acetaminophen -caffeine  (  EXCEDRIN TENSION HEADACHE) 500-65 MG TABS per tablet, Take 2 tablets by mouth as needed (For headaches)., Disp: 60 tablet, Rfl: 0   albuterol  (PROVENTIL ) (2.5 MG/3ML) 0.083% nebulizer solution, Take 3 mLs (2.5 mg total) by nebulization every 6 (six) hours as needed for wheezing or shortness of breath., Disp: 75 mL, Rfl: 12    albuterol  (VENTOLIN  HFA) 108 (90 Base) MCG/ACT inhaler, Inhale 2 puffs into the lungs every 6 (six) hours as needed for wheezing or shortness of breath., Disp: , Rfl:    aspirin  EC 81 MG tablet, Take 2 tablets (162 mg total) by mouth at bedtime. Start taking when you are [redacted] weeks pregnant for rest of pregnancy for prevention of preeclampsia, Disp: 300 tablet, Rfl: 2   budesonide-formoterol (SYMBICORT) 80-4.5 MCG/ACT inhaler, Inhale 2 puffs into the lungs., Disp: , Rfl:    busPIRone (BUSPAR) 15 MG tablet, Take 15 mg by mouth 3 (three) times daily., Disp: , Rfl:    cyclobenzaprine  (FLEXERIL ) 10 MG tablet, Take 1 tablet (10 mg total) by mouth 2 (two) times daily as needed for muscle spasms., Disp: 20 tablet, Rfl: 0   Doxylamine -Pyridoxine  (DICLEGIS ) 10-10 MG TBEC, Take 2 tablets by mouth at bedtime. May add 1 tablet at breakfast and 1 tablet at lunch if needed., Disp: 100 tablet, Rfl: 2   escitalopram (LEXAPRO) 20 MG tablet, Take 20 mg by mouth daily., Disp: , Rfl:    famotidine  (PEPCID ) 20 MG tablet, Take 1 tablet (20 mg total) by mouth 2 (two) times daily. (Patient not taking: Reported on 06/02/2024), Disp: 60 tablet, Rfl: 3   ferrous sulfate  324 MG TBEC, Take 1 tablet (324 mg total) by mouth every other day for 60 doses., Disp: 60 tablet, Rfl: 0   metoCLOPramide  (REGLAN ) 10 MG tablet, Take 1 tablet (10 mg total) by mouth every 6 (six) hours as needed for nausea., Disp: 30 tablet, Rfl: 0   metroNIDAZOLE  (FLAGYL ) 500 MG tablet, Take 1 tablet (500 mg total) by mouth 2 (two) times daily. (Patient not taking: Reported on 06/02/2024), Disp: 14 tablet, Rfl: 0   NIFEdipine  (PROCARDIA -XL/NIFEDICAL-XL) 30 MG 24 hr tablet, Take 1 tablet (30 mg total) by mouth daily. (Patient not taking: Reported on 06/02/2024), Disp: 30 tablet, Rfl: 2   ondansetron  (ZOFRAN ) 4 MG tablet, Take 1 tablet (4 mg total) by mouth every 6 (six) hours as needed for nausea. (Patient not taking: Reported on 05/26/2024), Disp: 20 tablet, Rfl: 0    prenatal vitamin w/FE, FA (PRENATAL 1 + 1) 27-1 MG TABS tablet, Take 1 tablet by mouth daily at 12 noon., Disp: 30 tablet, Rfl: 0   promethazine  (PHENERGAN ) 25 MG tablet, Take 1 tablet (25 mg total) by mouth every 6 (six) hours as needed for nausea or vomiting., Disp: 30 tablet, Rfl: 0   scopolamine  (TRANSDERM-SCOP) 1 MG/3DAYS, Place 1 patch (1 mg total) onto the skin every 3 (three) days. Do not use this with the diclegis . Use this if the diclegis  is not working (Patient not taking: Reported on 06/02/2024), Disp: 4 patch, Rfl: 12  Observations/Objective: Patient is well-developed, well-nourished in no acute distress.  Resting comfortably at home.  Head is normocephalic, atraumatic.  No labored breathing.  Speech is clear and coherent with logical content.  Patient is alert and oriented at baseline.    Assessment and Plan: 1. Stomach cramps, generalized (Primary) Call OB GYN after hours for further instructions  Follow Up Instructions: I discussed the assessment and treatment plan with the patient. The patient was  provided an opportunity to ask questions and all were answered. The patient agreed with the plan and demonstrated an understanding of the instructions.  A copy of instructions were sent to the patient via MyChart unless otherwise noted below.    The patient was advised to call back or seek an in-person evaluation if the symptoms worsen or if the condition fails to improve as anticipated.    Dametria Tuzzolino W [place], NP

## 2024-06-18 NOTE — Patient Instructions (Signed)
 Rolan Molt, thank you for joining Haze LELON Servant, NP for today's virtual visit.  While this provider is not your primary care provider (PCP), if your PCP is located in our provider database this encounter information will be shared with them immediately following your visit.   A [facility] MyChart account gives you access to today's visit and all your visits, tests, and labs performed at [facility]  click here if you don't have a [facility] MyChart account or go to [email or web address]  Consent: (Patient) Laura [name] provided verbal consent for this virtual visit at the beginning of the encounter.  Current Medications:  Current Outpatient Medications:    acetaminophen  (TYLENOL ) 500 MG tablet, Take 2 tablets (1,000 mg total) by mouth every 6 (six) hours as needed for moderate pain (pain score 4-6) or mild pain (pain score 1-3)., Disp: , Rfl:    acetaminophen -caffeine  (EXCEDRIN TENSION HEADACHE) 500-65 MG TABS per tablet, Take 2 tablets by mouth as needed (For headaches)., Disp: 60 tablet, Rfl: 0   albuterol  (PROVENTIL ) (2.5 MG/3ML) 0.083% nebulizer solution, Take 3 mLs (2.5 mg total) by nebulization every 6 (six) hours as needed for wheezing or shortness of breath., Disp: 75 mL, Rfl: 12   albuterol  (VENTOLIN  HFA) 108 (90 Base) MCG/ACT inhaler, Inhale 2 puffs into the lungs every 6 (six) hours as needed for wheezing or shortness of breath., Disp: , Rfl:    aspirin  EC 81 MG tablet, Take 2 tablets (162 mg total) by mouth at bedtime. Start taking when you are [redacted] weeks pregnant for rest of pregnancy for prevention of preeclampsia, Disp: 300 tablet, Rfl: 2   budesonide-formoterol (SYMBICORT) 80-4.5 MCG/ACT inhaler, Inhale 2 puffs into the lungs., Disp: , Rfl:    busPIRone (BUSPAR) 15 MG tablet, Take 15 mg by mouth 3 (three) times daily., Disp: , Rfl:    cyclobenzaprine  (FLEXERIL ) 10 MG tablet, Take 1 tablet (10 mg total) by mouth 2 (two) times daily as needed for muscle  spasms., Disp: 20 tablet, Rfl: 0   Doxylamine -Pyridoxine  (DICLEGIS ) 10-10 MG TBEC, Take 2 tablets by mouth at bedtime. May add 1 tablet at breakfast and 1 tablet at lunch if needed., Disp: 100 tablet, Rfl: 2   escitalopram (LEXAPRO) 20 MG tablet, Take 20 mg by mouth daily., Disp: , Rfl:    famotidine  (PEPCID ) 20 MG tablet, Take 1 tablet (20 mg total) by mouth 2 (two) times daily. (Patient not taking: Reported on 06/02/2024), Disp: 60 tablet, Rfl: 3   ferrous sulfate  324 MG TBEC, Take 1 tablet (324 mg total) by mouth every other day for 60 doses., Disp: 60 tablet, Rfl: 0   metoCLOPramide  (REGLAN ) 10 MG tablet, Take 1 tablet (10 mg total) by mouth every 6 (six) hours as needed for nausea., Disp: 30 tablet, Rfl: 0   metroNIDAZOLE  (FLAGYL ) 500 MG tablet, Take 1 tablet (500 mg total) by mouth 2 (two) times daily. (Patient not taking: Reported on 06/02/2024), Disp: 14 tablet, Rfl: 0   NIFEdipine  (PROCARDIA -XL/NIFEDICAL-XL) 30 MG 24 hr tablet, Take 1 tablet (30 mg total) by mouth daily. (Patient not taking: Reported on 06/02/2024), Disp: 30 tablet, Rfl: 2   ondansetron  (ZOFRAN ) 4 MG tablet, Take 1 tablet (4 mg total) by mouth every 6 (six) hours as needed for nausea. (Patient not taking: Reported on 05/26/2024), Disp: 20 tablet, Rfl: 0   prenatal vitamin w/FE, FA (PRENATAL 1 + 1) 27-1 MG TABS tablet, Take 1 tablet by mouth daily at 12 noon., Disp: 30 tablet, Rfl: 0  promethazine  (PHENERGAN ) 25 MG tablet, Take 1 tablet (25 mg total) by mouth every 6 (six) hours as needed for nausea or vomiting., Disp: 30 tablet, Rfl: 0   scopolamine  (TRANSDERM-SCOP) 1 MG/3DAYS, Place 1 patch (1 mg total) onto the skin every 3 (three) days. Do not use this with the diclegis . Use this if the diclegis  is not working (Patient not taking: Reported on 06/02/2024), Disp: 4 patch, Rfl: 12   Medications ordered in this encounter:  No orders of the defined types were placed in this encounter.    *If you need refills on other medications  prior to your next appointment, please contact your pharmacy*  Follow-Up: Call back or seek an in-person evaluation if the symptoms worsen or if the condition fails to improve as anticipated.  [facility] [phone]  Other Instructions Call OB GYN after hours for further instructions   If you have been instructed to have an in-person evaluation today at a local Urgent Care facility, please use the link below. It will take you to a list of all of our available [facility], including address, phone number and hours of operation. Please do not delay care.  [facility]  If you or a family member do not have a primary care provider, use the link below to schedule a visit and establish care. When you choose a [facility] primary care physician or advanced practice provider, you gain a long-term partner in health. Find a Primary Care Provider  Learn more about [facility]'s in-office and virtual care options: [facility] - Get Care Now

## 2024-06-18 NOTE — MAU Note (Signed)
 MAU Triage Note:   .Laura [name] is a 23 y.o. at [redacted]w[redacted]d here in MAU reporting: lower abdominal pain accompanied with nausea, vomiting, and diarrhea that first began around 1300 today. Reports 3 episodes of diarrhea and 4 episodes of vomiting. Has not been able to keep anything down for the past 3 days. Has not taken any medications today due to needing to refill her prescription.   Patient complaint: intense ABD cramping, diarrhea  Pain Score: 6  Pain Location: Abdomen     Onset of complaint: past several days LMP: No LMP recorded. Patient is pregnant.  Vitals:   06/18/24 2029  BP: 122/87  Pulse: 97  Resp: 18  Temp: 98.8 F (37.1 C)  SpO2: 100%    FHT:  Fetal Heart Rate Mode: Doppler Baseline Rate (A): 158 bpm Multiple birth?: No Lab orders placed from triage: UA

## 2024-06-20 ENCOUNTER — Other Ambulatory Visit: Payer: Self-pay

## 2024-06-20 ENCOUNTER — Ambulatory Visit: Admitting: Advanced Practice Midwife

## 2024-06-20 VITALS — BP 126/90 | HR 89 | Wt 137.5 lb

## 2024-06-20 DIAGNOSIS — Z3491 Encounter for supervision of normal pregnancy, unspecified, first trimester: Secondary | ICD-10-CM

## 2024-06-20 DIAGNOSIS — Z3A10 10 weeks gestation of pregnancy: Secondary | ICD-10-CM

## 2024-06-20 DIAGNOSIS — O26892 Other specified pregnancy related conditions, second trimester: Secondary | ICD-10-CM

## 2024-06-20 DIAGNOSIS — R109 Unspecified abdominal pain: Secondary | ICD-10-CM | POA: Diagnosis not present

## 2024-06-20 DIAGNOSIS — O26899 Other specified pregnancy related conditions, unspecified trimester: Secondary | ICD-10-CM

## 2024-06-20 DIAGNOSIS — O219 Vomiting of pregnancy, unspecified: Secondary | ICD-10-CM

## 2024-06-20 DIAGNOSIS — Z3A14 14 weeks gestation of pregnancy: Secondary | ICD-10-CM

## 2024-06-20 MED ORDER — SCOPOLAMINE 1 MG/3DAYS TD PT72: 1.0000 | MEDICATED_PATCH | TRANSDERMAL | 12 refills | Status: DC

## 2024-06-20 NOTE — Progress Notes (Signed)
 PRENATAL VISIT NOTE  Subjective:  Laura [name] is a 23 y.o. [phone] at [redacted]w[redacted]d being seen today for ongoing prenatal care.  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  Patient reports abdominal cramping, vomitin 4-5 times per day.  Contractions: Irregular. Vag. Bleeding: None.  Movement: Present. Denies leaking of fluid.   The following portions of the patient's history were reviewed and updated as appropriate: allergies, current medications, past family history, past medical history, past social history, past surgical history and problem list.   Objective:    Vitals:   06/20/24 1150  BP: (!) 126/90  Pulse: 89  Weight: 137 lb 8 oz (62.4 kg)    Fetal Status:  Fetal Heart Rate (bpm): 160   Movement: Present    General: Alert, oriented and cooperative. Patient is in no acute distress.  Skin: Skin is warm and dry. No rash noted.   Cardiovascular: Normal heart rate noted  Respiratory: Normal respiratory effort, no problems with respiration noted  Abdomen: Soft, gravid, appropriate for gestational age.  Pain/Pressure: Present     Pelvic: Cervical exam deferred        Extremities: Normal range of motion.  Edema: Trace  Mental Status: Normal mood and affect. Normal behavior. Normal judgment and thought content.   Assessment and Plan:  Pregnancy: G4P3003 at [redacted]w[redacted]d 1. Encounter for supervision of low-risk pregnancy in first trimester (Primary) --Anticipatory guidance about next visits/weeks of pregnancy given.  --Pt job has policies  about working with chemicals or wearing respirator during pregnancy.  Since pt is unable to perform the duties of the job, I provided a letter for her to be out of work (unpaid) for the duration of the pregnancy. She will then qualify for paid maternity leave through her job, and can return after that to full duties.    2. Nausea and vomiting during pregnancy prior to [redacted] weeks gestation --Resent Rx for scopolamine  patches, pt taking Digegis, Zofran , Phenergan  as prescribed.  -D/C Diclegis  if using patch  3. Abdominal cramping affecting pregnancy --Unchanged, no more diarrhea. Pain associated with vomiting, likely r/t dehydration and/or MSK pain.  See management of n/v.   4. [redacted] weeks gestation of pregnancy   Preterm labor symptoms and general obstetric precautions including but not limited to vaginal bleeding, contractions, leaking of fluid and fetal movement were reviewed in detail with the patient. Please refer to After Visit Summary for other counseling recommendations.   Return in about 4 weeks (around 07/18/2024).  Future Appointments  Date Time Provider Department Center  06/28/2024  3:00 PM [facility] NURSE [facility]  07/19/2024  9:00 AM CENTERING PROVIDER [facility]  07/29/2024  8:00 AM [facility] PROVIDER 1 [facility]  07/29/2024  8:30 AM [facility] US1 [facility]  08/16/2024  9:00 AM CENTERING PROVIDER [facility]  09/13/2024  9:00 AM CENTERING PROVIDER [facility]  09/27/2024  9:00 AM CENTERING PROVIDER [facility]  10/11/2024  9:00 AM CENTERING PROVIDER [facility]  10/25/2024  9:00 AM CENTERING PROVIDER [facility]  11/08/2024  9:00 AM CENTERING PROVIDER [facility]  11/22/2024  9:00 AM CENTERING PROVIDER [facility]  11/29/2024  9:00 AM CENTERING PROVIDER [facility]  12/06/2024  9:00 AM CENTERING PROVIDER [facility]    Olam Boards, CNM

## 2024-06-21 DIAGNOSIS — Z0289 Encounter for other administrative examinations: Secondary | ICD-10-CM

## 2024-06-22 ENCOUNTER — Telehealth: Payer: Self-pay

## 2024-06-22 NOTE — Telephone Encounter (Signed)
 Attempt to call pt to clarify if FMLA paperwork is for during pregnancy or after birth.  [place], CNM provided pt letter on 06/20/24 to be out of work due to chemical danger risk at her job.  Voicemail box full, unable to leave voicemail.    Waddell,  RN

## 2024-06-28 ENCOUNTER — Ambulatory Visit: Admitting: *Deleted

## 2024-06-28 ENCOUNTER — Other Ambulatory Visit: Payer: Self-pay

## 2024-06-28 VITALS — BP 117/73 | HR 115 | Wt 132.3 lb

## 2024-06-28 DIAGNOSIS — Z3490 Encounter for supervision of normal pregnancy, unspecified, unspecified trimester: Secondary | ICD-10-CM

## 2024-06-28 DIAGNOSIS — Z0130 Encounter for examination of blood pressure without abnormal findings: Secondary | ICD-10-CM

## 2024-06-28 NOTE — Progress Notes (Signed)
 Here for BP check , states has not been able to get BP med from pharmacy due to issues at pharmacy. States had headaches 2 x week =10, takes tylenol  without relief. No edema noted. BPat home are in [employer] and on 9/20 was 122/87. States her children broke her bp machine and she is Media planner. BP wnl today RN called pharmacy re: Nifedipine  and they informed me they will get it ready for patient to pick up tomorrow. Schedule patient for BP check in one week. Instructed patient to call if needed.  Rock Skip PEAK

## 2024-07-01 ENCOUNTER — Telehealth: Payer: Self-pay

## 2024-07-01 NOTE — Telephone Encounter (Addendum)
 RN returned pt call.  Pt requested FMLA form #7 be filled in with date of 06/03/24 and provider note from 06/20/24 visit be refaxed to her employer.  RN read back these request to pt for clarification.  Pt verbally confirmed she permits provider note be faxed and had no further questions at this time.  Paper work faxed per pt request.    Waddell, RN

## 2024-07-01 NOTE — Telephone Encounter (Signed)
 Pt left voicemail stating her FMLA paperwork question #17 was left blank, her first absence was 06/03/24.    Waddell, RN

## 2024-07-07 ENCOUNTER — Ambulatory Visit (INDEPENDENT_AMBULATORY_CARE_PROVIDER_SITE_OTHER)

## 2024-07-07 ENCOUNTER — Other Ambulatory Visit: Payer: Self-pay

## 2024-07-07 VITALS — BP 118/84 | HR 104 | Ht 63.0 in | Wt 132.9 lb

## 2024-07-07 DIAGNOSIS — Z0130 Encounter for examination of blood pressure without abnormal findings: Secondary | ICD-10-CM

## 2024-07-07 DIAGNOSIS — Z0289 Encounter for other administrative examinations: Secondary | ICD-10-CM

## 2024-07-07 LAB — POCT URINALYSIS DIP (DEVICE)
Bilirubin Urine: NEGATIVE
Glucose, UA: NEGATIVE mg/dL
Hgb urine dipstick: NEGATIVE
Ketones, ur: NEGATIVE mg/dL
Leukocytes,Ua: NEGATIVE
Nitrite: NEGATIVE
Protein, ur: 30 mg/dL — AB
Specific Gravity, Urine: 1.025 (ref 1.005–1.030)
Urobilinogen, UA: 0.2 mg/dL (ref 0.0–1.0)
pH: 6.5 (ref 5.0–8.0)

## 2024-07-07 NOTE — Progress Notes (Signed)
 Laura [name] is here today for a BP check after starting procardia . Patient denies headache, blurry vision, dizziness, and peripheral edema. Patient didn't take procardia  today, but states she will take it tonight.  BP 118/84.   Patient states she is having abdominal cramping but its feels like contractions. Also, she states she had two bowel movements last night. She states the first bowel movement was bloody diarrhea and the next bowel movement was orange diarrhea.   Reviewed chart with provider. Provider advised patient to be seen at MAU if she is having contractions, increase water intake and drink fluids with electrolytes to prevent dehydration.   Devon, RN  07/07/24

## 2024-07-10 ENCOUNTER — Encounter

## 2024-07-11 ENCOUNTER — Inpatient Hospital Stay (HOSPITAL_COMMUNITY)
Admission: AD | Admit: 2024-07-11 | Discharge: 2024-07-11 | Disposition: A | Discharge status: Home / Self Care | Attending: Obstetrics and Gynecology | Admitting: Obstetrics and Gynecology

## 2024-07-11 ENCOUNTER — Other Ambulatory Visit: Payer: Self-pay | Admitting: Family Medicine

## 2024-07-11 ENCOUNTER — Encounter (HOSPITAL_COMMUNITY): Payer: Self-pay | Admitting: Obstetrics and Gynecology

## 2024-07-11 DIAGNOSIS — O10912 Unspecified pre-existing hypertension complicating pregnancy, second trimester: Secondary | ICD-10-CM | POA: Insufficient documentation

## 2024-07-11 DIAGNOSIS — R10A1 Flank pain, right side: Secondary | ICD-10-CM | POA: Insufficient documentation

## 2024-07-11 DIAGNOSIS — F32A Depression, unspecified: Secondary | ICD-10-CM | POA: Diagnosis not present

## 2024-07-11 DIAGNOSIS — O219 Vomiting of pregnancy, unspecified: Secondary | ICD-10-CM | POA: Insufficient documentation

## 2024-07-11 DIAGNOSIS — O99322 Drug use complicating pregnancy, second trimester: Secondary | ICD-10-CM | POA: Insufficient documentation

## 2024-07-11 DIAGNOSIS — Z9152 Personal history of nonsuicidal self-harm: Secondary | ICD-10-CM | POA: Insufficient documentation

## 2024-07-11 DIAGNOSIS — Z3A17 17 weeks gestation of pregnancy: Secondary | ICD-10-CM | POA: Diagnosis not present

## 2024-07-11 DIAGNOSIS — Z79899 Other long term (current) drug therapy: Secondary | ICD-10-CM | POA: Diagnosis not present

## 2024-07-11 DIAGNOSIS — O99512 Diseases of the respiratory system complicating pregnancy, second trimester: Secondary | ICD-10-CM | POA: Diagnosis present

## 2024-07-11 DIAGNOSIS — F4310 Post-traumatic stress disorder, unspecified: Secondary | ICD-10-CM | POA: Diagnosis not present

## 2024-07-11 DIAGNOSIS — R45851 Suicidal ideations: Secondary | ICD-10-CM | POA: Diagnosis not present

## 2024-07-11 DIAGNOSIS — Z7951 Long term (current) use of inhaled steroids: Secondary | ICD-10-CM | POA: Insufficient documentation

## 2024-07-11 DIAGNOSIS — M542 Cervicalgia: Secondary | ICD-10-CM | POA: Diagnosis not present

## 2024-07-11 DIAGNOSIS — O99342 Other mental disorders complicating pregnancy, second trimester: Secondary | ICD-10-CM | POA: Insufficient documentation

## 2024-07-11 DIAGNOSIS — F419 Anxiety disorder, unspecified: Secondary | ICD-10-CM | POA: Insufficient documentation

## 2024-07-11 DIAGNOSIS — R519 Headache, unspecified: Secondary | ICD-10-CM | POA: Diagnosis not present

## 2024-07-11 DIAGNOSIS — O98812 Other maternal infectious and parasitic diseases complicating pregnancy, second trimester: Secondary | ICD-10-CM | POA: Insufficient documentation

## 2024-07-11 DIAGNOSIS — O26892 Other specified pregnancy related conditions, second trimester: Secondary | ICD-10-CM | POA: Diagnosis not present

## 2024-07-11 DIAGNOSIS — Z09 Encounter for follow-up examination after completed treatment for conditions other than malignant neoplasm: Secondary | ICD-10-CM | POA: Insufficient documentation

## 2024-07-11 DIAGNOSIS — B379 Candidiasis, unspecified: Secondary | ICD-10-CM | POA: Diagnosis not present

## 2024-07-11 DIAGNOSIS — Z87891 Personal history of nicotine dependence: Secondary | ICD-10-CM | POA: Insufficient documentation

## 2024-07-11 DIAGNOSIS — Z7982 Long term (current) use of aspirin: Secondary | ICD-10-CM | POA: Diagnosis not present

## 2024-07-11 LAB — CBC WITH DIFFERENTIAL/PLATELET
Abs Immature Granulocytes: 0.02 K/uL (ref 0.00–0.07)
Basophils Absolute: 0 K/uL (ref 0.0–0.1)
Basophils Relative: 0 %
Eosinophils Absolute: 0 K/uL (ref 0.0–0.5)
Eosinophils Relative: 0 %
HCT: 31.5 % — ABNORMAL LOW (ref 36.0–46.0)
Hemoglobin: 10.1 g/dL — ABNORMAL LOW (ref 12.0–15.0)
Immature Granulocytes: 0 %
Lymphocytes Relative: 18 %
Lymphs Abs: 0.9 K/uL (ref 0.7–4.0)
MCH: 22.7 pg — ABNORMAL LOW (ref 26.0–34.0)
MCHC: 32.1 g/dL (ref 30.0–36.0)
MCV: 70.8 fL — ABNORMAL LOW (ref 80.0–100.0)
Monocytes Absolute: 0.3 K/uL (ref 0.1–1.0)
Monocytes Relative: 6 %
Neutro Abs: 3.5 K/uL (ref 1.7–7.7)
Neutrophils Relative %: 76 %
Platelets: 163 K/uL (ref 150–400)
RBC: 4.45 MIL/uL (ref 3.87–5.11)
RDW: 14 % (ref 11.5–15.5)
WBC: 4.7 K/uL (ref 4.0–10.5)
nRBC: 0 % (ref 0.0–0.2)

## 2024-07-11 LAB — COMPREHENSIVE METABOLIC PANEL WITH GFR
ALT: 10 U/L (ref 0–44)
AST: 18 U/L (ref 15–41)
Albumin: 2.8 g/dL — ABNORMAL LOW (ref 3.5–5.0)
Alkaline Phosphatase: 37 U/L — ABNORMAL LOW (ref 38–126)
Anion gap: 8 (ref 5–15)
BUN: 7 mg/dL (ref 6–20)
CO2: 22 mmol/L (ref 22–32)
Calcium: 8.8 mg/dL — ABNORMAL LOW (ref 8.9–10.3)
Chloride: 104 mmol/L (ref 98–111)
Creatinine, Ser: 0.64 mg/dL (ref 0.44–1.00)
GFR, Estimated: >60 mL/min (ref >60)
Glucose, Bld: 78 mg/dL (ref 70–99)
Potassium: 3.6 mmol/L (ref 3.5–5.1)
Sodium: 134 mmol/L — ABNORMAL LOW (ref 135–145)
Total Bilirubin: 0.5 mg/dL (ref 0.0–1.2)
Total Protein: 6.5 g/dL (ref 6.5–8.1)

## 2024-07-11 LAB — URINALYSIS, ROUTINE W REFLEX MICROSCOPIC
Bilirubin Urine: NEGATIVE
Glucose, UA: NEGATIVE mg/dL
Hgb urine dipstick: NEGATIVE
Ketones, ur: 5 mg/dL — AB
Leukocytes,Ua: NEGATIVE
Nitrite: NEGATIVE
Protein, ur: NEGATIVE mg/dL
Specific Gravity, Urine: 1.003 — ABNORMAL LOW (ref 1.005–1.030)
pH: 6 (ref 5.0–8.0)

## 2024-07-11 LAB — WET PREP, GENITAL
Clue Cells Wet Prep HPF POC: NONE SEEN
Sperm: NONE SEEN
Trich, Wet Prep: NONE SEEN
WBC, Wet Prep HPF POC: <10 (ref <10)

## 2024-07-11 MED ORDER — DIPHENHYDRAMINE HCL 25 MG PO CAPS
50.0000 mg | ORAL_CAPSULE | Freq: Once | ORAL | Status: AC
Start: 2024-07-11 — End: 2024-07-11
  Administered 2024-07-11: 50 mg via ORAL
  Filled 2024-07-11: qty 2

## 2024-07-11 MED ORDER — FAMOTIDINE 20 MG PO TABS
20.0000 mg | ORAL_TABLET | Freq: Two times a day (BID) | ORAL | 5 refills | Status: DC
Start: 2024-07-11 — End: 2024-07-11

## 2024-07-11 MED ORDER — METOCLOPRAMIDE HCL 10 MG PO TABS: 10.0000 mg | ORAL_TABLET | Freq: Four times a day (QID) | ORAL | 3 refills | Status: AC | PRN

## 2024-07-11 MED ORDER — DIPHENHYDRAMINE HCL 25 MG PO TABS: 25.0000 mg | ORAL_TABLET | Freq: Four times a day (QID) | ORAL | 0 refills | Status: DC | PRN

## 2024-07-11 MED ORDER — CYCLOBENZAPRINE HCL 10 MG PO TABS: 10.0000 mg | ORAL_TABLET | Freq: Two times a day (BID) | ORAL | 2 refills | Status: AC | PRN

## 2024-07-11 MED ORDER — METOCLOPRAMIDE HCL 10 MG PO TABS
10.0000 mg | ORAL_TABLET | Freq: Once | ORAL | Status: AC
  Administered 2024-07-11: 10 mg via ORAL
  Filled 2024-07-11: qty 1

## 2024-07-11 MED ORDER — CYCLOBENZAPRINE HCL 5 MG PO TABS
10.0000 mg | ORAL_TABLET | Freq: Once | ORAL | Status: AC
  Administered 2024-07-11: 10 mg via ORAL
  Filled 2024-07-11: qty 2

## 2024-07-11 MED ORDER — FLUCONAZOLE 150 MG PO TABS: 150.0000 mg | ORAL_TABLET | Freq: Once | ORAL | 0 refills | Status: AC

## 2024-07-11 MED ORDER — ONDANSETRON HCL 4 MG PO TABS: 8.0000 mg | ORAL_TABLET | Freq: Four times a day (QID) | ORAL | 3 refills | Status: AC | PRN

## 2024-07-11 MED ORDER — PROMETHAZINE HCL 25 MG PO TABS: 25.0000 mg | ORAL_TABLET | Freq: Four times a day (QID) | ORAL | 1 refills | Status: AC

## 2024-07-11 MED ORDER — ACETAMINOPHEN-CAFFEINE 500-65 MG PO TABS
2.0000 | ORAL_TABLET | Freq: Once | ORAL | Status: AC
  Administered 2024-07-11: 2 via ORAL
  Filled 2024-07-11: qty 2

## 2024-07-11 NOTE — MAU Note (Signed)
 Laura [name] is a 23 y.o. at [redacted]w[redacted]d here in MAU reporting: been having abd pain and bad migraines.  This one has been lasting for 3 days.  Cramping is on the rt, lower side.  Not a new problem.  Denies any bleeding. Had some loose stools 2 days ago. No problems with urinaiton.  Took Excedrin for the HA, last was last night, it doesn't help at all. Onset of complaint: 3days ago Pain score: HA 7, abd 5 Vitals:   07/11/24 1142  BP: 119/85  Pulse: 92  Resp: 16  Temp: 98.4 F (36.9 C)  SpO2: 99%     FHT:157 Lab orders placed from triage:

## 2024-07-11 NOTE — Discharge Instructions (Signed)
 It is imperative that you follow through with treatment recommendations within 5-7 days from the day of discharge to mitigate further risk to your safety and overall mental well-being.  A list of outpatient therapy and psychiatric providers for medication management has been provided below to get you started in finding the right provider for you.            [facility] [address] [place], [place], [address] [phone] phone (Medicare, Private insurance except Tricare, [place] Adwolf, and [facility])  [facility] [address] [place], [place], [address] [address] phone ([address], AmeriHealth [facility] [place], [address], [place], [place], Friday Health Plans, [address], BCBS Healthy [place], [place], [address], [place], [place], [place], [place], Tricare, [facility], [employer]co Corporation, Eli Lilly and Company)  [employer] [phone] [address], Suite [place], [place], [address] [phone] phone [phone] phone [phone] fax  [facility] [address] [place], [place], [address] [phone] phone (Call to confirm insurance coverage) Consultation & Support Services     o Drop-In Hours: 1:00 PM to 5:00 PM     o Days: Monday - Thursday  Crisis Services (24/7)   [facility] [address] [place], [place], [address] [phone] phone ([address] [place], [place], [place] Medicaid, [place] and [place], [facility])      [facility] [address] [place], [place], [address] [phone] phone [email or web address]  (to complete the intake form and upload ID and insurance cards)  [facility] [address] [place], [place], [address] [phone] phone ([address], [address], [place], [address] [employer], [place], [place], [place], [facility], [place], and certain Medicaid plans)  [facility] [phone] [address] [place], [place], [address] [phone] phone [phone] fax (Medicaid, Medicare, Self-pay, call about other insurance coverage)  [facility] (age 85+) [address] [place], [place], [address] [phone] hone [phone] fax ([place], [address], [place], [place], [place], [address], [place], [place], [place], [place], certain [employer], [facility], UMR)  [employer] [address] [place], [place], [address] [phone] phone (Medicare, Medicaid, Artemio Aly, call about other insurance coverage)  [facility] [address] [place], [place], [address] [phone] phone [phone] fax (Call [phone] to see what insurance is accepted) Archer Asa, MD specializes in geropsych)  [facility], [facility]  (medication management only) [address] [place], [place], [address] [phone] phone [phone] fax ([address], Medicaid, [place], [place], [place], [place], [place], [place], [place])  [facility] (medication management only) [address] [place], [place], [address] [phone] phone [phone] fax ([address] Medicare, [place], [place], Tricare [place])  [facility] [address] [place], [place], [address] [phone] phone [phone] fax ([address], Tea Collums, [place], [place], [place], [facility], [facility] Medicaid/[place] Health Choice)  [facility] [place], [employer]. [address] [place], [place], [address] [phone] phone [phone] fax (Medicare, Medicaid, [facility])  [facility] [address] [place], [place] [address] [phone] phone ([address], [place], [place], [place], [place], Medicare, Ages, [facility]) Does genetic testing for medications; does transcranial magnetic stimulation along with basic services)  [facility] [address] [place], [place],  [address] [phone] phone (Call about insurance coverage)  [facility] [address] [place], [place], [address] [phone] phone [phone] fax (Call about insurance coverage)  [facility] [address] [place], [place], [address] [phone] phone [phone]  fax (Call about insurance coverage)  [employer] [phone] [address], [place], [address] [phone] phone (Medicaid, Tricare, [place], [place], [place])  [employer] [address] [place], [place], [address] [phone] phone (Medicaid, Medicare, call about other insurance coverage)  [facility] [address] [place], [place], [address] [phone] phone [phone] fax (Medicaid, Medicare, Tricare, call about other insurance coverage)  [facility] [address] [place], [address] [phone] phone ([address], [place], [place], [place], [place] types - Alliance, Secretary/administrator, Partners, [place], [place] Health Choice, Healthy [place], [place], [place], and Complete)  [facility] [address] [place], [place], [address] [phone] phone Completely online treatment platform Contact: Personal assistant - [employer] Specialist [phone] phone [phone] fax ([address], [place], [place], Friday Health Plan, [place], [place], [place], [place], [place], [place])

## 2024-07-11 NOTE — Progress Notes (Signed)
 C. Mardy, NP into see patient for psychiatric consult.

## 2024-07-11 NOTE — MAU Provider Note (Signed)
 Chief Complaint:  Abdominal Pain and Headache   HPI   None     Laura [name] is a 23 y.o. [phone] at [redacted]w[redacted]d who presents to maternity admissions reporting right flank pain and headache.  She reports headache has been ongoing for the past 3 days.  She has tried Excedrin tension, states it does not help at all.  The pain is on the right side behind her eye and radiating down the back of her neck. She has not been able to eat anything for 2 days due to nausea. She states that right flank cramping is not a new problem.  She endorses loose stools 2 days ago, denies dysuria, nausea, vomiting, vaginal bleeding, vaginal discharge.  Patient is also wondering about admission to the hospital for her mental health.  She states that her depression has been very bad recently and she has had thoughts of hurting herself or killing herself.  She does not have any intent or plan.  Pregnancy Course: Receives care at [facility] for [facility] . Prenatal records reviewed.  Pregnancy complicated by hypertension, asthma, nausea/vomiting.  Past Medical History:  Diagnosis Date   Anemia    Anxiety    Asthma    Chlamydia    Depression    Genital herpes    Gonorrhea    Leukopenia 01/07/2021   Ovarian cyst    PID (acute pelvic inflammatory disease)    TOA (tubo-ovarian abscess) 12/28/2023   UTI (urinary tract infection)    OB History  Gravida Para Term Preterm AB Living  4 3 3  0 0 3  SAB IAB Ectopic Multiple Live Births  0 0 0 0 3    # Outcome Date GA Lbr Len/2nd Weight Sex Type Anes PTL Lv  4 Current           3 Term 09/02/21 [redacted]w[redacted]d / 00:09 2685 g F Vag-Spont EPI N LIV  2 Term 04/12/20 [redacted]w[redacted]d 03:45 / 00:51 3204 g M Vag-Spont EPI N LIV  1 Term 01/30/18 [redacted]w[redacted]d 03:24 / 00:30 3182 g F Vag-Spont EPI N LIV   Past Surgical History:  Procedure Laterality Date   OVARIAN CYST REMOVAL Left 2020   Family History  Problem Relation Age of Onset   Asthma Mother    Epilepsy Father     Arthritis Maternal Grandmother    Cancer Maternal Grandfather    Diabetes Maternal Grandfather    Kidney disease Maternal Grandfather    Diabetes Maternal Aunt    Anxiety disorder Paternal Aunt    Depression Paternal Aunt    Bleeding Disorder Paternal Aunt    Social History   Tobacco Use   Smoking status: Never   Smokeless tobacco: Never  Vaping Use   Vaping status: Former  Substance Use Topics   Alcohol use: Not Currently    Alcohol/week: 8.0 - 10.0 standard drinks of alcohol    Types: 8 - 10 Shots of liquor per week    Comment: every other weekend   Drug use: Not Currently    Types: Marijuana    Comment: 2018   No Known Allergies Medications Prior to Admission  Medication Sig Dispense Refill Last Dose/Taking   albuterol  (PROVENTIL ) (2.5 MG/3ML) 0.083% nebulizer solution Take 3 mLs (2.5 mg total) by nebulization every 6 (six) hours as needed for wheezing or shortness of breath. 75 mL 12 Past Month   albuterol  (VENTOLIN  HFA) 108 (90 Base) MCG/ACT inhaler Inhale 2 puffs into the lungs every 6 (six) hours as  needed for wheezing or shortness of breath.   07/10/2024   budesonide-formoterol (SYMBICORT) 80-4.5 MCG/ACT inhaler Inhale 2 puffs into the lungs.   Past Week   busPIRone (BUSPAR) 15 MG tablet Take 15 mg by mouth 3 (three) times daily.   07/10/2024   Doxylamine -Pyridoxine  (DICLEGIS ) 10-10 MG TBEC Take 2 tablets by mouth at bedtime. May add 1 tablet at breakfast and 1 tablet at lunch if needed. 100 tablet 2 07/10/2024   escitalopram (LEXAPRO) 20 MG tablet Take 20 mg by mouth daily.   07/10/2024   NIFEdipine  (PROCARDIA -XL/NIFEDICAL-XL) 30 MG 24 hr tablet Take 1 tablet (30 mg total) by mouth daily. 30 tablet 2 07/10/2024   prenatal vitamin w/FE, FA (PRENATAL 1 + 1) 27-1 MG TABS tablet Take 1 tablet by mouth daily at 12 noon. 30 tablet 0 07/10/2024   [DISCONTINUED] metoCLOPramide  (REGLAN ) 10 MG tablet Take 1 tablet (10 mg total) by mouth every 6 (six) hours as needed for nausea. 30  tablet 0 07/11/2024   [DISCONTINUED] ondansetron  (ZOFRAN ) 4 MG tablet Take 1-2 tablets (4-8 mg total) by mouth every 6 (six) hours as needed for nausea. 30 tablet 1 07/10/2024   acetaminophen  (TYLENOL ) 500 MG tablet Take 2 tablets (1,000 mg total) by mouth every 6 (six) hours as needed for moderate pain (pain score 4-6) or mild pain (pain score 1-3).      acetaminophen -caffeine  (EXCEDRIN TENSION HEADACHE) 500-65 MG TABS per tablet Take 2 tablets by mouth as needed (For headaches). (Patient not taking: Reported on 07/07/2024) 60 tablet 0    aspirin  EC 81 MG tablet Take 2 tablets (162 mg total) by mouth at bedtime. Start taking when you are [redacted] weeks pregnant for rest of pregnancy for prevention of preeclampsia (Patient not taking: Reported on 07/07/2024) 300 tablet 2    scopolamine  (TRANSDERM-SCOP) 1 MG/3DAYS Place 1 patch (1 mg total) onto the skin every 3 (three) days. Do not use this with the diclegis . Use this if the diclegis  is not working 4 patch 12 07/09/2024   [DISCONTINUED] cyclobenzaprine  (FLEXERIL ) 10 MG tablet Take 1 tablet (10 mg total) by mouth 2 (two) times daily as needed for muscle spasms. (Patient not taking: Reported on 07/07/2024) 20 tablet 0    [DISCONTINUED] promethazine  (PHENERGAN ) 25 MG tablet Take 1 tablet (25 mg total) by mouth every 6 (six) hours as needed for nausea or vomiting. 30 tablet 1 07/09/2024    I have reviewed patient's Past Medical Hx, Surgical Hx, Family Hx, Social Hx, medications and allergies.   ROS  Pertinent items noted in HPI and remainder of comprehensive ROS otherwise negative.   PHYSICAL EXAM  Patient Vitals for the past 24 hrs:  BP Temp Temp src Pulse Resp SpO2 Height Weight  07/11/24 1554 109/68 98.4 F (36.9 C) -- 86 18 100 % -- --  07/11/24 1205 124/83 -- -- 90 18 98 % -- --  07/11/24 1142 119/85 98.4 F (36.9 C) Oral 92 16 99 % 5' 3 (1.6 m) 59.6 kg    Constitutional: Well-developed, well-nourished female in no acute distress.  HEENT:  atraumatic, normocephalic. Neck has normal ROM. EOM intact. Cardiovascular: normal rate & rhythm, warm and well-perfused Respiratory: normal effort, no problems with respiration noted GI: Abd soft, non-tender, non-distended MSK: Extremities nontender, no edema, normal ROM Skin: warm and dry. Acyanotic, no jaundice or pallor. Neurologic: Alert and oriented x 4. No abnormal coordination. Psychiatric: Normal mood. Tearful affect. Speech not slurred, not rapid/pressured. Patient is cooperative. GU: no CVA tenderness Pelvic  exam: deferred  Labs: Results for orders placed or performed during the hospital encounter of 07/11/24 (from the past 24 hours)  CBC with Differential/Platelet     Status: Abnormal   Collection Time: 07/11/24 12:02 PM  Result Value Ref Range   WBC 4.7 4.0 - 10.5 K/uL   RBC 4.45 3.87 - 5.11 MIL/uL   Hemoglobin 10.1 (L) 12.0 - 15.0 g/dL   HCT 68.4 (L) 63.9 - 53.9 %   MCV 70.8 (L) 80.0 - 100.0 fL   MCH 22.7 (L) 26.0 - 34.0 pg   MCHC 32.1 30.0 - 36.0 g/dL   RDW 85.9 88.4 - 84.4 %   Platelets 163 150 - 400 K/uL   nRBC 0.0 0.0 - 0.2 %   Neutrophils Relative % 76 %   Neutro Abs 3.5 1.7 - 7.7 K/uL   Lymphocytes Relative 18 %   Lymphs Abs 0.9 0.7 - 4.0 K/uL   Monocytes Relative 6 %   Monocytes Absolute 0.3 0.1 - 1.0 K/uL   Eosinophils Relative 0 %   Eosinophils Absolute 0.0 0.0 - 0.5 K/uL   Basophils Relative 0 %   Basophils Absolute 0.0 0.0 - 0.1 K/uL   Immature Granulocytes 0 %   Abs Immature Granulocytes 0.02 0.00 - 0.07 K/uL  Comprehensive metabolic panel     Status: Abnormal   Collection Time: 07/11/24 12:02 PM  Result Value Ref Range   Sodium 134 (L) 135 - 145 mmol/L   Potassium 3.6 3.5 - 5.1 mmol/L   Chloride 104 98 - 111 mmol/L   CO2 22 22 - 32 mmol/L   Glucose, Bld 78 70 - 99 mg/dL   BUN 7 6 - 20 mg/dL   Creatinine, Ser 9.35 0.44 - 1.00 mg/dL   Calcium 8.8 (L) 8.9 - 10.3 mg/dL   Total Protein 6.5 6.5 - 8.1 g/dL   Albumin 2.8 (L) 3.5 - 5.0 g/dL   AST  18 15 - 41 U/L   ALT 10 0 - 44 U/L   Alkaline Phosphatase 37 (L) 38 - 126 U/L   Total Bilirubin 0.5 0.0 - 1.2 mg/dL   GFR, Estimated >39 >39 mL/min   Anion gap 8 5 - 15  Wet prep, genital     Status: Abnormal   Collection Time: 07/11/24 12:38 PM   Specimen: PATH Cytology Cervicovaginal Ancillary Only  Result Value Ref Range   Yeast Wet Prep HPF POC PRESENT (A) NONE SEEN   Trich, Wet Prep NONE SEEN NONE SEEN   Clue Cells Wet Prep HPF POC NONE SEEN NONE SEEN   WBC, Wet Prep HPF POC <10 <10   Sperm NONE SEEN     Imaging:  No results found.  MDM & MAU COURSE  MDM: High  MAU Course: -Vital signs within normal limits.  -Stat TTS consult for suicidal ideation without intent or plan. -CBC and CMP for potential GI pathology. -UA and wet prep to rule out infection.  -Excedrin Tension and cyclobenzaprine , push PO fluids for headache. If ineffective, can add Reglan  or Benadryl . Consider sumatriptan if true migraine. -CBC and CMP within normal limits for pregnancy. -Wet prep positive for vulvovaginal candidiasis, will treat with fluconazole . -Headache improved from 7/10 to 6/10. Adding Reglan  and Benadryl  for nausea/headache. If no improvement in headache at that point, will try sumatriptan.  -Psychiatry provider saw patient and approved for safe discharge home, see separate note. -Discussed adjusting psychiatric medications and/or scheduling outpatient Behavioral Health appointment. Patient states she already meets with a therapist/psychiatrist  regularly, her next appointment is in 2 days. She plans to discuss medication adjustments at that time.  Differential diagnosis considered for headache includes but is not limited to: preeclampsia, tension headache, cluster, trauma, concussion, migraine, CVA/SAH, viral syndrome Differential diagnosis considered for flank pain includes but is not limited to: UTI, pyelonephritis, nephrolithiasis, urethritis/cervicitis, dehydration, constipation, MSK pain     Orders Placed This Encounter  Procedures   Wet prep, genital   CBC with Differential/Platelet   Comprehensive metabolic panel   Urinalysis, Routine w reflex microscopic -Urine, Clean Catch   Encourage/Reinforce Importance Po Fluids   Consult to psychiatry   Discharge patient   Meds ordered this encounter  Medications   acetaminophen -caffeine  (EXCEDRIN TENSION HEADACHE) 500-65 MG per tablet 2 tablet   cyclobenzaprine  (FLEXERIL ) tablet 10 mg   metoCLOPramide  (REGLAN ) tablet 10 mg   diphenhydrAMINE  (BENADRYL ) capsule 50 mg   famotidine  (PEPCID ) 20 MG tablet    Sig: Take 1 tablet (20 mg total) by mouth 2 (two) times daily.    Dispense:  30 tablet    Refill:  5   promethazine  (PHENERGAN ) 25 MG tablet    Sig: Take 1 tablet (25 mg total) by mouth every 6 (six) hours. For baseline nausea control.    Dispense:  30 tablet    Refill:  1   cyclobenzaprine  (FLEXERIL ) 10 MG tablet    Sig: Take 1 tablet (10 mg total) by mouth 2 (two) times daily as needed (headache, muscle spasms).    Dispense:  20 tablet    Refill:  2   ondansetron  (ZOFRAN ) 4 MG tablet    Sig: Take 2 tablets (8 mg total) by mouth every 6 (six) hours as needed for nausea.    Dispense:  30 tablet    Refill:  3   metoCLOPramide  (REGLAN ) 10 MG tablet    Sig: Take 1 tablet (10 mg total) by mouth every 6 (six) hours as needed for nausea.    Dispense:  30 tablet    Refill:  3   diphenhydrAMINE  (BENADRYL ) 25 MG tablet    Sig: Take 1-2 tablets (25-50 mg total) by mouth every 6 (six) hours as needed (headache, nausea).    Dispense:  30 tablet    Refill:  0   fluconazole  (DIFLUCAN ) 150 MG tablet    Sig: Take 1 tablet (150 mg total) by mouth once for 1 dose. Can take additional dose three days later if symptoms persist    Dispense:  1 tablet    Refill:  0    ASSESSMENT   1. Pregnancy headache in second trimester   2. Nausea and vomiting during pregnancy   3. [redacted] weeks gestation of pregnancy     PLAN  Discharge home in  stable condition with return precautions.  Message sent to [facility] provider [facility] with update on nausea/vomiting and mental health.  Diclegis  and scopolamine  ineffective for nausea, so discontinued. Start Phenergan  scheduled every 6 hours for baseline control, plus famotidine  daily. Zofran  and Reglan  for breakthrough nausea. Fluconazole  x1 for yeast infection. Benadryl  sent as last resort treatment for headache and nausea if needed.    Allergies as of 07/11/2024   No Known Allergies      Medication List     STOP taking these medications    acetaminophen -caffeine  500-65 MG Tabs per tablet Commonly known as: EXCEDRIN TENSION HEADACHE   Doxylamine -Pyridoxine  10-10 MG Tbec Commonly known as: Diclegis    scopolamine  1 MG/3DAYS Commonly known as: TRANSDERM-SCOP  TAKE these medications    acetaminophen  500 MG tablet Commonly known as: TYLENOL  Take 2 tablets (1,000 mg total) by mouth every 6 (six) hours as needed for moderate pain (pain score 4-6) or mild pain (pain score 1-3).   albuterol  108 (90 Base) MCG/ACT inhaler Commonly known as: VENTOLIN  HFA Inhale 2 puffs into the lungs every 6 (six) hours as needed for wheezing or shortness of breath.   albuterol  (2.5 MG/3ML) 0.083% nebulizer solution Commonly known as: PROVENTIL  Take 3 mLs (2.5 mg total) by nebulization every 6 (six) hours as needed for wheezing or shortness of breath.   aspirin  EC 81 MG tablet Take 2 tablets (162 mg total) by mouth at bedtime. Start taking when you are [redacted] weeks pregnant for rest of pregnancy for prevention of preeclampsia   budesonide-formoterol 80-4.5 MCG/ACT inhaler Commonly known as: SYMBICORT Inhale 2 puffs into the lungs.   busPIRone 15 MG tablet Commonly known as: BUSPAR Take 15 mg by mouth 3 (three) times daily.   cyclobenzaprine  10 MG tablet Commonly known as: FLEXERIL  Take 1 tablet (10 mg total) by mouth 2 (two) times daily as needed (headache, muscle spasms). What  changed: reasons to take this   diphenhydrAMINE  25 MG tablet Commonly known as: BENADRYL  Take 1-2 tablets (25-50 mg total) by mouth every 6 (six) hours as needed (headache, nausea).   escitalopram 20 MG tablet Commonly known as: LEXAPRO Take 20 mg by mouth daily.   famotidine  20 MG tablet Commonly known as: PEPCID  Take 1 tablet (20 mg total) by mouth 2 (two) times daily.   fluconazole  150 MG tablet Commonly known as: DIFLUCAN  Take 1 tablet (150 mg total) by mouth once for 1 dose. Can take additional dose three days later if symptoms persist   metoCLOPramide  10 MG tablet Commonly known as: REGLAN  Take 1 tablet (10 mg total) by mouth every 6 (six) hours as needed for nausea.   NIFEdipine  30 MG 24 hr tablet Commonly known as: PROCARDIA -XL/NIFEDICAL-XL Take 1 tablet (30 mg total) by mouth daily.   ondansetron  4 MG tablet Commonly known as: ZOFRAN  Take 2 tablets (8 mg total) by mouth every 6 (six) hours as needed for nausea. What changed: how much to take   Prenatal 27-1 MG Tabs Take 1 tablet by mouth daily at 12 noon.   promethazine  25 MG tablet Commonly known as: PHENERGAN  Take 1 tablet (25 mg total) by mouth every 6 (six) hours. For baseline nausea control. What changed:  when to take this reasons to take this additional instructions        Joesph DELENA Sear, PA

## 2024-07-11 NOTE — Progress Notes (Signed)
 RN notified Dr. Merilee regarding when psychiatric consult will be performed.  RN told by MD that patient will be seen tomorrow considering it's after 1200 noon.  Dr. Merilee informed by this RN that pt is in MAU (Obstetric ED) and more than likely would be discharged prior to tomorrow secondary pregnancy complaints will have been addressed.  Dr. Merilee states he will reach out to provider in ED regarding pt being seen today.
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
## 2024-07-12 LAB — GC/CHLAMYDIA PROBE AMP (~~LOC~~) NOT AT ARMC
Chlamydia: NEGATIVE
Comment: NEGATIVE
Comment: NORMAL
Neisseria Gonorrhea: NEGATIVE

## 2024-07-19 ENCOUNTER — Ambulatory Visit (INDEPENDENT_AMBULATORY_CARE_PROVIDER_SITE_OTHER): Admitting: Advanced Practice Midwife

## 2024-07-19 ENCOUNTER — Other Ambulatory Visit: Payer: Self-pay

## 2024-07-19 VITALS — BP 123/91 | HR 108 | Wt 136.0 lb

## 2024-07-19 DIAGNOSIS — O10912 Unspecified pre-existing hypertension complicating pregnancy, second trimester: Secondary | ICD-10-CM

## 2024-07-19 DIAGNOSIS — M549 Dorsalgia, unspecified: Secondary | ICD-10-CM

## 2024-07-19 DIAGNOSIS — O0990 Supervision of high risk pregnancy, unspecified, unspecified trimester: Secondary | ICD-10-CM

## 2024-07-19 DIAGNOSIS — Z3A18 18 weeks gestation of pregnancy: Secondary | ICD-10-CM

## 2024-07-19 DIAGNOSIS — O26899 Other specified pregnancy related conditions, unspecified trimester: Secondary | ICD-10-CM

## 2024-07-19 DIAGNOSIS — R519 Headache, unspecified: Secondary | ICD-10-CM

## 2024-07-19 DIAGNOSIS — R109 Unspecified abdominal pain: Secondary | ICD-10-CM

## 2024-07-19 DIAGNOSIS — R7689 Other specified abnormal immunological findings in serum: Secondary | ICD-10-CM

## 2024-07-19 DIAGNOSIS — R399 Unspecified symptoms and signs involving the genitourinary system: Secondary | ICD-10-CM | POA: Diagnosis not present

## 2024-07-19 DIAGNOSIS — O26892 Other specified pregnancy related conditions, second trimester: Secondary | ICD-10-CM

## 2024-07-19 DIAGNOSIS — O99891 Other specified diseases and conditions complicating pregnancy: Secondary | ICD-10-CM

## 2024-07-19 DIAGNOSIS — O10919 Unspecified pre-existing hypertension complicating pregnancy, unspecified trimester: Secondary | ICD-10-CM | POA: Insufficient documentation

## 2024-07-19 DIAGNOSIS — O0992 Supervision of high risk pregnancy, unspecified, second trimester: Secondary | ICD-10-CM

## 2024-07-19 NOTE — Progress Notes (Addendum)
 PRENATAL VISIT NOTE- Centering Pregnancy Cycle 24 , Session # 1  Subjective:  Laura [name] is a 23 y.o. [phone] at [redacted]w[redacted]d being seen today for ongoing prenatal care through Centering Pregnancy.  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  Patient reports pelvic/abdominal pain, headaches.  Contractions: Irritability. Vag. Bleeding: None.  Movement: Present. Denies leaking of fluid/ROM.   The following portions of the patient's history were reviewed and updated as appropriate: allergies, current medications, past family history, past medical history, past social history, past surgical history and problem list. Problem list updated.  Objective:   Vitals:   07/19/24 0942  BP: (!) 123/91  Pulse: (!) 108  Weight: 136 lb (61.7 kg)    Fetal Status: Fetal Heart Rate (bpm): 154   Movement: Present     General:  Alert, oriented and cooperative. Patient is in no acute distress.  Skin: Skin is warm and dry. No rash noted.   Cardiovascular: Normal heart rate noted  Respiratory: Normal respiratory effort, no problems with respiration noted  Abdomen: Soft, gravid, appropriate for gestational age.  Pain/Pressure: Present     Pelvic: Cervical exam deferred        Extremities: Normal range of motion.  Edema: None  Mental Status: Normal mood and affect. Normal behavior. Normal judgment and thought content.   Assessment and Plan:  Pregnancy: [id number] at [redacted]w[redacted]d  1. Supervision of high risk pregnancy, antepartum --Anticipatory guidance about next  visits/weeks of pregnancy given.    Centering Pregnancy, Session#1: Introduction to model of care. Group determined rules for self-governance and closing phrase. Oriented group to space and mother's notebook.   Facilitated discussion today:  common discomforts, When to call practice  Mindfulness activity completed as well as introduction to deep breathing for childbirth preparation- Centering 3 Breaths  Fundal height and FHR appropriate today unless noted otherwise in plan of care. Patient to continue group care.    2. [redacted] weeks gestation of pregnancy (Primary)  - AFP, Serum, Open Spina Bifida  3.  - Culture, OB Urine  4. Back pain affecting pregnancy in second trimester --Rest/ice/heat/warm bath/increase PO fluids/Tylenol /pregnancy support belt  --Urine sent for culture  - AMB referral to rehabilitation  5. Pregnancy headache in second trimester --Reviewed tx options, increase PO fluids, small amounts of caffeine , Tylenol , continue Flexeril  as prescribed.  6. Abdominal cramping affecting pregnancy --See back pain above  7. Biological false positive RPR test --Titer 1:2 with negative T-Pal. Positive prior to pregnancy with titer 1:1.  Will consult with MD and likely repeat testing at future visit.   8. Chronic hypertension affecting pregnancy --On BASA and Procardia , discussed Procardia  and h/a, pt to notify if h/a worse since taking medication.      Preterm labor symptoms and general obstetric precautions including but not limited to vaginal bleeding, contractions, leaking of fluid and fetal movement were reviewed in detail with the patient. Please refer to After Visit Summary for other counseling recommendations.  Return for Centering Sessions as scheduled.  Future Appointments  Date Time Provider Department Center  07/29/2024  8:00 AM [facility] PROVIDER 1 [facility]  07/29/2024  8:30 AM [facility] US1 [facility]  [facility]  07/29/2024 10:30 AM [facility] GENETIC COUNSELING RM  [facility]  08/16/2024  9:00 AM CENTERING PROVIDER [facility]  09/13/2024  9:00 AM CENTERING PROVIDER [facility]  09/27/2024  9:00 AM CENTERING PROVIDER [facility]  10/11/2024  9:00 AM CENTERING PROVIDER [facility]  10/25/2024  9:00 AM CENTERING PROVIDER [facility]  11/08/2024  9:00 AM CENTERING PROVIDER [facility]  11/22/2024  9:00 AM CENTERING PROVIDER [facility]  11/29/2024  9:00 AM CENTERING PROVIDER [facility]  12/06/2024  9:00 AM CENTERING PROVIDER [facility]    Olam Boards, CNM

## 2024-07-20 DIAGNOSIS — Z0289 Encounter for other administrative examinations: Secondary | ICD-10-CM

## 2024-07-21 LAB — AFP, SERUM, OPEN SPINA BIFIDA
AFP MoM: 2.05
AFP Value: 104.6 ng/mL
Gest. Age on Collection Date: 18.1 wk
Maternal Age At EDD: 25.6 a
OSBR Risk 1 IN: 1443
Test Results:: NEGATIVE
Weight: 136 [lb_av]

## 2024-07-21 LAB — URINE CULTURE, OB REFLEX

## 2024-07-21 LAB — CULTURE, OB URINE

## 2024-07-23 NOTE — Progress Notes (Signed)
 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  We explained to the pt that we recommend that she can work with a respirator.  Pt states that the nurses told her that she can not work with a respirator even when we provided her with a note stating that she can.  I explained to the pt that I would reach out to her disability representative and let them know that she can work with a respirator.  Pt verbalized understanding.  Spoke with Sueanne, disability rep, and informed her that pt can work with a respirator and that I will discard the paperwork that was submitted.   Sueanne stated that she would inform the employer.    Mandeep Kiser,RN

## 2024-07-29 ENCOUNTER — Ambulatory Visit (HOSPITAL_BASED_OUTPATIENT_CLINIC_OR_DEPARTMENT_OTHER)

## 2024-07-29 ENCOUNTER — Other Ambulatory Visit: Payer: Self-pay | Admitting: *Deleted

## 2024-07-29 ENCOUNTER — Ambulatory Visit: Admitting: Maternal & Fetal Medicine

## 2024-07-29 ENCOUNTER — Ambulatory Visit: Attending: Obstetrics and Gynecology

## 2024-07-29 VITALS — BP 133/85 | HR 107

## 2024-07-29 DIAGNOSIS — O10012 Pre-existing essential hypertension complicating pregnancy, second trimester: Secondary | ICD-10-CM | POA: Insufficient documentation

## 2024-07-29 DIAGNOSIS — O99512 Diseases of the respiratory system complicating pregnancy, second trimester: Secondary | ICD-10-CM | POA: Diagnosis not present

## 2024-07-29 DIAGNOSIS — R898 Other abnormal findings in specimens from other organs, systems and tissues: Secondary | ICD-10-CM | POA: Diagnosis not present

## 2024-07-29 DIAGNOSIS — Z148 Genetic carrier of other disease: Secondary | ICD-10-CM | POA: Insufficient documentation

## 2024-07-29 DIAGNOSIS — O10919 Unspecified pre-existing hypertension complicating pregnancy, unspecified trimester: Secondary | ICD-10-CM | POA: Insufficient documentation

## 2024-07-29 DIAGNOSIS — J45909 Unspecified asthma, uncomplicated: Secondary | ICD-10-CM

## 2024-07-29 DIAGNOSIS — D563 Thalassemia minor: Secondary | ICD-10-CM | POA: Insufficient documentation

## 2024-07-29 DIAGNOSIS — O99011 Anemia complicating pregnancy, first trimester: Secondary | ICD-10-CM

## 2024-07-29 DIAGNOSIS — Z3A19 19 weeks gestation of pregnancy: Secondary | ICD-10-CM | POA: Insufficient documentation

## 2024-07-29 DIAGNOSIS — Z3490 Encounter for supervision of normal pregnancy, unspecified, unspecified trimester: Secondary | ICD-10-CM | POA: Diagnosis present

## 2024-07-29 DIAGNOSIS — O2692 Pregnancy related conditions, unspecified, second trimester: Secondary | ICD-10-CM | POA: Diagnosis not present

## 2024-07-29 DIAGNOSIS — Z3492 Encounter for supervision of normal pregnancy, unspecified, second trimester: Secondary | ICD-10-CM

## 2024-07-29 NOTE — Progress Notes (Signed)
 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 G4P3003 at [redacted]w[redacted]d here for ultrasound and consultation. She had low risk aneuploidy screening of a female fetus. Carrier screening was +beta thal. Maternal serum AFP was negative. She has no acute concerns.   Today we focused on the following:   Antibody positive: A nonspecific reactivity in the antiglobulin phase of testing.  Repeat titer in 1 month.  Discussed the clinical implications of alloimmunization during pregnancy.  Currently there is low risk to the fetus given the low antibody titer.  CHTN: Discussed the clinical significance of proper blood pressure control during pregnancy as well as the risk of superimposed preeclampsia.  Blood pressure goal is less than 140/90.  Her antihypertensive medication is okay to take during pregnancy she is compliant with Procardia .  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  Genetic counseling to be done after today's visit.  Anemia: Likely due to a combination of hemoglobinopathy as well as iron deficiency anemia.  She required multiple IV infusions in the previous pregnancy.  Due  to the low MCV a complete workup was recommended.  I do not see where this is done.  Ferritin and CBC should be checked every 4 to 8 weeks with infusions as necessary.   RE asthma in pregnancy: The patient reports a history of childhood asthma that has continued to into adulthood.  She uses albuterol  nebulizer treatment as needed.  She reports that she has been very short of breath this pregnancy.  I encouraged her to obtain a peak flow meter to see if this represents the dyspnea of pregnancy versus actual asthma exacerbation.  Currently she denies wheezing, cough or rhinitis.  I discussed that asthma typically does well in pregnancy but up to a third can have worsening symptoms.  We also discussed that most pregnancies are not at increased risk for growth restriction or preterm birth as long as asthma is well-controlled.  If she requires albuterol  inhaler use more than twice a week or ever at night she should have a low-dose and inhaled corticosteroid.   Review of Systems: A review of systems was performed and was negative except per HPI   Ultrasound findings Single intrauterine pregnancy at 19w 4d  Fetal cardiac activity:  Observed and appears normal. Presentation: Cephalic. The anatomic structures that were well seen appear normal. Due to poor acoustic windows some structures remain suboptimally visualized. Fetal biometry shows the estimated fetal weight at the 59 percentile.  Amniotic fluid: Within normal limits.  MVP: 4.22 cm. Placenta: Posterior. Adnexa: No abnormality visualized. Cervical length: 4.1 cm.  There are limitations of prenatal ultrasound such as the inability to detect certain abnormalities due to poor visualization. Various factors such as fetal position, gestational age and  maternal body habitus may increase the difficulty in visualizing the fetal anatomy.    Recommendations - EDD should be 12/19/2024 based on  Early Ultrasound  (04/26/24). -Baseline peak flow meter is  encouraged as well as albuterol  inhaler as needed -Avoid triggers of asthmas (e.g. allergies, dust, infections) -If albuterol  inhaler is needed more than twice a week or ever at night then an inhaled corticosteroid should be prescribed - Anatomy ultrasound was done today with the above findings (see report). - Aspirin  81-162 mg continued throughout the pregnancy for preeclampsia prophylaxis. - Baseline preeclampsia labs: CMP, CBC, urine protein creatinine ratio if not previously completed. Repeat with any concerns for preeclampsia. - Additional labs: Hemoglobin electrophoresis, iron studies, B12, folate. - Iron infusions as necessary to keep the ferritin in the normal range (greater than 30) - Blood pressure goal of < 140 systolic and < 90 diastolic.  Continue Procardia . - Serial growth ultrasounds every 4 weeks starting at 24 to 28 weeks until delivery. - Antenatal testing (usually weekly BPP or NST) weekly at 32 weeks until delivery. - Repeat RPR every trimester due to history of false positive. - Delivery likely around 37-[redacted] weeks gestation or sooner if indicated.  Review of Systems: A review of systems was performed and was negative except per HPI   Past Obstetrical History:  OB History  Gravida Para Term Preterm AB Living  4 3 3  0 0 3  SAB IAB Ectopic Multiple Live Births  0 0 0 0 3    # Outcome Date GA Lbr Len/2nd Weight Sex Type Anes PTL Lv  4 Current           3 Term 09/02/21 [redacted]w[redacted]d / 00:09 5 lb 14.7 oz (2.685 kg) F Vag-Spont EPI N LIV  2 Term 04/12/20 [redacted]w[redacted]d 03:45 / 00:51 7 lb 1 oz (3.204 kg) M Vag-Spont EPI N LIV  1 Term 01/30/18 [redacted]w[redacted]d 03:24 / 00:30 7 lb 0.2 oz (3.182 kg) F Vag-Spont EPI N LIV     Past Medical History:  Past Medical History:  Diagnosis Date   Anemia    Anxiety    Asthma    Chlamydia    Depression    Genital herpes    Gonorrhea    Leukopenia 01/07/2021   Ovarian cyst    PID (acute pelvic inflammatory disease)    TOA (tubo-ovarian abscess) 12/28/2023    UTI (urinary tract infection)      Past Surgical History:    Past Surgical History:  Procedure Laterality Date   OVARIAN CYST REMOVAL Left 2020     Home Medications:   Current Outpatient Medications on File Prior to Visit  Medication Sig Dispense Refill   acetaminophen  (TYLENOL ) 500 MG tablet Take 2 tablets (1,000 mg total) by mouth every 6 (six) hours as needed for moderate pain (pain score 4-6) or mild pain (pain score 1-3).     albuterol  (VENTOLIN  HFA) 108 (90 Base) MCG/ACT inhaler Inhale 2 puffs into the lungs every 6 (six) hours as needed for wheezing or shortness of breath.     aspirin  EC 81 MG tablet Take 2 tablets (162 mg total) by mouth at bedtime. Start taking when you are [redacted] weeks pregnant for rest of pregnancy for prevention of preeclampsia 300 tablet 2   budesonide-formoterol (SYMBICORT) 80-4.5 MCG/ACT inhaler Inhale 2 puffs into the lungs.     busPIRone (BUSPAR) 15 MG tablet Take 15 mg by mouth 3 (three) times daily.     cyclobenzaprine  (FLEXERIL ) 10 MG tablet Take  1 tablet (10 mg total) by mouth 2 (two) times daily as needed (headache, muscle spasms). 20 tablet 2   diphenhydrAMINE  (BENADRYL ) 25 MG tablet Take 1-2 tablets (25-50 mg total) by mouth every 6 (six) hours as needed (headache, nausea). 30 tablet 0   escitalopram (LEXAPRO) 20 MG tablet Take 20 mg by mouth daily.     metoCLOPramide  (REGLAN ) 10 MG tablet Take 1 tablet (10 mg total) by mouth every 6 (six) hours as needed for nausea. 30 tablet 3   NIFEdipine  (PROCARDIA -XL/NIFEDICAL-XL) 30 MG 24 hr tablet Take 1 tablet (30 mg total) by mouth daily. 30 tablet 2   omeprazole (PRILOSEC) 20 MG capsule Take 1 capsule (20 mg total) by mouth daily. 1 tablet a day 30 capsule 6   ondansetron  (ZOFRAN ) 4 MG tablet Take 2 tablets (8 mg total) by mouth every 6 (six) hours as needed for nausea. 30 tablet 3   prenatal vitamin w/FE, FA (PRENATAL 1 + 1) 27-1 MG TABS tablet Take 1 tablet by mouth daily at 12 noon. 30 tablet 0    promethazine  (PHENERGAN ) 25 MG tablet Take 1 tablet (25 mg total) by mouth every 6 (six) hours. For baseline nausea control. 30 tablet 1   albuterol  (PROVENTIL ) (2.5 MG/3ML) 0.083% nebulizer solution Take 3 mLs (2.5 mg total) by nebulization every 6 (six) hours as needed for wheezing or shortness of breath. 75 mL 12   No current facility-administered medications on file prior to visit.      Allergies:   No Known Allergies   Physical Exam:   Vitals:   07/29/24 0808  BP: 133/85  Pulse: (!) 107   Sitting comfortably on the sonogram table Nonlabored breathing Normal rate and rhythm Abdomen is nontender  Thank you for the opportunity to be involved with this patient's care. Please let us  know if we can be of any further assistance.   65 minutes of time was spent reviewing the patient's chart including labs, imaging and documentation.  At least 50% of this time was spent with direct patient care discussing the diagnosis, management and prognosis of her care.  Delora Smaller MFM, [facility]   07/29/2024  9:21 AM

## 2024-07-29 NOTE — Progress Notes (Signed)
 [facility] at [facility] [address] Phone:  [phone]   Fax:  [phone]      In-Person Genetic Counseling Clinic Note:   I spoke with 23 y.o. Laura [name] today to discuss her carrier screening results. She was referred by Izell Harari, MD. She was accompanied by FOB JaQuan.   Pregnancy History:    [id number]. EGA: [redacted]w[redacted]d by US . EDD: 12/19/2024. Laura [name] has two healthy daughters and a healthy son. Personal history of asthma. Denies other major personal health concerns. Denies bleeding, infections, and fevers in this pregnancy. Denies using tobacco, alcohol, or street drugs in this pregnancy.   Family History:    A three-generation pedigree was created and scanned into Epic under the Media tab.  Patient reports her father has epilepsy that began when he was a teenager. She says it is severe and he is unable to work or drive due to his condition. She reports it may have been caused by a brain injury. Seizures can occur secondary to a variety of environmental, lifestyle, and genetic factors. When epilepsy does not have an identified genetic cause, the chance that a first degree relative of someone with epilepsy will also have or develop epilepsy is approximately 2-5%. Given that the patient's father is a third degree relative to her fetus, recurrence risk for epilepsy may be slightly elevated over the general population risk of 1%. However, without knowing the etiology of the seizures in the family, precise risk assessment is limited.  Patient reports her maternal aunt has a 63 yo son with autism and intellectual disability. He is otherwise healthy. We also discussed that we are unable to directly test for autism in a pregnancy. Genetic testing for individuals with a clinical diagnosis of autism yields an explanation in only about 30% of cases, and the remaining 70% of cases are left with unknown etiology. Having an affected  family member may increase the chance that Laura [name]'s children will have autism; however, without genetic testing performed on affected family members, it is difficult to assess risk to the pregnancy and other family members. The risk may be up to 50% in the case of an identified genetic cause. We also discussed and offered screening for fragile X syndrome, which is one of the most common causes of inherited intellectual disability and autism in males. Fragile X syndrome affects females as well. Laura [name] declined fragile X syndrome carrier screening.  Patient ethnicity reported as Black/Hispanic and FOB ethnicity reported as Black. Denies Ashkenazi Jewish ancestry.  Family history otherwise not remarkable for consanguinity, individuals with birth defects, intellectual disability, autism spectrum disorder, multiple spontaneous abortions, still births, or unexplained neonatal death.   Maternal Carrier for Beta-Thalassemia:  Of the four conditions screened for, Laura [name]  was found to be a carrier for beta-thalassemia, as she is positive for the pathogenic variant c.*110T>C in one of her HBB genes. Carriers of beta-thalassemia may have mild to moderate anemia. Her variant is known as a mild ?+ variant. It is also described as Poly A (T>C). Mild ?+ variants can result in a moderate to severe phenotype when compound heterozygous with a ?0 variant. Of note, she was not found to be a carrier for the other conditions screened for (CF and alpha thalassemia) which greatly reduces but does not eliminate the chance of being a carrier. Her carrier screening did not result for SMA; therefore, she elects to be redrawn at the same time she will have  her blood drawn for the repeat antibody screening. Please see report for details.   We reviewed beta globin genes, hemoglobin, forms of beta-hemoglobinopathies and their natural histories, and the autosomal mode of inheritance. We discussed the variable phenotypes of  beta-thalassemia and beta-hemoglobinopathies. Beta-thalassemia is a type of beta-hemoglobinopathy that causes a qualitative decrease or loss of the beta-subunits in hemoglobin due to pathogenic variants in the HBB gene. Other types of beta-hemoglobinopathies are due to structural differences in hemoglobin (e.g. hemoglobin S, hemoglobin, C, hemoglobin E, etc.) due to pathogenic variants in the HBB gene. If Satsuki's partner were found to be a carrier for a beta-hemoglobinopathy, there would be a 25% chance their future pregnancies would be affected with a beta-hemoglobinopathy or beta-thalassemia. The phenotype of affected individuals would be dependent on the combination of pathogenic variants.  Carrier screening was discussed and offered for her partner. We reviewed the benefits and limitations of carrier screening and that it can detect most but not all carriers. FOB proceeded with carrier screening. He consented we call Nalea with the results. If he is found to be a carrier, diagnostic testing through amniocentesis would be available. The technical aspects, benefits, risks, and limitations including the 1 in 500 risk for miscarriage were reviewed. The patient declined amniocentesis.   We also discussed that if carrier screening/diagnostic testing is not desired, then testing for their future children can be completed postnatally. Laura [name] 's Newborn Screening Program screens for beta-hemoglobinopathies including abnormal hemoglobin (S, C, D, and E). We discussed that genetic testing for the HBB gene can also be performed after birth if necessary.   In the meantime, we calculated the chance their future pregnancies would be affected with a beta-hemoglobinopathy. Given Laura [name]'s carrier screening results and her partner's ethnicity, there would be an up to 1 in 40 (~2.5%) chance their future pregnancies would be affected with a beta-hemoglobinopathy. Carrier screening for FOB will allow us  to  provide a more accurate risk to the fetus.    Newborn Screening. The   Newborn Screening (NBS) program will screen all newborn babies for cystic fibrosis, spinal muscular atrophy, hemoglobinopathies, and numerous other conditions.  Previous Testing Completed:  Low risk NIPS: Laura [name] previously completed [facility] noninvasive prenatal screening (NIPS) in this pregnancy. The result is low risk, consistent with a female fetus. This screening significantly reduces but does not eliminate the chance that the current pregnancy has Down syndrome (trisomy 14), trisomy 46, trisomy 74, and common sex chromosome conditions. Please see report for details. There are many genetic conditions that cannot be detected by NIPS.   Negative ms-AFP screening: Laura [name] previously completed a maternal serum AFP screen in this pregnancy. The result is screen negative. Please see report for details. A negative result reduces the risk that the current pregnancy has an open neural tube defect. Closed neural tube defects and some open defects may not be detected by this screen.   Plan of Care:   FOB carrier screening drawn today. We will call the patient with the results. As her carrier screening did not result for SMA, she elected to have a redraw during her next lab appointment at her OB office. This can be ordered as Education Administrator with redraw written on the requisition form. Follow-up MFC ultrasound on 09/06/2024.   Informed consent was obtained. All questions were answered.   80 minutes were spent on the date of the encounter in service to the patient including preparation, face-to-face consultation, discussion of test reports and available next steps, pedigree construction,  genetic risk assessment, documentation, and care coordination.    Thank you for sharing in the care of Laura [name] with us .  Please do not hesitate to contact us  at [phone] if you have any questions.   Lauraine Bodily, MS,  [facility] Certified Genetic Counselor   Genetic counseling student involved in appointment: No.

## 2024-08-02 ENCOUNTER — Encounter: Payer: Self-pay | Admitting: Advanced Practice Midwife

## 2024-08-02 ENCOUNTER — Telehealth: Payer: Self-pay | Admitting: Family Medicine

## 2024-08-02 ENCOUNTER — Telehealth: Payer: Self-pay | Admitting: Advanced Practice Midwife

## 2024-08-02 NOTE — Telephone Encounter (Signed)
 Returned pt phone call from earlier today.  Pt with questions/problems with FMLA.  See previous notes.  Pt job is not allowing her to work due to pregnancy and will not allow pt to wear respirator.  Dr Izell and I disagree, and approve wearing of a respirator in pregnancy so pt can continue to work.  If pt job will not allow pt to work, I will sign appropriate FMLA paperwork so pt will not lose her job, but this is not the recommendation from our office.    I left a message at this time and will follow up, or have RN follow up during office hours tomorrow.

## 2024-08-02 NOTE — Telephone Encounter (Signed)
 Patient needs a call back asap in regards to her FMLA. The patient centering Provider wrote her a note that says that she should be taken out of work for the rest of her pregnancy. She then turned in paperwork to us  to be filled out for her job. She says that another provider in her centering group told her that pregnancy is not a disability and that we could not take her out of work after [employer] already told her she could. The patient says the reason she is needing to be taken out of work is because of the environment that she works in. She is wanting to speak to someone who works with [employer] so she can better explain the situation so this can be resolved before she looses her job.

## 2024-08-03 ENCOUNTER — Encounter: Payer: Self-pay | Admitting: Advanced Practice Midwife

## 2024-08-04 ENCOUNTER — Ambulatory Visit: Payer: Self-pay

## 2024-08-05 NOTE — Telephone Encounter (Signed)
 CNM called pt.  See other encounter.

## 2024-08-11 ENCOUNTER — Telehealth: Payer: Self-pay

## 2024-08-11 NOTE — Telephone Encounter (Signed)
 I called the patient to discuss her partner's carrier screening results Laura [name] DOB 01/13/1994). He was not found to be a carrier for beta-hemoglobinopathies or spinal muscular atrophy. Please see report for details. A negative result on carrier screening reduces but does not eliminate the chance of being a carrier. The chance this couple's current and future pregnancies would be affected with these conditions is very low.  Lauraine Bodily, MS, [employer] Certified Genetic Counselor [facility] [phone]

## 2024-08-14 ENCOUNTER — Encounter (HOSPITAL_COMMUNITY): Payer: Self-pay | Admitting: Obstetrics & Gynecology

## 2024-08-14 ENCOUNTER — Inpatient Hospital Stay (HOSPITAL_COMMUNITY)
Admission: AD | Admit: 2024-08-14 | Discharge: 2024-08-14 | Disposition: A | Discharge status: Home / Self Care | Attending: Obstetrics & Gynecology | Admitting: Obstetrics & Gynecology

## 2024-08-14 DIAGNOSIS — K5900 Constipation, unspecified: Secondary | ICD-10-CM | POA: Insufficient documentation

## 2024-08-14 DIAGNOSIS — Z3A21 21 weeks gestation of pregnancy: Secondary | ICD-10-CM

## 2024-08-14 DIAGNOSIS — O26892 Other specified pregnancy related conditions, second trimester: Secondary | ICD-10-CM | POA: Diagnosis not present

## 2024-08-14 DIAGNOSIS — O99612 Diseases of the digestive system complicating pregnancy, second trimester: Secondary | ICD-10-CM | POA: Diagnosis not present

## 2024-08-14 DIAGNOSIS — R519 Headache, unspecified: Secondary | ICD-10-CM

## 2024-08-14 DIAGNOSIS — R1011 Right upper quadrant pain: Secondary | ICD-10-CM | POA: Diagnosis present

## 2024-08-14 LAB — URINALYSIS, ROUTINE W REFLEX MICROSCOPIC
Bilirubin Urine: NEGATIVE
Glucose, UA: NEGATIVE mg/dL
Hgb urine dipstick: NEGATIVE
Ketones, ur: NEGATIVE mg/dL
Nitrite: NEGATIVE
Protein, ur: NEGATIVE mg/dL
Specific Gravity, Urine: 1.011 (ref 1.005–1.030)
pH: 6 (ref 5.0–8.0)

## 2024-08-14 MED ORDER — ACETAMINOPHEN-CAFFEINE 500-65 MG PO TABS
2.0000 | ORAL_TABLET | ORAL | Status: AC
  Administered 2024-08-14: 2 via ORAL
  Filled 2024-08-14: qty 2

## 2024-08-14 MED ORDER — DIPHENHYDRAMINE HCL 25 MG PO TABS: 25.0000 mg | ORAL_TABLET | Freq: Four times a day (QID) | ORAL | 0 refills | Status: AC | PRN

## 2024-08-14 NOTE — MAU Note (Addendum)
 Laura [name] is a 23 y.o. at [redacted]w[redacted]d here in MAU reporting: ongoing problem with HA.  Gets one like every other day. Doesn't go away with Tylenol .  Was given medication for it , but it isn't helping.  No bleeding, increased d/c( no odor or irritation.. Pain in RUQ.comes and goes Onset of complaint: last night Pain score: HA7  abd 4 Vitals:   08/14/24 1104  BP: (!) 125/91  Pulse: (!) 119  Resp: 18  Temp: 98.4 F (36.9 C)  SpO2: 100%     FHT:165 Lab orders placed from triage:  urine    Mentioned she is constipated when leaving triage.  Asking what she can take.  (Taking Dulcolax tabs)

## 2024-08-14 NOTE — MAU Provider Note (Signed)
 History     CSN: [id number]  Arrival date and time: 08/14/24 1051 First Provider Initiated Contact with Patient 08/14/2024 11:01 AM  Chief Complaint  Patient presents with   Headache    HPI Laura [name] is a 23 y.o. [id number] at [redacted]w[redacted]d, 12/19/2024, by Ultrasound, who presents to the Maternity Assessment Unit for HA since last night. Currently rated 7/10 and located behind R eye. She tried Tylenol , Flexeril , Reglan , and Benadryl  last night. She feels lightheaded with this pain.   She also notes constipation since becoming pregnant. Last BM was 3d ago. Has tried MiraLax  which gives me the runs, then I'm right back to being constipated. She is also taking an orange D medicine.   ROS (+) HA, eye pain, constipation, nausea, dizziness, photo- and phonosensitivity (-) vomiting, visual changes, facial pain  Medications Prior to Admission  Medication Sig Dispense Refill Last Dose/Taking   acetaminophen  (TYLENOL ) 500 MG tablet Take 2 tablets (1,000 mg total) by mouth every 6 (six) hours as needed for moderate pain (pain score 4-6) or mild pain (pain score 1-3).   08/13/2024   albuterol  (VENTOLIN  HFA) 108 (90 Base) MCG/ACT inhaler Inhale 2 puffs into the lungs every 6 (six) hours as needed for wheezing or shortness of breath.   Past Week   aspirin  EC 81 MG tablet Take 2 tablets (162 mg total) by mouth at bedtime. Start taking when you are [redacted] weeks pregnant for rest of pregnancy for prevention of preeclampsia 300 tablet 2 08/14/2024 Morning   budesonide-formoterol (SYMBICORT) 80-4.5 MCG/ACT inhaler Inhale 2 puffs into the lungs.   Past Month   busPIRone (BUSPAR) 15 MG tablet Take 15 mg by mouth 3 (three) times daily.   08/14/2024   cyclobenzaprine  (FLEXERIL ) 10 MG tablet Take 1 tablet (10 mg total) by mouth 2 (two) times daily as needed (headache, muscle spasms). 20 tablet 2 08/13/2024   diphenhydrAMINE  (BENADRYL ) 25 MG tablet Take 1-2 tablets (25-50 mg total) by mouth every 6 (six) hours as  needed (headache, nausea). 30 tablet 0 08/13/2024   escitalopram (LEXAPRO) 20 MG tablet Take 20 mg by mouth daily.   08/14/2024   metoCLOPramide  (REGLAN ) 10 MG tablet Take 1 tablet (10 mg total) by mouth every 6 (six) hours as needed for nausea. 30 tablet 3 08/13/2024   NIFEdipine  (PROCARDIA -XL/NIFEDICAL-XL) 30 MG 24 hr tablet Take 1 tablet (30 mg total) by mouth daily. 30 tablet 2 08/13/2024   omeprazole (PRILOSEC) 20 MG capsule Take 1 capsule (20 mg total) by mouth daily. 1 tablet a day 30 capsule 6 Past Week   ondansetron  (ZOFRAN ) 4 MG tablet Take 2 tablets (8 mg total) by mouth every 6 (six) hours as needed for nausea. 30 tablet 3 08/14/2024   prenatal vitamin w/FE, FA (PRENATAL 1 + 1) 27-1 MG TABS tablet Take 1 tablet by mouth daily at 12 noon. 30 tablet 0 08/14/2024   promethazine  (PHENERGAN ) 25 MG tablet Take 1 tablet (25 mg total) by mouth every 6 (six) hours. For baseline nausea control. 30 tablet 1 08/13/2024   albuterol  (PROVENTIL ) (2.5 MG/3ML) 0.083% nebulizer solution Take 3 mLs (2.5 mg total) by nebulization every 6 (six) hours as needed for wheezing or shortness of breath. 75 mL 12 More than a month    Past Medical History:  Diagnosis Date   Anemia    Anxiety    Asthma    Chlamydia    Depression    Genital herpes    Gonorrhea    Leukopenia 01/07/2021  Ovarian cyst    PID (acute pelvic inflammatory disease)    TOA (tubo-ovarian abscess) 12/28/2023   UTI (urinary tract infection)     Past Surgical History:  Procedure Laterality Date   OVARIAN CYST REMOVAL Left 2020   Allergies: No Known Allergies  ROS reviewed and pertinent positives and negatives as documented in HPI.    Physical Exam  BP (!) 125/91 (BP Location: Right Arm)   Pulse (!) 119   Temp 98.4 F (36.9 C) (Oral)   Resp 18   Ht 5' 3 (1.6 m)   Wt 66.7 kg   SpO2 100%   BMI 26.06 kg/m   Gen: alert, no acute distress CV: tachycardic rate Resp: nonlabored Abd: soft, tender in RUQ w/o guarding or  rebound, nondistended  Cervical Exam    Labs A/Positive/-- (08/28 1129)   Results for orders placed or performed during the hospital encounter of 08/14/24 (from the past 24 hours)  Urinalysis, Routine w reflex microscopic -Urine, Clean Catch     Status: Abnormal   Collection Time: 08/14/24 11:09 AM  Result Value Ref Range   Color, Urine YELLOW YELLOW   APPearance CLOUDY (A) CLEAR   Specific Gravity, Urine 1.011 1.005 - 1.030   pH 6.0 5.0 - 8.0   Glucose, UA NEGATIVE NEGATIVE mg/dL   Hgb urine dipstick NEGATIVE NEGATIVE   Bilirubin Urine NEGATIVE NEGATIVE   Ketones, ur NEGATIVE NEGATIVE mg/dL   Protein, ur NEGATIVE NEGATIVE mg/dL   Nitrite NEGATIVE NEGATIVE   Leukocytes,Ua LARGE (A) NEGATIVE   RBC / HPF 0-5 0 - 5 RBC/hpf   WBC, UA 11-20 0 - 5 WBC/hpf   Bacteria, UA MANY (A) NONE SEEN   Squamous Epithelial / HPF 21-50 0 - 5 /HPF   Mucus PRESENT    Hyaline Casts, UA PRESENT      Assessment and Plan  MDM Laura [name] is a 23 y.o. H5E6996 at [redacted]w[redacted]d, 12/19/2024, by Ultrasound, who presents to the MAU for HA. Ddx: tension vs cercogenic HA, migraine, temporal arteritis, dehydration, constipation, UTI.  Pt feeling improved improved after Excedrin tension. She has some nausea but has not vomited and declines antiemetics at this time. She is feeling well enough to be discharged. Discussed lifestyle measures for constipation as well as the fact that some of her current antiemetic regimen can contribute to constipation.    ICD-10-CM   1. [redacted] weeks gestation of pregnancy  Z3A.21 Discharge patient    2. Acute nonintractable headache, unspecified headache type  R51.9 Discharge patient    3. Constipation during pregnancy in second trimester  O99.612    K59.00        Results pending at the time of DC: Ucx Dispo: DC home in stable condition with return precautions discussed and included in AVS.    Barabara Maier, DO [facility] [employer]

## 2024-08-14 NOTE — Discharge Instructions (Addendum)
-   constipation is very common in pregnancy. Aim to drink 60oz of water per day. Moving your body helps move your gut! Try going for a walk; per day or for after meals. Eat plenty of foods with fiber or add a fiber supplement (psyllium fiber capsules, Metamucil, or Benefiber). Iron and anti-nausea medications can also be very constipating, so if you are taking either of these, be sure to increase your fiber intake! - Excedrin Tension: acetaminophen  500mg  + caffeine  65mg . Take 2 pills. Do not take with plain acetaminophen  (Tylenol ) since that medicine is already in the Excedrin combination pill

## 2024-08-15 LAB — CULTURE, OB URINE: Culture: NO GROWTH

## 2024-08-16 ENCOUNTER — Encounter: Payer: Self-pay | Admitting: Physical Therapy

## 2024-08-16 ENCOUNTER — Other Ambulatory Visit: Payer: Self-pay

## 2024-08-16 ENCOUNTER — Encounter: Payer: Self-pay | Admitting: Advanced Practice Midwife

## 2024-08-16 ENCOUNTER — Ambulatory Visit (INDEPENDENT_AMBULATORY_CARE_PROVIDER_SITE_OTHER): Payer: Self-pay | Admitting: Advanced Practice Midwife

## 2024-08-16 ENCOUNTER — Ambulatory Visit: Attending: Advanced Practice Midwife | Admitting: Physical Therapy

## 2024-08-16 VITALS — BP 128/87 | HR 109 | Wt 148.0 lb

## 2024-08-16 DIAGNOSIS — N393 Stress incontinence (female) (male): Secondary | ICD-10-CM

## 2024-08-16 DIAGNOSIS — O99891 Other specified diseases and conditions complicating pregnancy: Secondary | ICD-10-CM | POA: Diagnosis not present

## 2024-08-16 DIAGNOSIS — O99019 Anemia complicating pregnancy, unspecified trimester: Secondary | ICD-10-CM

## 2024-08-16 DIAGNOSIS — O99012 Anemia complicating pregnancy, second trimester: Secondary | ICD-10-CM | POA: Diagnosis not present

## 2024-08-16 DIAGNOSIS — M6281 Muscle weakness (generalized): Secondary | ICD-10-CM | POA: Insufficient documentation

## 2024-08-16 DIAGNOSIS — Z3A22 22 weeks gestation of pregnancy: Secondary | ICD-10-CM

## 2024-08-16 DIAGNOSIS — O0992 Supervision of high risk pregnancy, unspecified, second trimester: Secondary | ICD-10-CM

## 2024-08-16 DIAGNOSIS — M5459 Other low back pain: Secondary | ICD-10-CM | POA: Insufficient documentation

## 2024-08-16 DIAGNOSIS — O0990 Supervision of high risk pregnancy, unspecified, unspecified trimester: Secondary | ICD-10-CM

## 2024-08-16 MED ORDER — ACCRUFER 30 MG PO CAPS: 1.0000 | ORAL_CAPSULE | Freq: Every day | ORAL | 3 refills | Status: AC

## 2024-08-16 NOTE — Progress Notes (Addendum)
 PRENATAL VISIT NOTE  Subjective:  Laura [name] is a 23 y.o. [id number] at [redacted]w[redacted]d being seen today for ongoing prenatal care.  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  Patient reports no complaints.  Contractions: Not present. Vag. Bleeding: None.  Movement: Present. Denies leaking of fluid.   The following portions of the patient's history were reviewed and updated as appropriate: allergies, current medications, past family history, past medical history, past social history, past surgical history and problem list.   Objective:   Vitals:   08/16/24 0914  BP: 128/87  Pulse: (!) 109  Weight: 148 lb (67.1 kg)    Fetal Status:  Fetal Heart Rate (bpm): 150 Fundal Height: 22 cm Movement: Present    General: Alert, oriented and cooperative. Patient is in no acute distress.  Skin: Skin is warm and dry. No rash noted.   Cardiovascular: Normal heart rate noted  Respiratory: Normal respiratory effort, no problems with respiration noted  Abdomen: Soft, gravid, appropriate for gestational age.  Pain/Pressure: Present (pressure)     Pelvic: Cervical exam deferred        Extremities: Normal range of motion.  Edema: None  Mental Status: Normal mood and affect. Normal behavior. Normal judgment and thought content.      05/26/2024   10:40 AM 02/10/2024    5:22 PM  Depression screen PHQ 2/9  Decreased Interest 3 1  Down, Depressed, Hopeless 0 1  PHQ - 2 Score 3 2  Altered  sleeping 3 1  Tired, decreased energy 3 1  Change in appetite 1 0  Feeling bad or failure about yourself  0 0  Trouble concentrating 1 1  Moving slowly or fidgety/restless 0 0  Suicidal thoughts 0 0  PHQ-9 Score 11  5      Data saved with a previous flowsheet row definition        05/26/2024   10:40 AM 02/10/2024    5:22 PM  GAD 7 : Generalized Anxiety Score  Nervous, Anxious, on Edge 3 3  Control/stop worrying 3 3  Worry too much - different things 3 3  Trouble relaxing 3 3  Restless 3 2  Easily annoyed or irritable 3 3  Afraid - awful might happen 2 1  Total GAD 7 Score 20 18    Assessment and Plan:  Pregnancy: G4P3003 at [redacted]w[redacted]d 1. Supervision of high risk pregnancy, antepartum --Anticipatory guidance about next visits/weeks of pregnancy given.     Centering Pregnancy, Session#2: Reviewed rules for self-governance with group. Direct group to [employer].   Facilitated discussion today:  Nutrition, Pelvic Pain/Back pain, Nausea  Exercise to review how to give respect and to feel respected.   Activity-demonstrated exercises to alleviate pelvic/back pain and discussed management options including physical therapy.   Fundal height and FHR appropriate today unless noted otherwise in plan. Patient to continue group care.    2. Antepartum anemia (Primary) --Pt with constipation with oral iron, change to prescription iron.  - Ferric Maltol (ACCRUFER) 30 MG CAPS; Take 1 capsule (30 mg total) by mouth daily.  Dispense: 30 capsule; Refill: 3  3. [redacted] weeks gestation of pregnancy   4. Stress incontinence in pregnancy --Exercise done in Centering today may help --Refer to PT   Preterm labor symptoms and general obstetric precautions including but not limited to vaginal bleeding, contractions, leaking of fluid and fetal movement were reviewed in detail with the patient. Please refer to After Visit Summary for other counseling recommendations.   Return for Centering  Sessions as scheduled.  Future Appointments  Date Time Provider Department Center  08/23/2024  9:30 AM Sophie Burnard HERO, PT [facility] None  08/31/2024  2:45 PM Antonetta Hastings C, PT [facility] None  09/06/2024  8:45 AM Antonetta Hastings C, PT [facility] None  09/06/2024 10:15 AM [facility] PROVIDER 1 [facility]  09/06/2024 10:30 AM [facility] US2 [facility]  09/13/2024  9:00 AM CENTERING PROVIDER [facility]  09/13/2024 12:30 PM Antonetta Hastings C, PT [facility] None  09/20/2024 11:45 AM Sophie Burnard HERO, PT [facility] None  09/27/2024  9:00 AM CENTERING PROVIDER [facility]  09/27/2024 12:30 PM Antonetta Hastings C, PT [facility] None  10/04/2024  8:45 AM Antonetta Hastings C, PT [facility] None  10/11/2024  9:00 AM CENTERING PROVIDER [facility]  10/11/2024 11:45 AM Antonetta Hastings C, PT [facility] None  10/25/2024  9:00 AM CENTERING PROVIDER [facility]  11/08/2024  9:00 AM CENTERING PROVIDER [facility]  11/22/2024  9:00 AM CENTERING PROVIDER [facility]  11/29/2024  9:00 AM CENTERING PROVIDER [facility]  12/06/2024  9:00 AM CENTERING PROVIDER [facility]    Olam Boards, CNM

## 2024-08-16 NOTE — Therapy (Signed)
 OUTPATIENT PHYSICAL THERAPY THORACOLUMBAR EVALUATION   Patient Name: Laura [name] MRN: [id number] DOB:[birth year], 23 y.o., female Today's Date: 08/16/2024  END OF SESSION:  PT End of Session - 08/16/24 1110     Visit Number 1    Date for Recertification  12/06/24    Authorization Type 2 insurances: UHC/ [facility] Medicaid will submit for auth    PT Start Time 1105    PT Stop Time 1145    PT Time Calculation (min) 40 min    Activity Tolerance Patient tolerated treatment well          Past Medical History:  Diagnosis Date   Anemia    Anxiety    Asthma    Chlamydia    Depression    Genital herpes    Gonorrhea    Leukopenia 01/07/2021   Ovarian cyst    PID (acute pelvic inflammatory disease)    TOA (tubo-ovarian abscess) 12/28/2023   UTI (urinary tract infection)    Past Surgical History:  Procedure Laterality Date   OVARIAN CYST REMOVAL Left 2020   Patient Active Problem List   Diagnosis Date Noted   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 07/29/2024   Chronic hypertension affecting pregnancy 07/19/2024   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 06/03/2024   Anemia in pregnancy, first trimester 05/26/2024   Nausea and vomiting during pregnancy 05/26/2024   Supervision of high risk pregnancy, antepartum, second trimester 05/17/2024   Biological false positive RPR test 12/30/2023   Depressive disorder 03/15/2023   GAD (generalized anxiety disorder) 05/13/2022   PTSD (post-traumatic stress disorder) 05/13/2022   Asthma affecting pregnancy in second trimester 08/15/2019    PCP: [facility]  REFERRING PROVIDER: Milly Planas CNM  REFERRING DIAG: O99.891, M54.9 back pain affecting pregnancy in 2nd trimester  Rationale for Evaluation and Treatment: Rehabilitation  THERAPY DIAG:  Back pain during pregnancy ONSET DATE: 2019, worsening during current pregancy  SUBJECTIVE:                                                                                                                                                                                            SUBJECTIVE STATEMENT: Back pain since epidural from first pregnancy 2019.  Had a pinch in the low back since that time making it hard to bend down or with turning.  With this pregnancy feeling it more often; maternity belt is not comfortable (feels like it's smothering me)  Due date 3/23  High risk pregnancy: BP (monitoring for pre-eclampsia and pelvic pain) Has 3, 4 and 6 year olds at home   PERTINENT HISTORY:  Chronic HTN; asthma; generalized anxiety  disorder; PTSD  PAIN:   Are you having pain? 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 No   OCCUPATION: unable to work at [employer] secondary to unable to provide accomodations  PLOF: Independent  PATIENT GOALS: help with back pain (never has done PT)  OBJECTIVE:  Note: Objective measures were completed at Evaluation unless otherwise noted.   PATIENT SURVEYS:  Modified Oswestry:  MODIFIED OSWESTRY DISABILITY SCALE  Date: 11/18 Score  Pain intensity 3 =  Pain medication provides me with moderate relief from pain.  2. Personal care (washing, dressing, etc.) 1 =  I can take care of myself normally, but it increases my pain.  3. Lifting 4 = I can lift only very light weights  4. Walking 2 =  Pain prevents me from walking more than  mile.  5. Sitting 1 =  I can only sit in my favorite chair as long as I like.  6. Standing 2 =  Pain prevents me from standing more than 1 hour  7. Sleeping 1 = I  can sleep well only by using pain medication.  8. Social Life 2 = Pain prevents me from participating in more energetic activities (eg. sports, dancing).  9. Traveling 0 =  I can travel anywhere without increased pain.  10. Employment/ Homemaking 1 = My normal homemaking/job activities increase my pain, but I can still perform all that is required of me  Total 34%   Interpretation of scores: Score Category Description  0-20% Minimal Disability The patient can cope with most living activities. Usually no treatment is indicated apart from advice on lifting, sitting and exercise  21-40% Moderate Disability The patient experiences more pain and difficulty with sitting, lifting and standing. Travel and social life are more difficult and they may be disabled from work. Personal care, sexual activity and sleeping are not grossly affected, and the patient can usually be managed by conservative means  41-60% Severe Disability Pain remains the main problem in this group, but activities of daily living are affected. These patients require a detailed investigation  61-80% Crippled Back pain impinges on all aspects of the patient's life. Positive intervention is required  81-100% Bed-bound These patients are either bed-bound or exaggerating their symptoms  Bluford FORBES Zoe DELENA Karon DELENA, et al. Surgery versus conservative management of stable thoracolumbar fracture: the PRESTO feasibility RCT. [place] ([place]): [employer]; 2021 Nov. [place] [facility] [employer] Assessment, No. 25.62.) Appendix 3, Oswestry Disability Index category descriptors. Available from: Findjewelers.cz  Minimally Clinically Important Difference (MCID) = 12.8%  COGNITION: Overall cognitive status: Within functional limits for tasks assessed      PALPATION: Painful right SI joint line  LUMBAR ROM: pt avoids lumbar flexion, extension and side bending WFLs; decreased lumbofascial mobility with quadruped  cat/cow and childs pose   LOWER EXTREMITY ROM:   WFLs  LOWER EXTREMITY MMT:  decreased hip abduction strength bil 4/5  FUNCTIONAL TESTS:  Able to rise sit to stand without UE use  GAIT: Comments: WFLs  TREATMENT DATE: 08/16/24 evaluation    Childs pose 3x Quadruped cat/cow  5x Quadruped tail wags 5x KT  star pattern over lumbo sacral region: advised on removal in 2 days to check skin tolerance                                                                                                                            PATIENT EDUCATION:  Education details: Educated patient on anatomy and physiology of current symptoms, prognosis, plan of care as well as initial self care strategies to promote recovery Person educated: Patient Education method: Explanation Education comprehension: verbalized understanding  HOME EXERCISE PROGRAM: To be started  ASSESSMENT:  CLINICAL IMPRESSION: Patient is a 23 y.o. female who was seen today for physical therapy evaluation and treatment for back pain affecting pregnancy in 2nd trimester. She is at high risk for high blood pressure and pre-eclampsia.  She reports her pain is central and right low back and has been present since the epidural during child birth in 2019.  Symptoms are present with bending over and turning the wrong way.  Pelvic pressure with walking.  She would benefit from PT for therapeutic exercise, patient education and manual techniques for pain relief during pregnancy and to improve function in taking care of her 3 children.  OBJECTIVE IMPAIRMENTS: decreased activity tolerance, decreased mobility, difficulty walking, decreased strength, impaired perceived functional ability, and pain.   ACTIVITY LIMITATIONS: carrying, lifting, bending, standing, squatting, locomotion level, and caring for others  PARTICIPATION LIMITATIONS: meal prep, cleaning, laundry, interpersonal relationship, and occupation  PERSONAL FACTORS: Time since onset  of injury/illness/exacerbation and 3+ comorbidities: HTN during pregnancy, asthma, anxiety are also affecting patient's functional outcome.   REHAB POTENTIAL: Good  CLINICAL DECISION MAKING: Evolving/moderate complexity  EVALUATION COMPLEXITY: Moderate   GOALS: Goals reviewed with patient? Yes  SHORT TERM GOALS: Target date: 09/27/2024    The patient will demonstrate knowledge of basic self care strategies and exercises  Baseline: Goal status: INITIAL  2.  The patient will have trunk and LE strength and mobility needed to bend forward needed to take care of her young children Baseline:  Goal status: INITIAL  3.  The patient will report a 30% improvement in pain levels with functional activities which are currently difficult including bending and turning Baseline:  Goal status: INITIAL    LONG TERM GOALS: Target date: 12/06/2024    The patient will be independent in a safe self progression of a home exercise program to promote further recovery of function  Baseline:  Goal status: INITIAL  2.  The patient will report a 60% improvement in pain levels with functional activities which are currently difficult including bending and turning Baseline:  Goal status: INITIAL  3.  The patient will have improved hip strength to at least 4+/5 needed for standing, walking longer distances  Baseline:  Goal status: INITIAL  4.  The patient will have improved trunk flexor and extensor muscle strength needed for lifting her newborn  Baseline:  Goal status: INITIAL  5.  Modified  Oswestry functional outcome measure score improved to   26  % indicating improved function with ADLS with less pain.   Baseline:  Goal status: INITIAL    PLAN:  PT FREQUENCY: 1-2x/week  PT DURATION: 16 weeks    PLANNED INTERVENTIONS: 97164- PT Re-evaluation, 97750- Physical Performance Testing, 97110-Therapeutic exercises, 97530- Therapeutic activity, [id number]- Neuromuscular re-education, 97535- Self  Care, 02859- Manual therapy, [phone]- Aquatic Therapy, Patient/Family education, Taping, Joint mobilization, Cryotherapy, and Moist heat.  PLAN FOR NEXT SESSION: assess response to KT tape; pelvic mobility ex's; try seated ball ex's; low level core; monitor BP and symptom response  Glade Pesa, PT 08/16/24 6:10 PM Phone: [phone] Fax: [phone]

## 2024-08-18 ENCOUNTER — Ambulatory Visit

## 2024-08-18 DIAGNOSIS — M5459 Other low back pain: Secondary | ICD-10-CM

## 2024-08-18 DIAGNOSIS — M6281 Muscle weakness (generalized): Secondary | ICD-10-CM

## 2024-08-18 NOTE — Patient Instructions (Signed)
Urge Incontinence  Ideal urination frequency is every 2-4 wakeful hours, which equates to 5-8 times within a 24-hour period.   Urge incontinence is leakage that occurs when the bladder muscle contracts, creating a sudden need to go before getting to the bathroom.   Going too often when your bladder isn't actually full can disrupt the body's automatic signals to store and hold urine longer, which will increase urgency/frequency.  In this case, the bladder "is running the show" and strategies can be learned to retrain this pattern.   One should be able to control the first urge to urinate, at around 150mL.  The bladder can hold up to a "grande latte," or 400mL. To help you gain control, practice the Urge Drill below when urgency strikes.  This drill will help retrain your bladder signals and allow you to store and hold urine longer.  The overall goal is to stretch out your time between voids to reach a more manageable voiding schedule.    Practice your "quick flicks" often throughout the day (each waking hour) even when you don't need feel the urge to go.  This will help strengthen your pelvic floor muscles, making them more effective in controlling leakage.  Urge Drill  When you feel an urge to go, follow these steps to regain control: Stop what you are doing and be still Take one deep breath, directing your air into your abdomen Think an affirming thought, such as "I've got this." Do 5 quick flicks of your pelvic floor Walk with control to the bathroom to void, or delay voiding        The knack: Use this technique while coughing, laughing, sneezing, or with any activities that causes you to leak urine a little. Right before you perform one of these activities that increase pressure in the abdomen and pushes a little urine out, perform a pelvic floor muscle contraction and hold. If that does not completely stop the leaking, try tightening your thighs together in addition to performing a  pelvic floor muscle contraction. Make sure you are not trying to stifle a cough, sneeze, or laugh; allow these activities in full as it will cause less pressure down into the bladder and pelvic floor muscles.       [facility] [address], [address] [place], [place] [address] Phone # [phone] Fax [phone]  

## 2024-08-18 NOTE — Therapy (Addendum)
 OUTPATIENT PHYSICAL THERAPY THORACOLUMBAR TREATMENT   Patient Name: Laura [name] MRN: [id number] DOB:[birth year], 23 y.o., female Today's Date: 08/18/2024  END OF SESSION:  PT End of Session - 08/18/24 0932     Visit Number 2    Date for Recertification  12/06/24    Authorization Type 2 insurances: [employer]/ [facility] Medicaid will submit for auth    PT Start Time 9068    PT Stop Time 1010    PT Time Calculation (min) 39 min    Activity Tolerance Patient tolerated treatment well    Behavior During Therapy [facility] for tasks assessed/performed          Past Medical History:  Diagnosis Date   Anemia    Anxiety    Asthma    Chlamydia    Depression    Genital herpes    Gonorrhea    Leukopenia 01/07/2021   Ovarian cyst    PID (acute pelvic inflammatory disease)    TOA (tubo-ovarian abscess) 12/28/2023   UTI (urinary tract infection)    Past Surgical History:  Procedure Laterality Date   OVARIAN CYST REMOVAL Left 2020   Patient Active Problem List   Diagnosis Date Noted   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 07/29/2024   Chronic hypertension affecting pregnancy 07/19/2024   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 06/03/2024   Anemia in pregnancy, first trimester 05/26/2024   Nausea and vomiting during pregnancy 05/26/2024   Supervision of high risk pregnancy, antepartum, second trimester 05/17/2024   Biological false positive RPR test 12/30/2023   Depressive disorder 03/15/2023   GAD (generalized anxiety disorder) 05/13/2022   PTSD (post-traumatic stress disorder) 05/13/2022   Asthma affecting pregnancy in second trimester 08/15/2019    PCP: [facility]  REFERRING PROVIDER: Milly Planas CNM  REFERRING DIAG: O99.891, M54.9 back pain affecting pregnancy in 2nd trimester  Rationale for Evaluation and Treatment: Rehabilitation  THERAPY DIAG:  Back pain during pregnancy ONSET DATE: 2019, worsening during current pregancy  SUBJECTIVE:                                                                                                                                                                                            SUBJECTIVE STATEMENT: Pt states that she cannot hold her urine at all. If she gets an urge and if she does not go that first minute she will have complete void. Once she starts leaking, she cannot stop it and she will be soaked. She goes through 2-3 pads a day.   Due date 3/23  High risk pregnancy: BP (monitoring for pre-eclampsia and pelvic pain) Has 3, 4 and 6  year olds at home   PERTINENT HISTORY:  Chronic HTN; asthma; generalized anxiety disorder; PTSD  PAIN: 08/18/24  Are you having pain? 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 No   OCCUPATION: unable to work at [employer] secondary to unable to provide accomodations  PLOF: Independent  PATIENT GOALS: help with back pain (never has done PT)  OBJECTIVE:  Note: Objective measures were completed at Evaluation unless otherwise noted. 08/18/24: UIQ-7: 81               External Perineal Exam: gaping with visibly anterior/posterior vaginal walls present                             Internal Pelvic Floor: no tenderness  Patient confirms identification and approves PT to assess internal pelvic floor and treatment Yes All internal or external pelvic floor assessments and/or treatments are completed with proper hand hygiene and gloves hands. If needed gloves are changed with hand hygiene during patient care time.  PELVIC MMT:   MMT eval   Vaginal 1/5, 2 second hold, 6 repeat contractions  (Blank rows = not tested)        TONE: WNL  PROLAPSE: Grade 2 anterior/posterior vaginal wall laxity    EVAL: PATIENT SURVEYS:      Modified Oswestry:  MODIFIED OSWESTRY DISABILITY SCALE  Date: 11/18 Score  Pain intensity 3 =  Pain medication provides me with moderate relief from pain.  2. Personal care (washing, dressing, etc.) 1 =  I can take care of myself normally, but it increases my pain.  3. Lifting 4 = I can lift only very light weights  4. Walking 2 =  Pain prevents me from walking more than  mile.  5. Sitting 1 =  I can only sit in my favorite chair as long as I like.  6. Standing 2 =  Pain prevents me from standing more than 1 hour  7. Sleeping 1 = I can sleep well only by using pain medication.  8. Social Life 2 = Pain prevents me from participating in more energetic activities (eg. sports, dancing).  9. Traveling 0 =  I can travel anywhere without increased pain.  10. Employment/ Homemaking 1 = My normal homemaking/job activities increase my pain, but I can still perform all that is required of me  Total 34%   Interpretation of scores: Score Category Description  0-20% Minimal Disability The patient can cope with most living activities. Usually no treatment is indicated apart from advice on lifting, sitting and exercise  21-40% Moderate Disability The patient experiences more pain and difficulty with sitting, lifting and standing. Travel and social life are more difficult and they may be disabled from work. Personal care, sexual activity and sleeping are not grossly affected, and the patient can usually be managed by conservative means  41-60% Severe Disability Pain remains the main problem in this group, but activities of daily living are affected. These patients require a detailed investigation  61-80% Crippled Back pain impinges on all aspects  of the patient's life. Positive intervention is required  81-100%  Bed-bound These patients are either bed-bound or exaggerating their symptoms  Bluford FORBES Zoe DELENA Karon DELENA, et al. Surgery versus conservative management of stable thoracolumbar fracture: the PRESTO feasibility RCT. [place] ([place]): [employer]; 2021 Nov. [facility] Technology Assessment, No. 25.62.) Appendix 3, Oswestry Disability Index category descriptors. Available from: Findjewelers.cz  Minimally Clinically Important Difference (MCID) = 12.8%  COGNITION: Overall cognitive status: Within functional limits for tasks assessed      PALPATION: Painful right SI joint line  LUMBAR ROM: pt avoids lumbar flexion, extension and side bending WFLs; decreased lumbofascial mobility with quadruped cat/cow and childs pose   LOWER EXTREMITY ROM:   WFLs  LOWER EXTREMITY MMT:  decreased hip abduction strength bil 4/5  FUNCTIONAL TESTS:  Able to rise sit to stand without UE use  GAIT: Comments: WFLs  TREATMENT DATE:   08/18/24 Manual: Pt provides verbal consent for internal vaginal/rectal pelvic floor exam. Internal vaginal pelvic floor muscle assessment Neuromuscular re-education: Transversus abdominus training with multimodal cues for improved motor control and breath coordination Internal vaginal pelvic floor muscle contraction training Quick flicks 10x Long holds 2x10 seconds Urge dirll 2x The knack 1x Seated hip adduction ball press with transversus abdominus and pelvic floor muscle 2 x 10 Seated hip abduction red band with transversus abdominus and pelvic floor muscle 2 x 10 Cat cow 10x Child's pose 10 breaths Therapeutic activities: Squatty potty/toilet position Exhale with pushing Talking to midwife about magnesium oxide and psyllium husk   08/16/24 evaluation    Childs pose 3x Quadruped cat/cow 5x Quadruped tail wags 5x KT  star pattern over lumbo sacral region: advised on removal in 2 days to check skin tolerance                                                                                                                             PATIENT EDUCATION:  Education details: Educated patient on anatomy and physiology of current symptoms, prognosis, plan of care as well as initial self care strategies to promote recovery Person educated: Patient Education method: Explanation Education comprehension: verbalized understanding  HOME EXERCISE PROGRAM: [id number]  ASSESSMENT:  CLINICAL IMPRESSION: Patient is a 23 y.o. female who was seen today for physical therapy treatment for back pain affecting pregnancy in 2nd trimester. She is here for pelvic floor physical therapy today. We performed internal vaginal pelvic floor muscle assessment, finding anterior/posterior vaginal wall laxity, significant weakness, and decreased endurance of pelvic floor muscles. She did well with contraction training and was able to start transversus abdominus training while incorporating pelvic floor muscles and breathing. We discussed breathing and pressure management at length due to vaginal wall laxity and pelvic pressure. Due to this, we also went over constipation, which she has long term problems with. She was encouraged to talk with midwife about fiber and magnesium oxide to see if this will help with constipation. We also went over squatty  potty, correct toilet position, and breathing with bowel movement. She would benefit from PT for therapeutic exercise, patient education and manual techniques for pain relief during pregnancy and to improve function in taking care of her 3 children.  OBJECTIVE IMPAIRMENTS: decreased activity tolerance, decreased mobility, difficulty walking, decreased strength, impaired perceived functional ability, and pain.   ACTIVITY LIMITATIONS: carrying, lifting, bending, standing, squatting, locomotion level, and caring for others  PARTICIPATION LIMITATIONS: meal prep, cleaning, laundry, interpersonal relationship, and  occupation  PERSONAL FACTORS: Time since onset of injury/illness/exacerbation and 3+ comorbidities: HTN during pregnancy, asthma, anxiety are also affecting patient's functional outcome.   REHAB POTENTIAL: Good  CLINICAL DECISION MAKING: Evolving/moderate complexity  EVALUATION COMPLEXITY: Moderate   GOALS: Goals reviewed with patient? Yes  SHORT TERM GOALS: Target date: 09/27/2024    The patient will demonstrate knowledge of basic self care strategies and exercises  Baseline: Goal status: INITIAL  2.  The patient will have trunk and LE strength and mobility needed to bend forward needed to take care of her young children Baseline:  Goal status: INITIAL  3.  The patient will report a 30% improvement in pain levels with functional activities which are currently difficult including bending and turning Baseline:  Goal status: INITIAL    LONG TERM GOALS: Target date: 12/06/2024    The patient will be independent in a safe self progression of a home exercise program to promote further recovery of function  Baseline:  Goal status: INITIAL  2.  The patient will report a 60% improvement in pain levels with functional activities which are currently difficult including bending and turning Baseline:  Goal status: INITIAL  3.  The patient will have improved hip strength to at least 4+/5 needed for standing, walking longer distances  Baseline:  Goal status: INITIAL  4.  The patient will have improved trunk flexor and extensor muscle strength needed for lifting her newborn  Baseline:  Goal status: INITIAL  5.  Modified Oswestry functional outcome measure score improved to   26  % indicating improved function with ADLS with less pain.   Baseline:  Goal status: INITIAL  6. Pt will improve UIQ-7 score to less than 50 in order to demonstrate improved activity tolerance and less impact of bladder control on functional activities.   Baseline: 81  Goal Status: INITIAL  7. Pt will  demonstrate normal pelvic floor muscle tone and A/ROM, able to achieve 3/5 strength with contractions and 10 sec endurance, in order to reduce urinary leaking and number of pads patient wears.   Baseline:  Goal Status: INITIAL  8. Pt will reduce pad use to no more than 1 a day in order to decrease monetary impact and risk of infection.   Baseline: 3 a day  Goal status: INITIAL  9. Pt will be able to go 2-3 hours in between voids without urgency or incontinence in order to improve QOL and perform all functional activities with less difficulty.   Baseline: 3-4x/hour  Goal Status: INITIAL  10. Pt will decrease frequency of nightly trips to the bathroom to 1 or less in order to get restful sleep.   Baseline: every 2 hours  Goal status: INITIAL  PLAN:  PT FREQUENCY: 1-2x/week  PT DURATION: 16 weeks    PLANNED INTERVENTIONS: 97164- PT Re-evaluation, 97750- Physical Performance Testing, 97110-Therapeutic exercises, 97530- Therapeutic activity, [id number]- Neuromuscular re-education, 97535- Self Care, 02859- Manual therapy, [id number]- Aquatic Therapy, Patient/Family education, Taping, Joint mobilization, Cryotherapy, and Moist heat.  PLAN FOR NEXT SESSION:  assess response to KT tape; pelvic mobility ex's; try seated ball ex's; low level core; monitor BP and symptom response  Pelvic floor: splinting techniques to help with constipation; revisit what midwife says about fiber/magnesium; core training with use of small and medium stability ball, quadruped positions; go over pelvic stability belt for support and pain reduction   Josette Mares, PT, DPT11/20/2510:14 AM [facility] [address], [address] [place], [place] [address] Phone # [phone] Fax [phone]

## 2024-08-22 ENCOUNTER — Telehealth: Payer: Self-pay | Admitting: Family Medicine

## 2024-08-22 NOTE — Telephone Encounter (Signed)
 I received a call from someone from [address] she says that she is needing to schedule a peer to peer conversation with Dr.Pickens in regards to this patients Disability Claim. Call back number is [phone] reference # [id number].

## 2024-08-23 ENCOUNTER — Ambulatory Visit

## 2024-08-23 DIAGNOSIS — M5459 Other low back pain: Secondary | ICD-10-CM

## 2024-08-23 DIAGNOSIS — M6281 Muscle weakness (generalized): Secondary | ICD-10-CM

## 2024-08-23 NOTE — Therapy (Signed)
 OUTPATIENT PHYSICAL THERAPY THORACOLUMBAR TREATMENT   Patient Name: Laura [name] MRN: [id number] DOB:[birth year], 23 y.o., female Today's Date: 08/23/2024  END OF SESSION:  PT End of Session - 08/23/24 1015     Visit Number 3    Date for Recertification  12/06/24    Authorization Type 2 insurances: [employer]HC/ [facility] Medicaid 16 visits 11/18-3/10    Authorization - Visit Number 2    Authorization - Number of Visits 16    PT Start Time 0933    PT Stop Time 1014    PT Time Calculation (min) 41 min    Activity Tolerance Patient tolerated treatment well    Behavior During Therapy [facility] for tasks assessed/performed           Past Medical History:  Diagnosis Date   Anemia    Anxiety    Asthma    Chlamydia    Depression    Genital herpes    Gonorrhea    Leukopenia 01/07/2021   Ovarian cyst    PID (acute pelvic inflammatory disease)    TOA (tubo-ovarian abscess) 12/28/2023   UTI (urinary tract infection)    Past Surgical History:  Procedure Laterality Date   OVARIAN CYST REMOVAL Left 2020   Patient Active Problem List   Diagnosis Date Noted   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 07/29/2024   Chronic hypertension affecting pregnancy 07/19/2024   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 06/03/2024   Anemia in pregnancy, first trimester 05/26/2024   Nausea and vomiting during pregnancy 05/26/2024   Supervision of high risk pregnancy, antepartum, second trimester 05/17/2024   Biological false positive RPR test 12/30/2023   Depressive disorder 03/15/2023   GAD (generalized anxiety disorder) 05/13/2022   PTSD (post-traumatic stress disorder) 05/13/2022   Asthma affecting pregnancy in second trimester 08/15/2019    PCP: [facility]  REFERRING PROVIDER: Milly Planas CNM  REFERRING DIAG: O99.891, M54.9 back pain affecting pregnancy in 2nd trimester  Rationale for Evaluation and Treatment: Rehabilitation  THERAPY DIAG:  Back pain during  pregnancy ONSET DATE: 2019, worsening during current pregancy  SUBJECTIVE:                                                                                                                                                                                           SUBJECTIVE STATEMENT: I did a lot of sitting doing my daughter's hair this weekend.  My back is really hurting.   Due date 3/23  High risk pregnancy: BP (monitoring for pre-eclampsia and pelvic pain) Has 3, 4 and 6 year olds at home   PERTINENT HISTORY:  Chronic HTN; asthma; generalized anxiety  disorder; PTSD  PAIN: 08/18/24  Are you having pain? 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 No   OCCUPATION: unable to work at [employer] secondary to unable to provide accomodations  PLOF: Independent  PATIENT GOALS: help with back pain (never has done PT)  OBJECTIVE:  Note: Objective measures were completed at Evaluation unless otherwise noted. 08/18/24: UIQ-7: 81               External Perineal Exam: gaping with visibly anterior/posterior vaginal walls present                             Internal Pelvic Floor: no tenderness  Patient confirms identification and approves PT to assess internal pelvic floor and treatment Yes All internal or external pelvic floor assessments and/or treatments are completed with proper hand hygiene and gloves hands. If needed gloves are changed with hand hygiene during patient care time.  PELVIC MMT:   MMT eval  Vaginal 1/5, 2 second hold, 6 repeat contractions  (Blank rows =  not tested)        TONE: WNL  PROLAPSE: Grade 2 anterior/posterior vaginal wall laxity    EVAL: PATIENT SURVEYS:      Modified Oswestry:  MODIFIED OSWESTRY DISABILITY SCALE  Date: 11/18 Score  Pain intensity 3 =  Pain medication provides me with moderate relief from pain.  2. Personal care (washing, dressing, etc.) 1 =  I can take care of myself normally, but it increases my pain.  3. Lifting 4 = I can lift only very light weights  4. Walking 2 =  Pain prevents me from walking more than  mile.  5. Sitting 1 =  I can only sit in my favorite chair as long as I like.  6. Standing 2 =  Pain prevents me from standing more than 1 hour  7. Sleeping 1 = I can sleep well only by using pain medication.  8. Social Life 2 = Pain prevents me from participating in more energetic activities (eg. sports, dancing).  9. Traveling 0 =  I can travel anywhere without increased pain.  10. Employment/ Homemaking 1 = My normal homemaking/job activities increase my pain, but I can still perform all that is required of me  Total 34%   Interpretation of scores: Score Category Description  0-20% Minimal Disability The patient can cope with most living activities. Usually no treatment is indicated apart from advice on lifting, sitting and exercise  21-40% Moderate Disability The patient experiences more pain and difficulty with sitting, lifting and standing. Travel and social life are more difficult and they may be disabled from work. Personal care, sexual activity and sleeping are not grossly affected, and the patient can usually be managed by conservative means  41-60% Severe Disability Pain remains the main problem in this group, but activities of daily living are affected. These patients require a detailed investigation  61-80% Crippled Back pain impinges on all aspects of the patient's life. Positive intervention is required  81-100% Bed-bound These patients are  either bed-bound or exaggerating their  symptoms  Bluford FORBES Zoe DELENA Karon DELENA, et al. Surgery versus conservative management of stable thoracolumbar fracture: the PRESTO feasibility RCT. [place] ([place]): [employer]; 2021 Nov. [facility] Technology Assessment, No. 25.62.) Appendix 3, Oswestry Disability Index category descriptors. Available from: Findjewelers.cz  Minimally Clinically Important Difference (MCID) = 12.8%  COGNITION: Overall cognitive status: Within functional limits for tasks assessed      PALPATION: Painful right SI joint line  LUMBAR ROM: pt avoids lumbar flexion, extension and side bending WFLs; decreased lumbofascial mobility with quadruped cat/cow and childs pose   LOWER EXTREMITY ROM:   WFLs  LOWER EXTREMITY MMT:  decreased hip abduction strength bil 4/5  FUNCTIONAL TESTS:  Able to rise sit to stand without UE use  GAIT: Comments: WFLs  TREATMENT DATE:   08/23/24: NuStep: level 1x5 minutes- PT present to discuss progress  Ball roll outs: 3 ways: 5 hold x 5 each direction Open book x10 bil Propped on wedge: Low trunk rotation 3x20 seconds, ball squeeze with TA activation and breathe out on activation 5x10, hip abduction with yellow loop 2x10 Seated on ball: pelvic rocking, circles bil  Manual: addaday to Rt>Lt lumbar paraspinals with pt in Rt sidelying.  PT monitored for response.   08/18/24 Manual: Pt provides verbal consent for internal vaginal/rectal pelvic floor exam. Internal vaginal pelvic floor muscle assessment Neuromuscular re-education: Transversus abdominus training with multimodal cues for improved motor control and breath coordination Internal vaginal pelvic floor muscle contraction training Quick flicks 10x Long holds 2x10 seconds Urge dirll 2x The knack 1x Seated hip adduction ball press with transversus abdominus and pelvic floor muscle 2 x 10 Seated hip abduction red band with transversus abdominus and pelvic floor muscle 2 x  10 Cat cow 10x Child's pose 10 breaths Therapeutic activities: Squatty potty/toilet position Exhale with pushing Talking to midwife about magnesium oxide and psyllium husk   08/16/24 evaluation    Childs pose 3x Quadruped cat/cow 5x Quadruped tail wags 5x KT  star pattern over lumbo sacral region: advised on removal in 2 days to check skin tolerance                                                                                                                            PATIENT EDUCATION:  Education details: Educated patient on anatomy and physiology of current symptoms, prognosis, plan of care as well as initial self care strategies to promote recovery Person educated: Patient Education method: Explanation Education comprehension: verbalized understanding  HOME EXERCISE PROGRAM: [id number]  ASSESSMENT:  CLINICAL IMPRESSION: Pt arrived with increased LBP after braiding her 2 daughter's hair over the past 2 days with extended periods of sitting.  Pt tolerated flexibility exercises and gentle strength and reported reduced LBP at the end of session.  She responded well to lumbar tissue mobilization. She would benefit from PT for therapeutic exercise, patient education and manual techniques for pain relief during pregnancy and to improve  function in taking care of her 3 children.  OBJECTIVE IMPAIRMENTS: decreased activity tolerance, decreased mobility, difficulty walking, decreased strength, impaired perceived functional ability, and pain.   ACTIVITY LIMITATIONS: carrying, lifting, bending, standing, squatting, locomotion level, and caring for others  PARTICIPATION LIMITATIONS: meal prep, cleaning, laundry, interpersonal relationship, and occupation  PERSONAL FACTORS: Time since onset of injury/illness/exacerbation and 3+ comorbidities: HTN during pregnancy, asthma, anxiety are also affecting patient's functional outcome.   REHAB POTENTIAL: Good  CLINICAL DECISION MAKING:  Evolving/moderate complexity  EVALUATION COMPLEXITY: Moderate   GOALS: Goals reviewed with patient? Yes  SHORT TERM GOALS: Target date: 09/27/2024    The patient will demonstrate knowledge of basic self care strategies and exercises  Baseline: Goal status: in progress  2.  The patient will have trunk and LE strength and mobility needed to bend forward needed to take care of her young children Baseline:  Goal status: INITIAL  3.  The patient will report a 30% improvement in pain levels with functional activities which are currently difficult including bending and turning Baseline:  Goal status: INITIAL    LONG TERM GOALS: Target date: 12/06/2024    The patient will be independent in a safe self progression of a home exercise program to promote further recovery of function  Baseline:  Goal status: INITIAL  2.  The patient will report a 60% improvement in pain levels with functional activities which are currently difficult including bending and turning Baseline:  Goal status: INITIAL  3.  The patient will have improved hip strength to at least 4+/5 needed for standing, walking longer distances  Baseline:  Goal status: INITIAL  4.  The patient will have improved trunk flexor and extensor muscle strength needed for lifting her newborn  Baseline:  Goal status: INITIAL  5.  Modified Oswestry functional outcome measure score improved to   26  % indicating improved function with ADLS with less pain.   Baseline:  Goal status: INITIAL  6. Pt will improve UIQ-7 score to less than 50 in order to demonstrate improved activity tolerance and less impact of bladder control on functional activities.   Baseline: 81  Goal Status: INITIAL  7. Pt will demonstrate normal pelvic floor muscle tone and A/ROM, able to achieve 3/5 strength with contractions and 10 sec endurance, in order to reduce urinary leaking and number of pads patient wears.   Baseline:  Goal Status: INITIAL  8. Pt  will reduce pad use to no more than 1 a day in order to decrease monetary impact and risk of infection.   Baseline: 3 a day  Goal status: INITIAL  9. Pt will be able to go 2-3 hours in between voids without urgency or incontinence in order to improve QOL and perform all functional activities with less difficulty.   Baseline: 3-4x/hour  Goal Status: INITIAL  10. Pt will decrease frequency of nightly trips to the bathroom to 1 or less in order to get restful sleep.   Baseline: every 2 hours  Goal status: INITIAL  PLAN:  PT FREQUENCY: 1-2x/week  PT DURATION: 16 weeks    PLANNED INTERVENTIONS: 97164- PT Re-evaluation, 97750- Physical Performance Testing, 97110-Therapeutic exercises, 97530- Therapeutic activity, [id number]- Neuromuscular re-education, 97535- Self Care, [id number]- Manual therapy, [phone]- Aquatic Therapy, Patient/Family education, Taping, Joint mobilization, Cryotherapy, and Moist heat.  PLAN FOR NEXT SESSION: assess response to KT tape; pelvic mobility ex's; try seated ball ex's; low level core; monitor BP and symptom response  Pelvic floor: splinting techniques to help with constipation;  revisit what midwife says about fiber/magnesium; core training with use of small and medium stability ball, quadruped positions; go over pelvic stability belt for support and pain reduction   Burnard Joy, PT 08/23/24 10:16 AM  [facility] [address], [address] [place], [place] [address] Phone # [phone] Fax [phone]

## 2024-08-24 ENCOUNTER — Telehealth: Payer: Self-pay | Admitting: Obstetrics and Gynecology

## 2024-08-24 NOTE — Telephone Encounter (Signed)
 OB Telephone Note I received a message to call [phone], which I did they are going to make an appt for them to call me b/w 12 to 1 today  Bebe Izell Raddle MD Attending Center for [employer] (Faculty Practice) 08/24/2024 Time: [phone]

## 2024-08-31 ENCOUNTER — Ambulatory Visit: Admitting: Physical Therapy

## 2024-09-06 ENCOUNTER — Encounter: Payer: Self-pay | Admitting: Physical Therapy

## 2024-09-06 ENCOUNTER — Other Ambulatory Visit: Payer: Self-pay | Admitting: Maternal & Fetal Medicine

## 2024-09-06 ENCOUNTER — Ambulatory Visit: Attending: Obstetrics and Gynecology

## 2024-09-06 ENCOUNTER — Other Ambulatory Visit: Payer: Self-pay | Admitting: Advanced Practice Midwife

## 2024-09-06 ENCOUNTER — Ambulatory Visit: Attending: Advanced Practice Midwife | Admitting: Physical Therapy

## 2024-09-06 ENCOUNTER — Ambulatory Visit: Admitting: Maternal & Fetal Medicine

## 2024-09-06 VITALS — BP 152/93 | HR 114

## 2024-09-06 DIAGNOSIS — O10919 Unspecified pre-existing hypertension complicating pregnancy, unspecified trimester: Secondary | ICD-10-CM

## 2024-09-06 DIAGNOSIS — O99011 Anemia complicating pregnancy, first trimester: Secondary | ICD-10-CM

## 2024-09-06 DIAGNOSIS — M6281 Muscle weakness (generalized): Secondary | ICD-10-CM | POA: Insufficient documentation

## 2024-09-06 DIAGNOSIS — O99019 Anemia complicating pregnancy, unspecified trimester: Secondary | ICD-10-CM

## 2024-09-06 DIAGNOSIS — R899 Unspecified abnormal finding in specimens from other organs, systems and tissues: Secondary | ICD-10-CM

## 2024-09-06 DIAGNOSIS — M5459 Other low back pain: Secondary | ICD-10-CM | POA: Insufficient documentation

## 2024-09-06 DIAGNOSIS — Z3A25 25 weeks gestation of pregnancy: Secondary | ICD-10-CM

## 2024-09-06 NOTE — Progress Notes (Signed)
 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 G4P3003 at [redacted]w[redacted]d here for ultrasound and consultation. Laura [name] is doing well today with no acute concerns. Today we focused on the following:   The patient presents today for a growth ultrasound due to chronic hypertension, anemia, and an atypical antibody identified early in pregnancy. She also has additional medical conditions that were not addressed today and are being managed by her primary obstetric provider. Her blood pressure was initially elevated at 152/93 mmHg, with repeat measurement of 139/92 mmHg. She reports chronic headaches at baseline with no change from usual and denies any visual disturbances. She also denies right upper quadrant pain. We discussed the risk of superimposed preeclampsia should she develop worsening or new concerning signs or symptoms. The importance of reporting these symptoms promptly to her obstetric provider was emphasized. She was also counseled on home blood pressure monitoring with appropriate parameters provided. The goal blood pressure was discussed as less than  140/90 mmHg. Review of the EMR shows that previously recommended anemia laboratory studies have not yet been completed. Additionally, repeat antibody testing does not appear to have been performed. Middle cerebral artery (MCA) Dopplers were performed today and were normal. Given that the original antibody was too weak to titer and the MCA Dopplers are normal today, it is very unlikely that the fetus is affected by a clinically significant or high-titer antibody at this time.  We discussed the importance of serial growth ultrasounds and antenatal testing, with anticipated delivery timing around 37 weeks' gestation or sooner if clinically indicated.  Today's ultrasound showed the estimated fetal weight at the 44th percentile overall with normal fluid and normal-appearing anatomic structures.  Some anatomy has been suboptimally visualized and we will continue to assess this on future ultrasounds.  The patient had time to ask questions that were answered to her satisfaction.  She verbalized understanding and agrees to proceed with the plan below.  Standard OB precautions were given to the patient.  Recommendations -Monitor blood pressure at home with goal <140/90 and report persistent elevations -Report symptoms concerning for preeclampsia including worsening headaches, visual changes, right upper quadrant pain, or increased swelling -Complete previously ordered anemia labs - see prior consult note -Repeat maternal antibody screen as previously recommended -Continue serial growth ultrasounds -Continue weekly antenatal testing with primary OB -Plan for delivery around 37 weeks or earlier if clinically indicated  I spent 30 minutes reviewing the patients chart, including labs and images as well as counseling the patient about her medical conditions. Greater than 50% of the time was spent in direct face-to-face patient counseling.  Delora Smaller  MFM, [place]   09/06/2024  11:44 AM   Review of  Systems:  A review of systems was performed and was negative except per HPI   Vitals and Physical Exam    09/06/2024   11:30 AM 09/06/2024   10:21 AM 08/16/2024    9:14 AM  Vitals with BMI  Weight   148 lbs  BMI   26.22  Systolic 139 152 871  Diastolic 92 93 87  Pulse 109 114 109    Sitting comfortably on the sonogram table Nonlabored breathing Normal rate and rhythm Abdomen is nontender  Past pregnancies OB History  Gravida Para Term Preterm AB Living  4 3 3  0 0 3  SAB IAB Ectopic Multiple Live Births  0 0 0 0 3    # Outcome Date GA Lbr Len/2nd Weight Sex Type Anes PTL Lv  4 Current           3 Term 09/02/21 [redacted]w[redacted]d / 00:09 5 lb 14.7 oz (2.685 kg) F Vag-Spont EPI N LIV  2 Term 04/12/20 [redacted]w[redacted]d 03:45 / 00:51 7 lb 1 oz (3.204 kg) M Vag-Spont EPI N LIV  1 Term 01/30/18 [redacted]w[redacted]d 03:24 / 00:30 7 lb 0.2 oz (3.182 kg) F Vag-Spont EPI N LIV     Future Appointments  Date Time Provider Department Center  09/13/2024  9:00 AM CENTERING PROVIDER [facility]  09/13/2024 12:30 PM Antonetta Hastings C, PT [facility] None  09/20/2024 11:45 AM Sophie Burnard HERO, PT [facility] None  09/27/2024  9:00 AM CENTERING PROVIDER [facility]  09/27/2024 12:30 PM Antonetta Hastings C, PT [facility] None  10/04/2024  7:15 AM [facility] PROVIDER 1 [facility]  10/04/2024  7:30 AM [facility] US5 [facility]  10/04/2024  8:45 AM Simpson, Stacy C, PT [facility] None  10/11/2024  9:00 AM CENTERING PROVIDER [facility]  10/11/2024 11:45 AM Antonetta Hastings C, PT [facility] None  10/25/2024  9:00 AM CENTERING PROVIDER [facility]  11/08/2024  9:00 AM CENTERING PROVIDER [facility]  11/22/2024  9:00 AM CENTERING PROVIDER [facility]  11/29/2024  9:00 AM CENTERING PROVIDER [facility]  12/06/2024  9:00 AM CENTERING PROVIDER [facility]

## 2024-09-06 NOTE — Therapy (Signed)
 OUTPATIENT PHYSICAL THERAPY THORACOLUMBAR TREATMENT   Patient Name: Laura [name] MRN: [id number] DOB:02-14-2001, 23 y.o., female Today's Date: 09/06/2024  END OF SESSION:  PT End of Session - 09/06/24 0842     Visit Number 4    Date for Recertification  12/06/24    Authorization Type 2 insurances: UHC/ [facility] Medicaid 16 visits 11/18-3/10    Authorization - Visit Number 3    Authorization - Number of Visits 16    PT Start Time 0845    PT Stop Time 0925    PT Time Calculation (min) 40 min    Activity Tolerance Patient tolerated treatment well           Past Medical History:  Diagnosis Date   Anemia    Anxiety    Asthma    Chlamydia    Depression    Genital herpes    Gonorrhea    Leukopenia 01/07/2021   Ovarian cyst    PID (acute pelvic inflammatory disease)    TOA (tubo-ovarian abscess) 12/28/2023   UTI (urinary tract infection)    Past Surgical History:  Procedure Laterality Date   OVARIAN CYST REMOVAL Left 2020   Patient Active Problem List   Diagnosis Date Noted   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 07/29/2024   Chronic hypertension affecting pregnancy 07/19/2024   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 06/03/2024   Anemia in pregnancy, first trimester 05/26/2024   Nausea and vomiting during pregnancy 05/26/2024   Supervision of high risk pregnancy, antepartum, second trimester 05/17/2024   Biological false positive RPR test 12/30/2023   Depressive disorder 03/15/2023   GAD (generalized anxiety disorder) 05/13/2022   PTSD (post-traumatic stress disorder) 05/13/2022   Asthma affecting pregnancy in second trimester 08/15/2019    PCP: [facility]  REFERRING PROVIDER: Milly Planas CNM  REFERRING DIAG: O99.891, M54.9 back pain affecting pregnancy in 2nd trimester  Rationale for Evaluation and Treatment: Rehabilitation  THERAPY DIAG:  Back pain during pregnancy ONSET DATE: 2019, worsening during current  pregancy  SUBJECTIVE:                                                                                                                                                                                           SUBJECTIVE STATEMENT: Losing some mucus a little too early.  They told me to take it easy.  No contractions.  Still having back pain.  Hurts with sitting too long.  Will feel it in low back.  Haven't felt like walking.  Have an appt today for an U/S.  Due date 3/23  High risk pregnancy: BP (monitoring for pre-eclampsia and pelvic  pain) Has 3, 4 and 6 year olds at home   PERTINENT HISTORY:  Chronic HTN; asthma; generalized anxiety disorder; PTSD  PAIN: 09/06/24  Are you having pain? 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 No   OCCUPATION: unable to work at [employer] secondary to unable to provide accomodations  PLOF: Independent  PATIENT GOALS: help with back pain (never has done PT)  OBJECTIVE:  Note: Objective measures were completed at Evaluation unless otherwise noted. 08/18/24: UIQ-7: 81               External Perineal Exam: gaping with visibly anterior/posterior vaginal walls present                             Internal Pelvic Floor: no tenderness  Patient confirms identification and approves PT to assess internal pelvic floor and treatment Yes All internal or external pelvic floor assessments and/or treatments are completed with proper hand hygiene and gloves hands. If needed gloves are changed with hand hygiene during patient care time.  PELVIC  MMT:   MMT eval  Vaginal 1/5, 2 second hold, 6 repeat contractions  (Blank rows = not tested)        TONE: WNL  PROLAPSE: Grade 2 anterior/posterior vaginal wall laxity    EVAL: PATIENT SURVEYS:      Modified Oswestry:  MODIFIED OSWESTRY DISABILITY SCALE  Date: 11/18 Score  Pain intensity 3 =  Pain medication provides me with moderate relief from pain.  2. Personal care (washing, dressing, etc.) 1 =  I can take care of myself normally, but it increases my pain.  3. Lifting 4 = I can lift only very light weights  4. Walking 2 =  Pain prevents me from walking more than  mile.  5. Sitting 1 =  I can only sit in my favorite chair as long as I like.  6. Standing 2 =  Pain prevents me from standing more than 1 hour  7. Sleeping 1 = I can sleep well only by using pain medication.  8. Social Life 2 = Pain prevents me from participating in more energetic activities (eg. sports, dancing).  9. Traveling 0 =  I can travel anywhere without increased pain.  10. Employment/ Homemaking 1 = My normal homemaking/job activities increase my pain, but I can still perform all that is required of me  Total 34%   Interpretation of scores: Score Category Description  0-20% Minimal Disability The patient can cope with most living activities. Usually no treatment is indicated apart from advice on lifting, sitting and exercise  21-40% Moderate Disability The patient experiences more pain and difficulty with sitting, lifting and standing. Travel and social life are more difficult and they may be disabled from work. Personal care, sexual activity and sleeping are not grossly affected, and the patient can usually be managed by conservative means  41-60% Severe Disability Pain remains the main problem in this group, but activities of daily living are affected. These patients require a detailed investigation  61-80% Crippled  Back pain impinges on all aspects of the patient's life. Positive intervention is  required  81-100% Bed-bound These patients are either bed-bound or exaggerating their symptoms  Bluford FORBES Zoe DELENA Karon DELENA, et al. Surgery versus conservative management of stable thoracolumbar fracture: the PRESTO feasibility RCT. [place] ([place]): [employer]; 2021 Nov. [facility] Technology Assessment, No. 25.62.) Appendix 3, Oswestry Disability Index category descriptors. Available from: Findjewelers.cz  Minimally Clinically Important Difference (MCID) = 12.8%  COGNITION: Overall cognitive status: Within functional limits for tasks assessed      PALPATION: Painful right SI joint line  LUMBAR ROM: pt avoids lumbar flexion, extension and side bending WFLs; decreased lumbofascial mobility with quadruped cat/cow and childs pose   LOWER EXTREMITY ROM:   WFLs  LOWER EXTREMITY MMT:  decreased hip abduction strength bil 4/5  FUNCTIONAL TESTS:  Able to rise sit to stand without UE use  GAIT: Comments: WFLs  TREATMENT DATE:  09/06/24: Propped on wedge:   ball squeeze with TA activation and breathe out on activation 5x10,  hip abduction with pink heavy loop x10 Sidelying:  reverse clam with ball between knees 10x,  UE push down on ball with coordinated exhale 10x,  hip abduction 10x right/left Quadruped with yoga block under knee: cat/cow, rocking forward and back hip shifting 8-10x right/left  KT  H  pattern over lumbo sacral region: advised on removal in 3 days to check skin tolerance       08/23/24: NuStep: level 1x5 minutes- PT present to discuss progress  Ball roll outs: 3 ways: 5 hold x 5 each direction Open book x10 bil Propped on wedge: Low trunk rotation 3x20 seconds, ball squeeze with TA activation and breathe out on activation 5x10, hip abduction with yellow loop 2x10 Seated on ball: pelvic rocking, circles bil  Manual: addaday to Rt>Lt lumbar paraspinals with pt in Rt sidelying.  PT monitored for response.    08/18/24 Manual: Pt provides verbal consent for internal vaginal/rectal pelvic floor exam. Internal vaginal pelvic floor muscle assessment Neuromuscular re-education: Transversus abdominus training with multimodal cues for improved motor control and breath coordination Internal vaginal pelvic floor muscle contraction training Quick flicks 10x Long holds 2x10 seconds Urge dirll 2x The knack 1x Seated hip adduction ball press with transversus abdominus and pelvic floor muscle 2 x 10 Seated hip abduction red band with transversus abdominus and pelvic floor muscle 2 x 10 Cat cow 10x Child's pose 10 breaths Therapeutic activities: Squatty potty/toilet position Exhale with pushing Talking to midwife about magnesium oxide and psyllium husk   08/16/24 evaluation    Childs pose 3x Quadruped cat/cow 5x Quadruped tail wags 5x KT  star pattern over lumbo sacral region: advised on removal in 2 days to check skin tolerance                                                                                                                            PATIENT EDUCATION:  Education details: Educated  patient on anatomy and physiology of current symptoms, prognosis, plan of care as well as initial self care strategies to promote recovery Person educated: Patient Education method: Explanation Education comprehension: verbalized understanding  HOME EXERCISE PROGRAM: [id number]  ASSESSMENT:  CLINICAL IMPRESSION: Exercises selected for a lower intensity secondary to pt reports of loss of mucous. Verbal cues for coordination of breathing to optimize pelvic floor relaxation and core activation. She reports some low back discomfort in supine propped on wedge but reports relief with quadruped yoga block mobility ex's.  Therapist monitoring response to all interventions and modifying treatment accordingly.     OBJECTIVE IMPAIRMENTS: decreased activity tolerance, decreased mobility, difficulty walking,  decreased strength, impaired perceived functional ability, and pain.   ACTIVITY LIMITATIONS: carrying, lifting, bending, standing, squatting, locomotion level, and caring for others  PARTICIPATION LIMITATIONS: meal prep, cleaning, laundry, interpersonal relationship, and occupation  PERSONAL FACTORS: Time since onset of injury/illness/exacerbation and 3+ comorbidities: HTN during pregnancy, asthma, anxiety are also affecting patient's functional outcome.   REHAB POTENTIAL: Good  CLINICAL DECISION MAKING: Evolving/moderate complexity  EVALUATION COMPLEXITY: Moderate   GOALS: Goals reviewed with patient? Yes  SHORT TERM GOALS: Target date: 09/27/2024    The patient will demonstrate knowledge of basic self care strategies and exercises  Baseline: Goal status: in progress  2.  The patient will have trunk and LE strength and mobility needed to bend forward needed to take care of her young children Baseline:  Goal status: INITIAL  3.  The patient will report a 30% improvement in pain levels with functional activities which are currently difficult including bending and turning Baseline:  Goal status: INITIAL    LONG TERM GOALS: Target date: 12/06/2024    The patient will be independent in a safe self progression of a home exercise program to promote further recovery of function  Baseline:  Goal status: INITIAL  2.  The patient will report a 60% improvement in pain levels with functional activities which are currently difficult including bending and turning Baseline:  Goal status: INITIAL  3.  The patient will have improved hip strength to at least 4+/5 needed for standing, walking longer distances  Baseline:  Goal status: INITIAL  4.  The patient will have improved trunk flexor and extensor muscle strength needed for lifting her newborn  Baseline:  Goal status: INITIAL  5.  Modified Oswestry functional outcome measure score improved to   26  % indicating improved  function with ADLS with less pain.   Baseline:  Goal status: INITIAL  6. Pt will improve UIQ-7 score to less than 50 in order to demonstrate improved activity tolerance and less impact of bladder control on functional activities.   Baseline: 81  Goal Status: INITIAL  7. Pt will demonstrate normal pelvic floor muscle tone and A/ROM, able to achieve 3/5 strength with contractions and 10 sec endurance, in order to reduce urinary leaking and number of pads patient wears.   Baseline:  Goal Status: INITIAL  8. Pt will reduce pad use to no more than 1 a day in order to decrease monetary impact and risk of infection.   Baseline: 3 a day  Goal status: INITIAL  9. Pt will be able to go 2-3 hours in between voids without urgency or incontinence in order to improve QOL and perform all functional activities with less difficulty.   Baseline: 3-4x/hour  Goal Status: INITIAL  10. Pt will decrease frequency of nightly trips to the bathroom to 1 or less in order to  get restful sleep.   Baseline: every 2 hours  Goal status: INITIAL  PLAN:  PT FREQUENCY: 1-2x/week  PT DURATION: 16 weeks    PLANNED INTERVENTIONS: 97164- PT Re-evaluation, 97750- Physical Performance Testing, 97110-Therapeutic exercises, 97530- Therapeutic activity, [id number]- Neuromuscular re-education, 97535- Self Care, 02859- Manual therapy, [phone]- Aquatic Therapy, Patient/Family education, Taping, Joint mobilization, Cryotherapy, and Moist heat.  PLAN FOR NEXT SESSION: see how U/S went; assess response to KT tape; pelvic mobility ex's;  seated ball ex's; low level core; monitor BP and symptom response  Pelvic floor: splinting techniques to help with constipation; revisit what midwife says about fiber/magnesium; core training with use of small and medium stability ball, quadruped positions; go over pelvic stability belt for support and pain reduction    Glade Pesa, PT 09/06/24 5:20 PM Phone: [phone] Fax:  [phone]  [facility] [address] [place], [place] [address] Phone # [phone] Fax [phone]

## 2024-09-07 ENCOUNTER — Encounter: Payer: Self-pay | Admitting: *Deleted

## 2024-09-07 ENCOUNTER — Telehealth: Payer: Self-pay | Admitting: *Deleted

## 2024-09-07 DIAGNOSIS — O99011 Anemia complicating pregnancy, first trimester: Secondary | ICD-10-CM

## 2024-09-07 DIAGNOSIS — O0990 Supervision of high risk pregnancy, unspecified, unspecified trimester: Secondary | ICD-10-CM

## 2024-09-07 DIAGNOSIS — R898 Other abnormal findings in specimens from other organs, systems and tissues: Secondary | ICD-10-CM

## 2024-09-07 NOTE — Telephone Encounter (Signed)
 I called Laura [name] and left a message I am calling about scheduling a lab visit and will send a detailed MyChart message for her to read and respond to or call us . Rock Skip PEAK

## 2024-09-07 NOTE — Telephone Encounter (Signed)
-----   Message from Olam Boards sent at 09/06/2024 12:08 PM EST ----- Regarding: centering patient lab need Laura [name], Laura [name] needs to have her antibody screen repeated as soon as possible.  I planned to do it with her 28 week labs but Dr William talked with me and wants to do it sooner.  Since we are drawing labs, I will add her iron panel too. We can do her glucose testing at her 12/30 Centering visit.  Can you let the patient know about the lab only visit and get her scheduled?  Thank you!  Olam

## 2024-09-08 NOTE — Telephone Encounter (Signed)
 Per chart review, pt responded to Mychart message stating that she can come in to office for lab visit. I called her to get appt scheduled and she did not answer. Lab appointment scheduled for Monday 12/15 @ 10:00 am. Mychart message sent to pt.

## 2024-09-08 NOTE — Addendum Note (Signed)
 Addended by: MICHAE LOA CROME on: 09/08/2024 03:24 PM   Modules accepted: Orders

## 2024-09-10 ENCOUNTER — Inpatient Hospital Stay (HOSPITAL_COMMUNITY)

## 2024-09-10 ENCOUNTER — Encounter (HOSPITAL_COMMUNITY): Payer: Self-pay | Admitting: Obstetrics & Gynecology

## 2024-09-10 ENCOUNTER — Inpatient Hospital Stay (HOSPITAL_COMMUNITY)
Admission: AD | Admit: 2024-09-10 | Discharge: 2024-09-10 | Disposition: A | Discharge status: Home / Self Care | Attending: Obstetrics & Gynecology | Admitting: Obstetrics & Gynecology

## 2024-09-10 ENCOUNTER — Other Ambulatory Visit: Payer: Self-pay | Admitting: Student

## 2024-09-10 DIAGNOSIS — O99512 Diseases of the respiratory system complicating pregnancy, second trimester: Secondary | ICD-10-CM | POA: Diagnosis not present

## 2024-09-10 DIAGNOSIS — R0602 Shortness of breath: Secondary | ICD-10-CM | POA: Diagnosis present

## 2024-09-10 DIAGNOSIS — Z3A25 25 weeks gestation of pregnancy: Secondary | ICD-10-CM

## 2024-09-10 DIAGNOSIS — J45909 Unspecified asthma, uncomplicated: Secondary | ICD-10-CM

## 2024-09-10 DIAGNOSIS — O99012 Anemia complicating pregnancy, second trimester: Secondary | ICD-10-CM | POA: Diagnosis not present

## 2024-09-10 DIAGNOSIS — O99891 Other specified diseases and conditions complicating pregnancy: Secondary | ICD-10-CM | POA: Diagnosis not present

## 2024-09-10 LAB — COMPREHENSIVE METABOLIC PANEL WITH GFR
ALT: 19 U/L (ref 0–44)
AST: 30 U/L (ref 15–41)
Albumin: 2.8 g/dL — ABNORMAL LOW (ref 3.5–5.0)
Alkaline Phosphatase: 54 U/L (ref 38–126)
Anion gap: 7 (ref 5–15)
BUN: 5 mg/dL — ABNORMAL LOW (ref 6–20)
CO2: 22 mmol/L (ref 22–32)
Calcium: 8.5 mg/dL — ABNORMAL LOW (ref 8.9–10.3)
Chloride: 106 mmol/L (ref 98–111)
Creatinine, Ser: 0.66 mg/dL (ref 0.44–1.00)
GFR, Estimated: >60 mL/min (ref >60)
Glucose, Bld: 86 mg/dL (ref 70–99)
Potassium: 3.4 mmol/L — ABNORMAL LOW (ref 3.5–5.1)
Sodium: 135 mmol/L (ref 135–145)
Total Bilirubin: 0.2 mg/dL (ref 0.0–1.2)
Total Protein: 6.7 g/dL (ref 6.5–8.1)

## 2024-09-10 LAB — URINALYSIS, ROUTINE W REFLEX MICROSCOPIC
Bilirubin Urine: NEGATIVE
Glucose, UA: NEGATIVE mg/dL
Ketones, ur: NEGATIVE mg/dL
Nitrite: NEGATIVE
Protein, ur: NEGATIVE mg/dL
Specific Gravity, Urine: 1.014 (ref 1.005–1.030)
pH: 8 (ref 5.0–8.0)

## 2024-09-10 LAB — CBC
HCT: 26.9 % — ABNORMAL LOW (ref 36.0–46.0)
Hemoglobin: 8.2 g/dL — ABNORMAL LOW (ref 12.0–15.0)
MCH: 22 pg — ABNORMAL LOW (ref 26.0–34.0)
MCHC: 30.5 g/dL (ref 30.0–36.0)
MCV: 72.1 fL — ABNORMAL LOW (ref 80.0–100.0)
Platelets: 190 K/uL (ref 150–400)
RBC: 3.73 MIL/uL — ABNORMAL LOW (ref 3.87–5.11)
RDW: 15.1 % (ref 11.5–15.5)
WBC: 5.9 K/uL (ref 4.0–10.5)
nRBC: 0.8 % — ABNORMAL HIGH (ref 0.0–0.2)

## 2024-09-10 LAB — PROTEIN / CREATININE RATIO, URINE
Creatinine, Urine: 98 mg/dL
Protein Creatinine Ratio: 0.24 mg/mg{creat} — ABNORMAL HIGH (ref 0.00–0.15)
Total Protein, Urine: 24 mg/dL

## 2024-09-10 LAB — RESP PANEL BY RT-PCR (RSV, FLU A&B, COVID)  RVPGX2
Influenza A by PCR: NEGATIVE
Influenza B by PCR: NEGATIVE
Resp Syncytial Virus by PCR: NEGATIVE
SARS Coronavirus 2 by RT PCR: NEGATIVE

## 2024-09-10 LAB — TROPONIN I (HIGH SENSITIVITY): Troponin I (High Sensitivity): 3 ng/L (ref <18)

## 2024-09-10 LAB — BRAIN NATRIURETIC PEPTIDE: B Natriuretic Peptide: 21 pg/mL (ref 0.0–100.0)

## 2024-09-10 LAB — D-DIMER, QUANTITATIVE: D-Dimer, Quant: 1.01 ug{FEU}/mL — ABNORMAL HIGH (ref 0.00–0.50)

## 2024-09-10 MED ORDER — ALUM & MAG HYDROXIDE-SIMETH 200-200-20 MG/5ML PO SUSP
30.0000 mL | Freq: Once | ORAL | Status: AC
  Administered 2024-09-10: 30 mL via ORAL
  Filled 2024-09-10: qty 30

## 2024-09-10 MED ORDER — LIDOCAINE VISCOUS HCL 2 % MT SOLN
15.0000 mL | Freq: Once | OROMUCOSAL | Status: AC
  Administered 2024-09-10: 15 mL via ORAL
  Filled 2024-09-10: qty 15

## 2024-09-10 MED ORDER — IOHEXOL 350 MG/ML SOLN
75.0000 mL | Freq: Once | INTRAVENOUS | Status: AC | PRN
  Administered 2024-09-10: 75 mL via INTRAVENOUS

## 2024-09-10 MED ORDER — ACETAMINOPHEN 500 MG PO TABS
1000.0000 mg | ORAL_TABLET | Freq: Once | ORAL | Status: AC
  Administered 2024-09-10: 1000 mg via ORAL
  Filled 2024-09-10: qty 2

## 2024-09-10 MED ORDER — IPRATROPIUM-ALBUTEROL 0.5-2.5 (3) MG/3ML IN SOLN
3.0000 mL | Freq: Once | RESPIRATORY_TRACT | Status: AC
  Administered 2024-09-10: 3 mL via RESPIRATORY_TRACT
  Filled 2024-09-10: qty 3

## 2024-09-10 MED ORDER — CYCLOBENZAPRINE HCL 5 MG PO TABS
10.0000 mg | ORAL_TABLET | Freq: Once | ORAL | Status: AC
  Administered 2024-09-10: 10 mg via ORAL
  Filled 2024-09-10: qty 2

## 2024-09-10 MED ORDER — PREDNISONE 50 MG PO TABS: 50.0000 mg | ORAL_TABLET | Freq: Every day | ORAL | 0 refills | Status: AC

## 2024-09-10 NOTE — MAU Provider Note (Signed)
 History     CSN: [id number]  Arrival date and time: 09/10/24 1122   Event Date/Time   First Provider Initiated Contact with Patient 09/10/24 1150      Chief Complaint  Patient presents with   Shortness of Breath   HPI Laura [name] is a 23 y.o. G4P3003 at [redacted]w[redacted]d who presents with SOB & chest pain. Reports ongoing SOB with the pregnancy that worsened this morning. States SOB worse with walking. Also now feels chest pain/tightness. Has had non productive cough since yesterday. Denies fever/chills, headache, visual disturbance, or epigastric pain. Denies abdominal pain, vaginal bleeding, or LOF. Denies recent travel, calf pain, or hx of DVT/VTE.  CHTN on meds, took dose this morning.  Takes Symbicort & albuterol  for asthma. Last used albuterol  2 days ago. Has never been hospitalized or intubated for asthma. States this does not feel like asthma exacerbation.  +FM  OB History     Gravida  4   Para  3   Term  3   Preterm  0   AB  0   Living  3      SAB  0   IAB  0   Ectopic  0   Multiple  0   Live Births  3           Past Medical History:  Diagnosis Date   Anemia    Anxiety    Asthma    Chlamydia    Depression    Genital herpes    Gonorrhea    Leukopenia 01/07/2021   PID (acute pelvic inflammatory disease)    TOA (tubo-ovarian abscess) 12/28/2023    Past Surgical History:  Procedure Laterality Date   OVARIAN CYST REMOVAL Left 2020    Family History  Problem Relation Age of Onset   Asthma Mother    Epilepsy Father    Arthritis Maternal Grandmother    Cancer Maternal Grandfather    Diabetes Maternal Grandfather    Kidney disease Maternal Grandfather    Diabetes Maternal Aunt    Anxiety disorder Paternal Aunt    Depression Paternal Aunt    Bleeding Disorder Paternal Aunt     Social History[1]  Allergies: Allergies[2]  No medications prior to admission.    Review of Systems  All other systems reviewed and are negative.  Physical  Exam   Blood pressure 124/81, pulse (!) 109, temperature 98.1 F (36.7 C), temperature source Oral, resp. rate 20, SpO2 100%.  Physical Exam Vitals and nursing note reviewed.  Constitutional:      General: She is in acute distress.     Appearance: She is well-developed. She is diaphoretic. She is not ill-appearing.  HENT:     Head: Normocephalic and atraumatic.  Eyes:     General: No scleral icterus.       Right eye: No discharge.        Left eye: No discharge.     Conjunctiva/sclera: Conjunctivae normal.  Cardiovascular:     Rate and Rhythm: Regular rhythm. Tachycardia present.  Pulmonary:     Effort: Pulmonary effort is normal. Tachypnea present. No accessory muscle usage or respiratory distress.     Breath sounds: No wheezing.  Neurological:     General: No focal deficit present.     Mental Status: She is alert.  Psychiatric:        Mood and Affect: Mood normal.        Behavior: Behavior normal.    NST:  Baseline: 155 bpm, Variability:  Good {> 6 bpm), Accelerations: Non-reactive but appropriate for gestational age, and Decelerations: Absent  MAU Course  Procedures Results for orders placed or performed during the hospital encounter of 09/10/24 (from the past 24 hours)  Urinalysis, Routine w reflex microscopic -Urine, Clean Catch     Status: Abnormal   Collection Time: 09/10/24 11:42 AM  Result Value Ref Range   Color, Urine YELLOW YELLOW   APPearance CLOUDY (A) CLEAR   Specific Gravity, Urine 1.014 1.005 - 1.030   pH 8.0 5.0 - 8.0   Glucose, UA NEGATIVE NEGATIVE mg/dL   Hgb urine dipstick SMALL (A) NEGATIVE   Bilirubin Urine NEGATIVE NEGATIVE   Ketones, ur NEGATIVE NEGATIVE mg/dL   Protein, ur NEGATIVE NEGATIVE mg/dL   Nitrite NEGATIVE NEGATIVE   Leukocytes,Ua SMALL (A) NEGATIVE   RBC / HPF 0-5 0 - 5 RBC/hpf   WBC, UA 0-5 0 - 5 WBC/hpf   Bacteria, UA RARE (A) NONE SEEN   Squamous Epithelial / HPF 11-20 0 - 5 /HPF   Mucus PRESENT   Protein / creatinine ratio,  urine     Status: Abnormal   Collection Time: 09/10/24 11:42 AM  Result Value Ref Range   Creatinine, Urine 98 mg/dL   Total Protein, Urine 24 mg/dL   Protein Creatinine Ratio 0.24 (H) 0.00 - 0.15 mg/mg[Cre]  CBC     Status: Abnormal   Collection Time: 09/10/24 11:52 AM  Result Value Ref Range   WBC 5.9 4.0 - 10.5 K/uL   RBC 3.73 (L) 3.87 - 5.11 MIL/uL   Hemoglobin 8.2 (L) 12.0 - 15.0 g/dL   HCT 73.0 (L) 63.9 - 53.9 %   MCV 72.1 (L) 80.0 - 100.0 fL   MCH 22.0 (L) 26.0 - 34.0 pg   MCHC 30.5 30.0 - 36.0 g/dL   RDW 84.8 88.4 - 84.4 %   Platelets 190 150 - 400 K/uL   nRBC 0.8 (H) 0.0 - 0.2 %  Comprehensive metabolic panel with GFR     Status: Abnormal   Collection Time: 09/10/24 11:52 AM  Result Value Ref Range   Sodium 135 135 - 145 mmol/L   Potassium 3.4 (L) 3.5 - 5.1 mmol/L   Chloride 106 98 - 111 mmol/L   CO2 22 22 - 32 mmol/L   Glucose, Bld 86 70 - 99 mg/dL   BUN 5 (L) 6 - 20 mg/dL   Creatinine, Ser 9.33 0.44 - 1.00 mg/dL   Calcium 8.5 (L) 8.9 - 10.3 mg/dL   Total Protein 6.7 6.5 - 8.1 g/dL   Albumin 2.8 (L) 3.5 - 5.0 g/dL   AST 30 15 - 41 U/L   ALT 19 0 - 44 U/L   Alkaline Phosphatase 54 38 - 126 U/L   Total Bilirubin 0.2 0.0 - 1.2 mg/dL   GFR, Estimated >39 >39 mL/min   Anion gap 7 5 - 15  Troponin I (High Sensitivity)     Status: None   Collection Time: 09/10/24 11:52 AM  Result Value Ref Range   Troponin I (High Sensitivity) 3 <18 ng/L  Brain natriuretic peptide     Status: None   Collection Time: 09/10/24 11:52 AM  Result Value Ref Range   B Natriuretic Peptide 21.0 0.0 - 100.0 pg/mL  D-dimer, quantitative     Status: Abnormal   Collection Time: 09/10/24 11:52 AM  Result Value Ref Range   D-Dimer, Quant 1.01 (H) 0.00 - 0.50 ug/mL-FEU  Resp panel by RT-PCR (RSV, Flu A&B, Covid) Anterior  Nasal Swab     Status: None   Collection Time: 09/10/24 11:52 AM   Specimen: Anterior Nasal Swab  Result Value Ref Range   SARS Coronavirus 2 by RT PCR NEGATIVE NEGATIVE    Influenza A by PCR NEGATIVE NEGATIVE   Influenza B by PCR NEGATIVE NEGATIVE   Resp Syncytial Virus by PCR NEGATIVE NEGATIVE   CT Angio Chest PE W and/or Wo Contrast Result Date: 09/10/2024 CLINICAL DATA:  Pulmonary embolism (PE) suspected, pregnant EXAM: CT ANGIOGRAPHY CHEST WITH CONTRAST TECHNIQUE: Multidetector CT imaging of the chest was performed using the standard protocol during bolus administration of intravenous contrast. Multiplanar CT image reconstructions and MIPs were obtained to evaluate the vascular anatomy. RADIATION DOSE REDUCTION: This exam was performed according to the departmental dose-optimization program which includes automated exposure control, adjustment of the mA and/or kV according to patient size and/or use of iterative reconstruction technique. CONTRAST:  75mL OMNIPAQUE  IOHEXOL  350 MG/ML SOLN COMPARISON:  December 30, 2023 FINDINGS: Cardiovascular: Satisfactory opacification of the pulmonary arteries to the segmental level. No evidence of pulmonary embolism. Normal heart size. No pericardial effusion. Mediastinum/Nodes: Visualized thyroid is unremarkable. Small amount of soft tissue in the anterior mediastinum, likely residual thymus. No mediastinal or or axillary adenopathy. Lungs/Pleura: No pleural effusion or pneumothorax. No acute pleuroparenchymal abnormality noted. Upper Abdomen: No acute abnormality. Spleen is prominent spanning 12.6 cm. Musculoskeletal: No chest wall abnormality. No acute or significant osseous findings. Review of the MIP images confirms the above findings. IMPRESSION: 1. No evidence of pulmonary embolism. Electronically Signed   By: Corean Salter M.D.   On: 09/10/2024 15:02   DG Chest Portable 1 View Result Date: 09/10/2024 CLINICAL DATA:  Chest tightness.  Shortness of breath. EXAM: PORTABLE CHEST 1 VIEW COMPARISON:  12/28/2023 FINDINGS: The heart size and mediastinal contours are within normal limits. Both lungs are clear. The visualized skeletal  structures are unremarkable. IMPRESSION: No active disease. Electronically Signed   By: Camellia Candle M.D.   On: 09/10/2024 12:23    MDM   Assessment and Plan   1. Asthma affecting pregnancy in second trimester  -Evaluation in MAU included labs, respiratory panel, EKG, chest xray, & chest CT. No evidence of emergent condition at this time.  -Treated with duoneb & reports some improvement in symptoms after neb, tylenol , & flexeril . Per consult with Dr. Herchel, will send home on steroid burst (prednisone  50 mg x 5 days).  -BPs stable & preeclampsia labs unremarkable.  -Tachycardia improved & down to patient's baseline at end of visit. Tachypnea improved after duoneb -Reviewed reasons to return to MAU  2. Anemia affecting pregnancy in second trimester  -Hgb today is 8.2 -IV iron therapy plan ordered at time of discharge  3. [redacted] weeks gestation of pregnancy      Rocky Satterfield 09/10/2024, 6:00 PM      [1]  Social History Tobacco Use   Smoking status: Never   Smokeless tobacco: Never  Vaping Use   Vaping status: Former  Substance Use Topics   Alcohol use: Not Currently    Alcohol/week: 8.0 - 10.0 standard drinks of alcohol    Types: 8 - 10 Shots of liquor per week    Comment: every other weekend   Drug use: Not Currently    Types: Marijuana    Comment: 2018  [2] No Known Allergies

## 2024-09-10 NOTE — MAU Note (Addendum)
..  Laura [name] is a 22 y.o. at [redacted]w[redacted]d here in MAU reporting: woke up this morning with chest tightness and shortness of breath. She feels like someone is squeezing her chest. She has had SOB the entire pregnancy, but it is much worse today. Additionally, she's had a cough since yesterday. She also reports lightheadedness. Denies Vb or LOF. +FM.   Pain score: 7 Vitals:   09/10/24 1136  BP: (!) 137/91  Pulse: (!) 128  Resp: (!) 24  Temp: 98 F (36.7 C)  SpO2: 100%     FHT:155 Lab orders placed from triage:   UA, EKG

## 2024-09-12 ENCOUNTER — Telehealth (HOSPITAL_COMMUNITY): Payer: Self-pay | Admitting: Pharmacy Technician

## 2024-09-12 ENCOUNTER — Other Ambulatory Visit (HOSPITAL_COMMUNITY): Payer: Self-pay | Admitting: Student

## 2024-09-12 ENCOUNTER — Other Ambulatory Visit: Payer: Self-pay

## 2024-09-12 DIAGNOSIS — D509 Iron deficiency anemia, unspecified: Secondary | ICD-10-CM | POA: Insufficient documentation

## 2024-09-12 NOTE — Telephone Encounter (Signed)
 Auth Submission: NO AUTH NEEDED Site of care: [facility] Payer: UHC, [employer] MEDICAID Medication & CPT/J Code(s) submitted: Venofer (Iron Sucrose) J1756 Diagnosis Code: D50.9, O99.012 Route of submission (phone, fax, portal):  Phone # Fax # Auth type: Buy/Bill HB Units/visits requested: 200MG  X 5 DOSES Reference number: [id number] Approval from: 09/12/2024 to 11/13/24    Dagoberto Armour, CPhT [facility] Phone: [phone] 09/12/2024

## 2024-09-13 ENCOUNTER — Other Ambulatory Visit: Payer: Self-pay

## 2024-09-13 ENCOUNTER — Ambulatory Visit: Admitting: Physical Therapy

## 2024-09-13 ENCOUNTER — Ambulatory Visit: Payer: Self-pay

## 2024-09-13 ENCOUNTER — Encounter: Payer: Self-pay | Admitting: Physical Therapy

## 2024-09-13 VITALS — BP 142/101 | HR 109 | Wt 160.0 lb

## 2024-09-13 DIAGNOSIS — R7689 Other specified abnormal immunological findings in serum: Secondary | ICD-10-CM

## 2024-09-13 DIAGNOSIS — R899 Unspecified abnormal finding in specimens from other organs, systems and tissues: Secondary | ICD-10-CM

## 2024-09-13 DIAGNOSIS — O0990 Supervision of high risk pregnancy, unspecified, unspecified trimester: Secondary | ICD-10-CM

## 2024-09-13 DIAGNOSIS — Z3A26 26 weeks gestation of pregnancy: Secondary | ICD-10-CM

## 2024-09-13 DIAGNOSIS — O10913 Unspecified pre-existing hypertension complicating pregnancy, third trimester: Secondary | ICD-10-CM

## 2024-09-13 DIAGNOSIS — M5459 Other low back pain: Secondary | ICD-10-CM

## 2024-09-13 DIAGNOSIS — O10912 Unspecified pre-existing hypertension complicating pregnancy, second trimester: Secondary | ICD-10-CM

## 2024-09-13 DIAGNOSIS — O0992 Supervision of high risk pregnancy, unspecified, second trimester: Secondary | ICD-10-CM | POA: Diagnosis not present

## 2024-09-13 DIAGNOSIS — M6281 Muscle weakness (generalized): Secondary | ICD-10-CM

## 2024-09-13 NOTE — Progress Notes (Signed)
 PRENATAL VISIT NOTE- Centering Pregnancy Cycle 24, Session # 3  Subjective:  Laura [name] is a 23 y.o. [phone] at [redacted]w[redacted]d being seen today for ongoing prenatal care through Centering Pregnancy.  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  Patient reports occasional contractions.  Contractions: Irritability. Vag. Bleeding: None.  Movement: Present. Denies leaking of fluid/ROM.   The following portions of the patient's history were reviewed and updated as appropriate: allergies, current medications, past family history, past medical history, past social history, past surgical history and problem list. Problem list updated.  Objective:   Vitals:   09/13/24 0904  BP: (!) 146/90  Pulse: (!) 109  Weight: 160 lb (72.6 kg)    Fetal Status: Fetal Heart Rate (bpm): 145 Fundal Height: 26 cm Movement: Present     General:  Alert, oriented and cooperative. Patient is in no acute distress.  Skin: Skin is warm and dry. No rash noted.   Cardiovascular: Normal heart rate noted  Respiratory: Normal respiratory effort, no problems with respiration noted  Abdomen: Soft, gravid, appropriate for gestational age.  Pain/Pressure: Present     Pelvic: Cervical exam deferred        Extremities: Normal range of motion.  Edema: None  Mental Status: Normal mood and affect. Normal behavior. Normal judgment and thought content.    Assessment and Plan:  Pregnancy: G4P3003 at [redacted]w[redacted]d  1. Supervision of high risk pregnancy, antepartum (Primary) --Anticipatory guidance about next visits/weeks of pregnancy given.    Centering Pregnancy, Session#3: Reviewed resources in [employer].   Facilitated discussion today:  Stress/Stress reduction and breastfeeding/infant nutrition Mindfulness activity completed with visualization, discussion facilitated about oral health, nutrition, and breastfeeding.  Fundal height and FHR appropriate today unless noted otherwise in plan. Patient to continue group care.   - CBC; Future - Glucose Tolerance, 2 Hours w/1 Hour; Future - HIV Antibody (routine testing w rflx); Future  2. Chronic hypertension with exacerbation during pregnancy in third trimester --BP elevated today, pt taking Procardia  30 XL daily. Also having frequent h/a, since onset of HTN.  No recent changes or other s/sx of PEC.   --Change medication to labetalol 400 mg BID --BP check in 2 days, labs today  - CBC - Comprehensive metabolic panel with GFR - Protein / creatinine ratio, urine  3. Abnormal laboratory test --Antibody screen positive, low titer, repeat not collected, will collect on  Friday  4. Biological false positive RPR test  - RPR; Future  5. [redacted] weeks gestation of pregnancy     Preterm labor symptoms and general obstetric precautions including but not limited to vaginal bleeding, contractions, leaking of fluid and fetal movement were reviewed in detail with the patient. Please refer to After Visit Summary for other counseling recommendations.  No follow-ups on file.  Future Appointments  Date Time Provider Department Center  09/13/2024 12:30 PM Antonetta Glade BROCKS, PT [facility] None  09/16/2024  9:00 AM [facility] NURSE [facility]  09/16/2024 10:40 AM [facility] LAB [facility]  09/20/2024 11:45 AM Takacs, Burnard HERO, PT [facility] None  09/26/2024 10:00 AM [facility] None  09/27/2024   9:00 AM CENTERING PROVIDER [facility]  09/27/2024 12:30 PM Simpson, Stacy C, PT [facility] None  09/28/2024 10:00 AM [facility] None  09/30/2024 10:00 AM [facility] None  10/03/2024 10:00 AM [facility] None  10/04/2024  7:15 AM [facility] PROVIDER 1 [facility]  10/04/2024  7:30 AM [facility] US5 [facility]  10/04/2024  8:45 AM Simpson, Stacy C, PT [facility] None  10/05/2024 10:00 AM [facility] None  10/11/2024  9:00 AM CENTERING PROVIDER [facility]  10/11/2024 11:45 AM Antonetta Hastings C, PT [facility] None  10/25/2024  9:00 AM CENTERING PROVIDER [facility]  11/08/2024  9:00 AM CENTERING PROVIDER [facility]  11/22/2024  9:00 AM CENTERING PROVIDER [facility]  11/29/2024  9:00 AM CENTERING PROVIDER [facility]  12/06/2024  9:00 AM CENTERING PROVIDER [facility]    Olam Boards, CNM

## 2024-09-13 NOTE — Therapy (Signed)
 OUTPATIENT PHYSICAL THERAPY THORACOLUMBAR TREATMENT   Patient Name: Laura [name] MRN: [id number] DOB:2001/04/10, 23 y.o., female Today's Date: 09/13/2024  END OF SESSION:  PT End of Session - 09/13/24 1231     Visit Number 5    Date for Recertification  12/06/24    Authorization Type 2 insurances: UHC/ [facility] Medicaid 16 visits 11/18-3/10    Authorization - Visit Number 4    Authorization - Number of Visits 16    PT Start Time 1231    PT Stop Time 1315    PT Time Calculation (min) 44 min    Activity Tolerance Patient tolerated treatment well           Past Medical History:  Diagnosis Date   Anemia    Anxiety    Asthma    Chlamydia    Depression    Genital herpes    Gonorrhea    Leukopenia 01/07/2021   PID (acute pelvic inflammatory disease)    TOA (tubo-ovarian abscess) 12/28/2023   Past Surgical History:  Procedure Laterality Date   OVARIAN CYST REMOVAL Left 2020   Patient Active Problem List   Diagnosis Date Noted   Iron deficiency anemia, unspecified 09/12/2024   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 07/29/2024   Chronic hypertension affecting pregnancy 07/19/2024   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 06/03/2024   Anemia affecting pregnancy in second trimester 05/26/2024   Nausea and vomiting during pregnancy 05/26/2024   Supervision of high risk pregnancy, antepartum, second trimester 05/17/2024   Biological false positive RPR test 12/30/2023   Depressive disorder 03/15/2023   GAD (generalized anxiety disorder) 05/13/2022   PTSD (post-traumatic stress disorder) 05/13/2022   Asthma affecting pregnancy in second trimester 08/15/2019    PCP: [facility]  REFERRING PROVIDER: Milly Planas CNM  REFERRING DIAG: O99.891, M54.9 back pain affecting pregnancy in 2nd trimester  Rationale for Evaluation and Treatment: Rehabilitation  THERAPY DIAG:  Back pain during pregnancy ONSET DATE: 2019, worsening during current  pregancy  SUBJECTIVE:                                                                                                                                                                                           SUBJECTIVE STATEMENT: States BP this morning 146/102 at the ob office at 10:30. They are going to induce me earlier end of Feb/early March.  They are going to switch my medication. I had an US  and she's big.  Not much back pain unless I sit too long.  Being monitored every 2 weeks.  The KT tape was great!  Due date 3/23  High risk pregnancy:  BP (monitoring for pre-eclampsia and pelvic pain) Has 3, 4 and 6 year olds at home   PERTINENT HISTORY:  Chronic HTN; asthma; generalized anxiety disorder; PTSD  PAIN: 09/13/24  Are you having pain? 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 No   OCCUPATION: unable to work at [employer] secondary to unable to provide accomodations  PLOF: Independent  PATIENT GOALS: help with back pain (never has done PT)  OBJECTIVE:  Note: Objective measures were completed at Evaluation unless otherwise noted. 08/18/24: UIQ-7: 81               External Perineal Exam: gaping with visibly anterior/posterior vaginal walls present                             Internal Pelvic Floor: no tenderness  Patient confirms identification and approves PT to assess internal pelvic floor and treatment Yes All internal or external pelvic floor assessments and/or treatments are completed with proper hand hygiene and gloves hands. If needed gloves are changed  with hand hygiene during patient care time.  PELVIC MMT:   MMT eval  Vaginal 1/5, 2 second hold, 6 repeat contractions  (Blank rows = not tested)        TONE: WNL  PROLAPSE: Grade 2 anterior/posterior vaginal wall laxity    EVAL: PATIENT SURVEYS:      Modified Oswestry:  MODIFIED OSWESTRY DISABILITY SCALE  Date: 11/18 Score  Pain intensity 3 =  Pain medication provides me with moderate relief from pain.  2. Personal care (washing, dressing, etc.) 1 =  I can take care of myself normally, but it increases my pain.  3. Lifting 4 = I can lift only very light weights  4. Walking 2 =  Pain prevents me from walking more than  mile.  5. Sitting 1 =  I can only sit in my favorite chair as long as I like.  6. Standing 2 =  Pain prevents me from standing more than 1 hour  7. Sleeping 1 = I can sleep well only by using pain medication.  8. Social Life 2 = Pain prevents me from participating in more energetic activities (eg. sports, dancing).  9. Traveling 0 =  I can travel anywhere without increased pain.  10. Employment/ Homemaking 1 = My normal homemaking/job activities increase my pain, but I can still perform all that is required of me  Total 34%   Interpretation of scores: Score Category Description  0-20% Minimal Disability The patient can cope with most living activities. Usually no treatment is indicated apart from advice on lifting, sitting and exercise  21-40% Moderate Disability The patient experiences more pain and difficulty with sitting, lifting and standing. Travel and social life are more difficult and they may be disabled from work. Personal care, sexual activity and sleeping are not grossly affected, and the patient can usually be managed by conservative means  41-60% Severe Disability Pain remains the main problem in this group, but activities of daily living are affected. These patients require  a detailed investigation  61-80% Crippled Back pain impinges on all  aspects of the patients life. Positive intervention is required  81-100% Bed-bound These patients are either bed-bound or exaggerating their symptoms  Bluford FORBES Zoe DELENA Karon DELENA, et al. Surgery versus conservative management of stable thoracolumbar fracture: the PRESTO feasibility RCT. [place] ([place]): [employer]; 2021 Nov. [facility] Technology Assessment, No. 25.62.) Appendix 3, Oswestry Disability Index category descriptors. Available from: [email or web address].cz  Minimally Clinically Important Difference (MCID) = 12.8%  COGNITION: Overall cognitive status: Within functional limits for tasks assessed      PALPATION: Painful right SI joint line  LUMBAR ROM: pt avoids lumbar flexion, extension and side bending WFLs; decreased lumbofascial mobility with quadruped cat/cow and childs pose   LOWER EXTREMITY ROM:   WFLs  LOWER EXTREMITY MMT:  decreased hip abduction strength bil 4/5  FUNCTIONAL TESTS:  Able to rise sit to stand without UE use  GAIT: Comments: WFLs  TREATMENT DATE:  09/13/24: Supine Propped on wedge:   Lumbar rotation discomfort in right low back Piriformis: tender with achieving position  ball squeeze with TA activation and breathe out on activation 5x10,  hip abduction with red heavy loop x10 Sidelying:  Flexion/rotation lumbar stretch right side Foam roll forward and back for hip protrusion/retraction 10x right/eft Open books right/left 10x  Clam  10x right/left Quadruped: cat/cow, rocking forward and back hip shifting 8-10x right/left  KT  H  pattern over lumbo sacral region (pt requests it to be tighter) skin has done fine so far  09/06/24: Propped on wedge:   ball squeeze with TA activation and breathe out on activation 5x10,  hip abduction with pink heavy loop x10 Sidelying:  reverse clam with ball between knees 10x,  UE push down on ball with coordinated exhale 10x,  hip abduction 10x right/left Quadruped with  yoga block under knee: cat/cow, rocking forward and back hip shifting 8-10x right/left  KT  H  pattern over lumbo sacral region: advised on removal in 3 days to check skin tolerance       08/23/24: NuStep: level 1x5 minutes- PT present to discuss progress  Ball roll outs: 3 ways: 5 hold x 5 each direction Open book x10 bil Propped on wedge: Low trunk rotation 3x20 seconds, ball squeeze with TA activation and breathe out on activation 5x10, hip abduction with yellow loop 2x10 Seated on ball: pelvic rocking, circles bil  Manual: addaday to Rt>Lt lumbar paraspinals with pt in Rt sidelying.  PT monitored for response.   08/18/24 Manual: Pt provides verbal consent for internal vaginal/rectal pelvic floor exam. Internal vaginal pelvic floor muscle assessment Neuromuscular re-education: Transversus abdominus training with multimodal cues for improved motor control and breath coordination Internal vaginal pelvic floor muscle contraction training Quick flicks 10x Long holds 2x10 seconds Urge dirll 2x The knack 1x Seated hip adduction ball press with transversus abdominus and pelvic floor muscle 2 x 10 Seated hip abduction red band with transversus abdominus and pelvic floor muscle 2 x 10 Cat cow 10x Child's pose 10 breaths Therapeutic activities: Squatty potty/toilet position Exhale with pushing Talking to midwife about magnesium oxide and psyllium husk   08/16/24 evaluation    Childs pose 3x Quadruped cat/cow 5x Quadruped tail wags 5x KT  star pattern over lumbo sacral region: advised on removal in 2 days to check skin tolerance  PATIENT EDUCATION:  Education details: Educated patient on anatomy and physiology of current symptoms, prognosis, plan of care as well as initial self care strategies to promote recovery Person educated: Patient Education method:  Explanation Education comprehension: verbalized understanding  HOME EXERCISE PROGRAM: [id number]  ASSESSMENT:  CLINICAL IMPRESSION: Cambelle reports much improved back pain, typically only with prolonged sitting now.  She has responded well to mobility ex's as well as KT taping.  Therapist closely monitoring response and ensuring intensity remains low secondary to ongoing high blood pressure.     OBJECTIVE IMPAIRMENTS: decreased activity tolerance, decreased mobility, difficulty walking, decreased strength, impaired perceived functional ability, and pain.   ACTIVITY LIMITATIONS: carrying, lifting, bending, standing, squatting, locomotion level, and caring for others  PARTICIPATION LIMITATIONS: meal prep, cleaning, laundry, interpersonal relationship, and occupation  PERSONAL FACTORS: Time since onset of injury/illness/exacerbation and 3+ comorbidities: HTN during pregnancy, asthma, anxiety are also affecting patient's functional outcome.   REHAB POTENTIAL: Good  CLINICAL DECISION MAKING: Evolving/moderate complexity  EVALUATION COMPLEXITY: Moderate   GOALS: Goals reviewed with patient? Yes  SHORT TERM GOALS: Target date: 09/27/2024    The patient will demonstrate knowledge of basic self care strategies and exercises  Baseline: Goal status: in progress  2.  The patient will have trunk and LE strength and mobility needed to bend forward needed to take care of her young children Baseline:  Goal status: met 12/16 3.  The patient will report a 30% improvement in pain levels with functional activities which are currently difficult including bending and turning Baseline:  Goal status: 12/16    LONG TERM GOALS: Target date: 12/06/2024    The patient will be independent in a safe self progression of a home exercise program to promote further recovery of function  Baseline:  Goal status: INITIAL  2.  The patient will report a 60% improvement in pain levels with functional  activities which are currently difficult including bending and turning Baseline:  Goal status: INITIAL  3.  The patient will have improved hip strength to at least 4+/5 needed for standing, walking longer distances  Baseline:  Goal status: INITIAL  4.  The patient will have improved trunk flexor and extensor muscle strength needed for lifting her newborn  Baseline:  Goal status: INITIAL  5.  Modified Oswestry functional outcome measure score improved to   26  % indicating improved function with ADLS with less pain.   Baseline:  Goal status: INITIAL  6. Pt will improve UIQ-7 score to less than 50 in order to demonstrate improved activity tolerance and less impact of bladder control on functional activities.   Baseline: 81  Goal Status: INITIAL  7. Pt will demonstrate normal pelvic floor muscle tone and A/ROM, able to achieve 3/5 strength with contractions and 10 sec endurance, in order to reduce urinary leaking and number of pads patient wears.   Baseline:  Goal Status: INITIAL  8. Pt will reduce pad use to no more than 1 a day in order to decrease monetary impact and risk of infection.   Baseline: 3 a day  Goal status: INITIAL  9. Pt will be able to go 2-3 hours in between voids without urgency or incontinence in order to improve QOL and perform all functional activities with less difficulty.   Baseline: 3-4x/hour  Goal Status: INITIAL  10. Pt will decrease frequency of nightly trips to the bathroom to 1 or less in order to get restful sleep.   Baseline: every 2 hours  Goal status:  INITIAL  PLAN:  PT FREQUENCY: 1-2x/week  PT DURATION: 16 weeks    PLANNED INTERVENTIONS: 97164- PT Re-evaluation, 97750- Physical Performance Testing, 97110-Therapeutic exercises, 97530- Therapeutic activity, [id number]- Neuromuscular re-education, 97535- Self Care, [id number]- Manual therapy, [phone]- Aquatic Therapy, Patient/Family education, Taping, Joint mobilization, Cryotherapy, and Moist heat.  PLAN  FOR NEXT SESSION:  KT tape; pelvic mobility ex's;  seated ball ex's; low level core; monitor BP and symptom response  Pelvic floor: splinting techniques to help with constipation; revisit what midwife says about fiber/magnesium; core training with use of small and medium stability ball, quadruped positions; go over pelvic stability belt for support and pain reduction   Glade Pesa, PT 09/13/2024 5:25 PM Phone: [phone] Fax: [phone]  [facility] [address] [place], [place] [address] Phone # [phone] Fax [phone]

## 2024-09-14 ENCOUNTER — Ambulatory Visit: Payer: Self-pay | Admitting: Advanced Practice Midwife

## 2024-09-14 LAB — CBC
Hematocrit: 27.9 % — ABNORMAL LOW (ref 34.0–46.6)
Hemoglobin: 8.2 g/dL — ABNORMAL LOW (ref 11.1–15.9)
MCH: 21.9 pg — ABNORMAL LOW (ref 26.6–33.0)
MCHC: 29.4 g/dL — ABNORMAL LOW (ref 31.5–35.7)
MCV: 74 fL — ABNORMAL LOW (ref 79–97)
NRBC: 1 % — ABNORMAL HIGH (ref 0–0)
Platelets: 190 x10E3/uL (ref 150–450)
RBC: 3.75 x10E6/uL — ABNORMAL LOW (ref 3.77–5.28)
RDW: 15.1 % (ref 11.7–15.4)
WBC: 7.8 x10E3/uL (ref 3.4–10.8)

## 2024-09-14 LAB — COMPREHENSIVE METABOLIC PANEL WITH GFR
ALT: 12 IU/L (ref 0–32)
AST: 22 IU/L (ref 0–40)
Albumin: 3.6 g/dL — ABNORMAL LOW (ref 4.0–5.0)
Alkaline Phosphatase: 61 IU/L (ref 41–116)
BUN/Creatinine Ratio: 15 (ref 9–23)
BUN: 9 mg/dL (ref 6–20)
Bilirubin Total: 0.3 mg/dL (ref 0.0–1.2)
CO2: 21 mmol/L (ref 20–29)
Calcium: 9 mg/dL (ref 8.7–10.2)
Chloride: 101 mmol/L (ref 96–106)
Creatinine, Ser: 0.6 mg/dL (ref 0.57–1.00)
Globulin, Total: 3.2 g/dL (ref 1.5–4.5)
Glucose: 66 mg/dL — ABNORMAL LOW (ref 70–99)
Potassium: 3.7 mmol/L (ref 3.5–5.2)
Sodium: 135 mmol/L (ref 134–144)
Total Protein: 6.8 g/dL (ref 6.0–8.5)
eGFR: 128 mL/min/1.73 (ref >59)

## 2024-09-14 LAB — PROTEIN / CREATININE RATIO, URINE
Creatinine, Urine: 92.2 mg/dL
Protein, Ur: 15.1 mg/dL
Protein/Creat Ratio: 164 mg/g{creat} (ref 0–200)

## 2024-09-15 ENCOUNTER — Other Ambulatory Visit: Payer: Self-pay

## 2024-09-16 ENCOUNTER — Encounter: Payer: Self-pay | Admitting: General Practice

## 2024-09-16 ENCOUNTER — Ambulatory Visit: Payer: Self-pay

## 2024-09-16 ENCOUNTER — Other Ambulatory Visit

## 2024-09-16 ENCOUNTER — Other Ambulatory Visit: Payer: Self-pay

## 2024-09-16 VITALS — BP 130/88 | HR 93 | Ht 62.0 in | Wt 158.0 lb

## 2024-09-16 DIAGNOSIS — O10919 Unspecified pre-existing hypertension complicating pregnancy, unspecified trimester: Secondary | ICD-10-CM

## 2024-09-16 DIAGNOSIS — R898 Other abnormal findings in specimens from other organs, systems and tissues: Secondary | ICD-10-CM

## 2024-09-16 DIAGNOSIS — O99011 Anemia complicating pregnancy, first trimester: Secondary | ICD-10-CM

## 2024-09-16 DIAGNOSIS — J45909 Unspecified asthma, uncomplicated: Secondary | ICD-10-CM

## 2024-09-16 DIAGNOSIS — O0992 Supervision of high risk pregnancy, unspecified, second trimester: Secondary | ICD-10-CM

## 2024-09-16 DIAGNOSIS — O0990 Supervision of high risk pregnancy, unspecified, unspecified trimester: Secondary | ICD-10-CM

## 2024-09-16 DIAGNOSIS — Z0130 Encounter for examination of blood pressure without abnormal findings: Secondary | ICD-10-CM

## 2024-09-16 DIAGNOSIS — R7689 Other specified abnormal immunological findings in serum: Secondary | ICD-10-CM

## 2024-09-16 MED ORDER — ALBUTEROL SULFATE (2.5 MG/3ML) 0.083% IN NEBU: 2.5000 mg | INHALATION_SOLUTION | Freq: Four times a day (QID) | RESPIRATORY_TRACT | 12 refills | Status: AC | PRN

## 2024-09-16 MED ORDER — BUDESONIDE-FORMOTEROL FUMARATE 160-4.5 MCG/ACT IN AERO: 2.0000 | INHALATION_SPRAY | Freq: Two times a day (BID) | RESPIRATORY_TRACT | 3 refills | Status: AC

## 2024-09-16 MED ORDER — MONTELUKAST SODIUM 10 MG PO TABS: 10.0000 mg | ORAL_TABLET | Freq: Every day | ORAL | 4 refills | Status: AC

## 2024-09-16 NOTE — Progress Notes (Signed)
 Patient presents to office today for BP check following up from OB visit 2 days ago. She has a history of CHTN and was taking nifedipine  30mg  daily but was switched to labetalol 400mg  BID. She has not had a headache since her last visit 2 days ago. No dizziness or blurry vision either. BP 130/88. BP cuff for home use given in office today. Patient will return for [facility] care as previously scheduled.   Elenor DEL RN BSN 09/16/2024

## 2024-09-16 NOTE — Addendum Note (Signed)
 Addended by: INOCENTE ELENOR CROME on: 09/16/2024 10:41 AM   Modules accepted: Orders

## 2024-09-16 NOTE — Addendum Note (Signed)
 Addended by: INOCENTE ELENOR CROME on: 09/16/2024 10:35 AM   Modules accepted: Orders

## 2024-09-19 ENCOUNTER — Encounter: Payer: Self-pay | Admitting: Family Medicine

## 2024-09-19 LAB — VITAMIN B12: Vitamin B-12: 328 pg/mL (ref 232–1245)

## 2024-09-19 LAB — IRON,TIBC AND FERRITIN PANEL
Ferritin: 8 ng/mL — ABNORMAL LOW (ref 15–150)
Iron Saturation: 5 % — CL (ref 15–55)
Iron: 21 ug/dL — ABNORMAL LOW (ref 27–159)
Total Iron Binding Capacity: 448 ug/dL (ref 250–450)
UIBC: 427 ug/dL — ABNORMAL HIGH (ref 131–425)

## 2024-09-19 LAB — GLUCOSE TOLERANCE, 2 HOURS W/ 1HR
Glucose, 1 hour: 122 mg/dL (ref 70–179)
Glucose, 2 hour: 76 mg/dL (ref 70–152)
Glucose, Fasting: 71 mg/dL (ref 70–91)

## 2024-09-19 LAB — CBC
Hematocrit: 27.1 % — ABNORMAL LOW (ref 34.0–46.6)
Hemoglobin: 7.9 g/dL — ABNORMAL LOW (ref 11.1–15.9)
MCH: 21.2 pg — ABNORMAL LOW (ref 26.6–33.0)
MCHC: 29.2 g/dL — ABNORMAL LOW (ref 31.5–35.7)
MCV: 73 fL — ABNORMAL LOW (ref 79–97)
NRBC: 1 % — ABNORMAL HIGH (ref 0–0)
Platelets: 210 x10E3/uL (ref 150–450)
RBC: 3.72 x10E6/uL — ABNORMAL LOW (ref 3.77–5.28)
RDW: 15.4 % (ref 11.7–15.4)
WBC: 5.6 x10E3/uL (ref 3.4–10.8)

## 2024-09-19 LAB — RPR, QUANT+TP ABS (REFLEX)
Rapid Plasma Reagin, Quant: 1:2 {titer} — ABNORMAL HIGH
T Pallidum Abs: NONREACTIVE

## 2024-09-19 LAB — SYPHILIS: RPR W/REFLEX TO RPR TITER AND TREPONEMAL ANTIBODIES, TRADITIONAL SCREENING AND DIAGNOSIS ALGORITHM: RPR Ser Ql: REACTIVE — AB

## 2024-09-19 LAB — HIV ANTIBODY (ROUTINE TESTING W REFLEX): HIV Screen 4th Generation wRfx: NONREACTIVE

## 2024-09-19 LAB — ANTIBODY SCREEN: Antibody Screen: NEGATIVE

## 2024-09-20 ENCOUNTER — Ambulatory Visit

## 2024-09-20 DIAGNOSIS — M6281 Muscle weakness (generalized): Secondary | ICD-10-CM

## 2024-09-20 DIAGNOSIS — M5459 Other low back pain: Secondary | ICD-10-CM

## 2024-09-20 NOTE — Therapy (Signed)
 " OUTPATIENT PHYSICAL THERAPY THORACOLUMBAR TREATMENT   Patient Name: Laura [name] MRN: [id number] DOB:11-30-2000, 23 y.o., female Today's Date: 09/20/2024  END OF SESSION:  PT End of Session - 09/20/24 1221     Visit Number 6    Date for Recertification  12/06/24    Authorization Type 2 insurances: [employer]HC/ [facility] Medicaid 16 visits 11/18-3/10    Authorization - Visit Number 5    Authorization - Number of Visits 16    PT Start Time 1151    PT Stop Time 1226    PT Time Calculation (min) 35 min    Activity Tolerance Patient tolerated treatment well    Behavior During Therapy [facility] for tasks assessed/performed            Past Medical History:  Diagnosis Date   Anemia    Anxiety    Asthma    Chlamydia    Depression    Genital herpes    Gonorrhea    Leukopenia 01/07/2021   PID (acute pelvic inflammatory disease)    TOA (tubo-ovarian abscess) 12/28/2023   Past Surgical History:  Procedure Laterality Date   OVARIAN CYST REMOVAL Left 2020   Patient Active Problem List   Diagnosis Date Noted   Iron deficiency anemia, unspecified 09/12/2024   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 07/29/2024   Chronic hypertension affecting pregnancy 07/19/2024   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 06/03/2024   Anemia affecting pregnancy in second trimester 05/26/2024   Nausea and vomiting during pregnancy 05/26/2024   Supervision of high risk pregnancy, antepartum, second trimester 05/17/2024   Biological false positive RPR test 12/30/2023   Depressive disorder 03/15/2023   GAD (generalized anxiety disorder) 05/13/2022   PTSD (post-traumatic stress disorder) 05/13/2022   Asthma affecting pregnancy in second trimester 08/15/2019    PCP: [facility]  REFERRING PROVIDER: Milly Planas CNM  REFERRING DIAG: O99.891, M54.9 back pain affecting pregnancy in 2nd trimester  Rationale for Evaluation and Treatment: Rehabilitation  THERAPY DIAG:  Back pain  during pregnancy ONSET DATE: 2019, worsening during current pregancy  SUBJECTIVE:                                                                                                                                                                                           SUBJECTIVE STATEMENT: 60% better since the start of care. My belly feels tight today  Due date 3/23  High risk pregnancy: BP (monitoring for pre-eclampsia and pelvic pain) Has 3, 4 and 6 year olds at home   PERTINENT HISTORY:  Chronic HTN; asthma; generalized anxiety disorder; PTSD  PAIN: 09/20/24  Are you having  pain? 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 No   OCCUPATION: unable to work at [employer] secondary to unable to provide accomodations  PLOF: Independent  PATIENT GOALS: help with back pain (never has done PT)  OBJECTIVE:  Note: Objective measures were completed at Evaluation unless otherwise noted. 08/18/24: UIQ-7: 81               External Perineal Exam: gaping with visibly anterior/posterior vaginal walls present                             Internal Pelvic Floor: no tenderness  Patient confirms identification and approves PT to assess internal pelvic floor and treatment Yes All internal or external pelvic floor assessments and/or treatments are completed with proper hand hygiene and gloves hands. If needed gloves are changed with hand hygiene during patient care time.  PELVIC MMT:   MMT eval  Vaginal 1/5, 2 second hold, 6 repeat contractions  (Blank rows = not tested)         TONE: WNL  PROLAPSE: Grade 2 anterior/posterior vaginal wall laxity    EVAL: PATIENT SURVEYS:      Modified Oswestry:  MODIFIED OSWESTRY DISABILITY SCALE  Date: 11/18 Score  Pain intensity 3 =  Pain medication provides me with moderate relief from pain.  2. Personal care (washing, dressing, etc.) 1 =  I can take care of myself normally, but it increases my pain.  3. Lifting 4 = I can lift only very light weights  4. Walking 2 =  Pain prevents me from walking more than  mile.  5. Sitting 1 =  I can only sit in my favorite chair as long as I like.  6. Standing 2 =  Pain prevents me from standing more than 1 hour  7. Sleeping 1 = I can sleep well only by using pain medication.  8. Social Life 2 = Pain prevents me from participating in more energetic activities (eg. sports, dancing).  9. Traveling 0 =  I can travel anywhere without increased pain.  10. Employment/ Homemaking 1 = My normal homemaking/job activities increase my pain, but I can still perform all that is required of me  Total 34%   Interpretation of scores: Score Category Description  0-20% Minimal Disability The patient can cope with most living activities. Usually no treatment is indicated apart from advice on lifting, sitting and exercise  21-40% Moderate Disability The patient experiences more pain and difficulty with sitting, lifting and standing. Travel and social life are more difficult and they may be disabled from work. Personal care, sexual activity and sleeping are not grossly affected, and the patient can usually be managed by conservative means  41-60% Severe Disability Pain remains the main problem in this group, but activities of daily living are affected. These patients require a detailed investigation  61-80% Crippled Back pain impinges on all aspects of the patients life. Positive intervention is required  81-100% Bed-bound These patients are either bed-bound or exaggerating their symptoms  Bluford BRAVO,  Scantlebury A, Booth A, et al. Surgery versus conservative management of stable thoracolumbar fracture: the PRESTO feasibility RCT. [place] ([place]): [employer]; 2021 Nov. [facility] Technology Assessment, No. 25.62.) Appendix 3, Oswestry Disability Index category descriptors. Available from: Findjewelers.cz  Minimally Clinically Important Difference (MCID) = 12.8%  COGNITION: Overall cognitive status: Within functional limits for tasks assessed      PALPATION: Painful right SI joint line  LUMBAR ROM: pt avoids lumbar flexion, extension and side bending WFLs; decreased lumbofascial mobility with quadruped cat/cow and childs pose   LOWER EXTREMITY ROM:   WFLs  LOWER EXTREMITY MMT:  decreased hip abduction strength bil 4/5  FUNCTIONAL TESTS:  Able to rise sit to stand without UE use  GAIT: Comments: WFLs  TREATMENT DATE:   09/20/24: Supine Propped on wedge:   Lumbar rotation Rt and Lt x10 each Iso press into yoga block- opposite arm/leg 5 hold x10 each  ball squeeze with TA activation and breathe out on activation 5x10,  hip abduction with red heavy loop x10 Sidelying:  Foam roll forward and back for hip protrusion/retraction 10x right/eft Open books right/left 10x  Clam  10x right/left Seated:   Hamstring stretch 3x20 seconds   Seated on ball: pelvic rocking: side to side, anterior/poserior and circles bil  Manual: KT  H  pattern over lumbo sacral region (pt requests it to be tighter) skin has done fine so far  09/13/24: Supine Propped on wedge:   Lumbar rotation discomfort in right low back Piriformis: tender with achieving position  ball squeeze with TA activation and breathe out on activation 5x10,  hip abduction with red heavy loop x10 Sidelying:  Flexion/rotation lumbar stretch right side Foam roll forward and back for hip protrusion/retraction 10x right/eft Open books right/left 10x  Clam  10x right/left Quadruped:  cat/cow, rocking forward and back hip shifting 8-10x right/left  KT  H  pattern over lumbo sacral region (pt requests it to be tighter) skin has done fine so far  09/06/24: Propped on wedge:   ball squeeze with TA activation and breathe out on activation 5x10,  hip abduction with pink heavy loop x10 Sidelying:  reverse clam with ball between knees 10x,  UE push down on ball with coordinated exhale 10x,  hip abduction 10x right/left Quadruped with yoga block under knee: cat/cow, rocking forward and back hip shifting 8-10x right/left  KT  H  pattern over lumbo sacral region: advised on removal in 3 days to check skin tolerance       PATIENT EDUCATION:  Education details: Educated patient on anatomy and physiology of current symptoms, prognosis, plan of care as well as initial self care strategies to promote recovery Person educated: Patient Education method: Explanation Education comprehension: verbalized understanding  HOME EXERCISE PROGRAM: [id number]  ASSESSMENT:  CLINICAL IMPRESSION: Bevely reports much improved back pain, 60% overall improvement.  She has the most pain with standing long periods and with position changes in bed. She responds well to KT tape and this was applied again today. Therapist closely monitoring response and ensuring intensity remains low secondary to ongoing high blood pressure.  Patient will benefit from skilled PT to address the below impairments and improve overall function.    OBJECTIVE IMPAIRMENTS: decreased activity tolerance, decreased mobility, difficulty walking, decreased strength, impaired perceived functional ability, and pain.   ACTIVITY LIMITATIONS: carrying, lifting, bending, standing, squatting, locomotion level, and caring for others  PARTICIPATION LIMITATIONS: meal prep, cleaning, laundry, interpersonal relationship, and occupation  PERSONAL FACTORS: Time since onset  of injury/illness/exacerbation and 3+ comorbidities: HTN during  pregnancy, asthma, anxiety are also affecting patient's functional outcome.   REHAB POTENTIAL: Good  CLINICAL DECISION MAKING: Evolving/moderate complexity  EVALUATION COMPLEXITY: Moderate   GOALS: Goals reviewed with patient? Yes  SHORT TERM GOALS: Target date: 09/27/2024    The patient will demonstrate knowledge of basic self care strategies and exercises  Baseline:09/20/24 Goal status: MET   2.  The patient will have trunk and LE strength and mobility needed to bend forward needed to take care of her young children Baseline:  Goal status: met 12/16 3.  The patient will report a 30% improvement in pain levels with functional activities which are currently difficult including bending and turning Baseline: 60% better  Goal status: 12/16    LONG TERM GOALS: Target date: 12/06/2024    The patient will be independent in a safe self progression of a home exercise program to promote further recovery of function  Baseline:  Goal status: INITIAL  2.  The patient will report a 60% improvement in pain levels with functional activities which are currently difficult including bending and turning Baseline: 60% with standing (09/20/24) Goal status: In progress   3.  The patient will have improved hip strength to at least 4+/5 needed for standing, walking longer distances  Baseline:  Goal status: INITIAL  4.  The patient will have improved trunk flexor and extensor muscle strength needed for lifting her newborn  Baseline:  Goal status: INITIAL  5.  Modified Oswestry functional outcome measure score improved to   26  % indicating improved function with ADLS with less pain.   Baseline:  Goal status: INITIAL  6. Pt will improve UIQ-7 score to less than 50 in order to demonstrate improved activity tolerance and less impact of bladder control on functional activities.   Baseline: 81  Goal Status: INITIAL  7. Pt will demonstrate normal pelvic floor muscle tone and A/ROM, able to  achieve 3/5 strength with contractions and 10 sec endurance, in order to reduce urinary leaking and number of pads patient wears.   Baseline:  Goal Status: INITIAL  8. Pt will reduce pad use to no more than 1 a day in order to decrease monetary impact and risk of infection.   Baseline: 3 a day  Goal status: INITIAL  9. Pt will be able to go 2-3 hours in between voids without urgency or incontinence in order to improve QOL and perform all functional activities with less difficulty.   Baseline: 3-4x/hour  Goal Status: INITIAL  10. Pt will decrease frequency of nightly trips to the bathroom to 1 or less in order to get restful sleep.   Baseline: every 2 hours  Goal status: INITIAL  PLAN:  PT FREQUENCY: 1-2x/week  PT DURATION: 16 weeks    PLANNED INTERVENTIONS: 97164- PT Re-evaluation, 97750- Physical Performance Testing, 97110-Therapeutic exercises, 97530- Therapeutic activity, [id number]- Neuromuscular re-education, 97535- Self Care, 02859- Manual therapy, [phone]- Aquatic Therapy, Patient/Family education, Taping, Joint mobilization, Cryotherapy, and Moist heat.  PLAN FOR NEXT SESSION:  KT tape; pelvic mobility ex's;  seated ball ex's; low level core; monitor BP and symptom response  Pelvic floor: splinting techniques to help with constipation; revisit what midwife says about fiber/magnesium; core training with use of small and medium stability ball, quadruped positions; go over pelvic stability belt for support and pain reduction   Burnard Joy, PT 09/20/2024 12:35 PM   [facility] [address], [address] [place], [place] [address] Phone # [phone]  Fax [phone]      "

## 2024-09-26 ENCOUNTER — Inpatient Hospital Stay (HOSPITAL_COMMUNITY)
Admission: AD | Admit: 2024-09-26 | Discharge: 2024-09-26 | Disposition: A | Discharge status: Home / Self Care | Attending: Obstetrics and Gynecology | Admitting: Obstetrics and Gynecology

## 2024-09-26 ENCOUNTER — Encounter (HOSPITAL_COMMUNITY): Payer: Self-pay | Admitting: Student

## 2024-09-26 ENCOUNTER — Other Ambulatory Visit: Payer: Self-pay

## 2024-09-26 ENCOUNTER — Encounter (HOSPITAL_COMMUNITY)
Admission: RE | Admit: 2024-09-26 | Discharge: 2024-09-26 | Disposition: A | Source: Ambulatory Visit | Attending: Student

## 2024-09-26 ENCOUNTER — Encounter (HOSPITAL_COMMUNITY): Payer: Self-pay | Admitting: Obstetrics and Gynecology

## 2024-09-26 VITALS — BP 136/97 | HR 98 | Temp 98.5°F | Resp 18

## 2024-09-26 DIAGNOSIS — Z3A28 28 weeks gestation of pregnancy: Secondary | ICD-10-CM | POA: Diagnosis not present

## 2024-09-26 DIAGNOSIS — D649 Anemia, unspecified: Secondary | ICD-10-CM | POA: Diagnosis not present

## 2024-09-26 DIAGNOSIS — R030 Elevated blood-pressure reading, without diagnosis of hypertension: Secondary | ICD-10-CM | POA: Insufficient documentation

## 2024-09-26 DIAGNOSIS — O99012 Anemia complicating pregnancy, second trimester: Secondary | ICD-10-CM | POA: Insufficient documentation

## 2024-09-26 DIAGNOSIS — O99013 Anemia complicating pregnancy, third trimester: Secondary | ICD-10-CM | POA: Diagnosis not present

## 2024-09-26 DIAGNOSIS — R519 Headache, unspecified: Secondary | ICD-10-CM

## 2024-09-26 DIAGNOSIS — O26893 Other specified pregnancy related conditions, third trimester: Secondary | ICD-10-CM

## 2024-09-26 DIAGNOSIS — D509 Iron deficiency anemia, unspecified: Secondary | ICD-10-CM | POA: Insufficient documentation

## 2024-09-26 LAB — COMPREHENSIVE METABOLIC PANEL WITH GFR
ALT: 10 U/L (ref 0–44)
AST: 24 U/L (ref 15–41)
Albumin: 3.6 g/dL (ref 3.5–5.0)
Alkaline Phosphatase: 67 U/L (ref 38–126)
Anion gap: 10 (ref 5–15)
BUN: 8 mg/dL (ref 6–20)
CO2: 21 mmol/L — ABNORMAL LOW (ref 22–32)
Calcium: 9.2 mg/dL (ref 8.9–10.3)
Chloride: 102 mmol/L (ref 98–111)
Creatinine, Ser: 0.53 mg/dL (ref 0.44–1.00)
GFR, Estimated: >60 mL/min
Glucose, Bld: 77 mg/dL (ref 70–99)
Potassium: 3.6 mmol/L (ref 3.5–5.1)
Sodium: 133 mmol/L — ABNORMAL LOW (ref 135–145)
Total Bilirubin: 0.2 mg/dL (ref 0.0–1.2)
Total Protein: 7 g/dL (ref 6.5–8.1)

## 2024-09-26 LAB — PROTEIN / CREATININE RATIO, URINE
Creatinine, Urine: 42 mg/dL
Protein Creatinine Ratio: 0.2 mg/mg — ABNORMAL HIGH
Total Protein, Urine: 9 mg/dL

## 2024-09-26 LAB — URINALYSIS, ROUTINE W REFLEX MICROSCOPIC
Bilirubin Urine: NEGATIVE
Glucose, UA: NEGATIVE mg/dL
Hgb urine dipstick: NEGATIVE
Ketones, ur: NEGATIVE mg/dL
Nitrite: NEGATIVE
Protein, ur: NEGATIVE mg/dL
Specific Gravity, Urine: 1.009 (ref 1.005–1.030)
pH: 7 (ref 5.0–8.0)

## 2024-09-26 LAB — CBC
HCT: 25.3 % — ABNORMAL LOW (ref 36.0–46.0)
Hemoglobin: 7.6 g/dL — ABNORMAL LOW (ref 12.0–15.0)
MCH: 21.3 pg — ABNORMAL LOW (ref 26.0–34.0)
MCHC: 30 g/dL (ref 30.0–36.0)
MCV: 71.1 fL — ABNORMAL LOW (ref 80.0–100.0)
Platelets: 184 K/uL (ref 150–400)
RBC: 3.56 MIL/uL — ABNORMAL LOW (ref 3.87–5.11)
RDW: 15.3 % (ref 11.5–15.5)
WBC: 6.7 K/uL (ref 4.0–10.5)
nRBC: 1.1 % — ABNORMAL HIGH (ref 0.0–0.2)

## 2024-09-26 MED ORDER — SODIUM CHLORIDE 0.9 % IV BOLUS
1000.0000 mL | Freq: Once | INTRAVENOUS | Status: AC
  Administered 2024-09-26: 1000 mL via INTRAVENOUS

## 2024-09-26 MED ORDER — DIPHENHYDRAMINE HCL 50 MG/ML IJ SOLN
25.0000 mg | INTRAMUSCULAR | Status: AC
  Administered 2024-09-26: 25 mg via INTRAVENOUS
  Filled 2024-09-26: qty 1

## 2024-09-26 MED ORDER — PROCHLORPERAZINE EDISYLATE 10 MG/2ML IJ SOLN
10.0000 mg | Freq: Once | INTRAMUSCULAR | Status: AC
  Administered 2024-09-26: 10 mg via INTRAVENOUS
  Filled 2024-09-26: qty 2

## 2024-09-26 MED ORDER — IRON SUCROSE 200 MG IVPB - SIMPLE MED
200.0000 mg | Freq: Once | Status: AC
  Administered 2024-09-26: 200 mg via INTRAVENOUS
  Filled 2024-09-26: qty 200

## 2024-09-26 NOTE — Discharge Instructions (Signed)
    [facility]     [facility]                    [facility]                 [facility]    **Purchase ONE of these store-brand Excedrin Tension Headache medications. Take it as directed for relief of headache. DO NOT TAKE AT THE SAME TIME AS TYLENOL /ACETAMINOPHEN **

## 2024-09-26 NOTE — MAU Provider Note (Signed)
 " History     CSN: [id number]  Arrival date and time: 09/26/24 1124   Event Date/Time   First Provider Initiated Contact with Patient 09/26/24 1224      Chief Complaint  Patient presents with   Headache   Hypertension   HPI [name] is a 23 y.o. year old [id number] female at [redacted]w[redacted]d weeks gestation who presents to MAU reporting headache and increased blood pressure since yesterday.  She reports her blood pressure was 141/93 while in the infusion center this morning at her appointment.  She describes the headache as being her right temporal.  She also has photosensitivity this morning where she was unable to open her eye.  She rates the pain 7/10.  She took Excedrin tension headache 2 caplets at 730 this morning.  She reports a decrease in headache some says she could go to her appointment at the iron  infusion center but is starting to increase again.  She called her OB office and was advised to come to MAU after her iron  infusion appointment.  She reports positive fetal movement.  She receives prenatal care at [facility]; next appointment is 10/11/2024.   OB History     Gravida  4   Para  3   Term  3   Preterm  0   AB  0   Living  3      SAB  0   IAB  0   Ectopic  0   Multiple  0   Live Births  3           Past Medical History:  Diagnosis Date   Anemia    Anxiety    Asthma    Chlamydia    Depression    Genital herpes    Gonorrhea    Leukopenia 01/07/2021   PID (acute pelvic inflammatory disease)    TOA (tubo-ovarian abscess) 12/28/2023    Past Surgical History:  Procedure Laterality Date   OVARIAN CYST REMOVAL Left 2020    Family History  Problem Relation Age of Onset   Asthma Mother    Epilepsy Father    Arthritis Maternal Grandmother    Cancer Maternal Grandfather    Diabetes Maternal Grandfather    Kidney disease Maternal Grandfather    Diabetes Maternal Aunt    Anxiety disorder Paternal Aunt    Depression Paternal Aunt     Bleeding Disorder Paternal Aunt     Social History[1]  Allergies: Allergies[2]  No medications prior to admission.    Review of Systems  Constitutional: Negative.   HENT: Negative.    Eyes:  Positive for photophobia.  Cardiovascular: Negative.   Gastrointestinal: Negative.   Endocrine: Negative.   Genitourinary: Negative.   Musculoskeletal: Negative.   Skin: Negative.   Allergic/Immunologic: Negative.   Neurological:  Positive for headaches.  Hematological: Negative.   Psychiatric/Behavioral: Negative.     Physical Exam   Patient Vitals for the past 24 hrs:  BP Temp Temp src Pulse Resp SpO2  09/26/24 1514 137/89 -- -- 100 -- --  09/26/24 1445 -- -- -- 98 -- 99 %  09/26/24 1430 124/70 -- -- (!) 105 -- 96 %  09/26/24 1400 122/71 -- -- (!) 102 -- 97 %  09/26/24 1345 129/74 -- -- (!) 108 -- 96 %  09/26/24 1330 123/81 -- -- 98 -- 96 %  09/26/24 1315 136/85 -- -- (!) 106 -- 98 %  09/26/24 1245 126/86 -- -- 99 -- 98 %  09/26/24 1231 130/85 -- -- 98 -- 97 %  09/26/24 1215 135/87 -- -- 100 -- 97 %  09/26/24 1200 (!) 127/90 98.2 F (36.8 C) Oral 99 17 99 %    Physical Exam Vitals and nursing note reviewed.  Constitutional:      Appearance: Normal appearance. She is normal weight.  HENT:     Head: Normocephalic and atraumatic.  Cardiovascular:     Rate and Rhythm: Tachycardia present.  Pulmonary:     Effort: Pulmonary effort is normal.  Abdominal:     Comments: gravid  Musculoskeletal:        General: Normal range of motion.  Skin:    General: Skin is warm and dry.  Neurological:     Mental Status: She is alert and oriented to person, place, and time.  Psychiatric:        Mood and Affect: Mood normal.        Behavior: Behavior normal.        Thought Content: Thought content normal.        Judgment: Judgment normal.   REACTIVE NST - FHR: 140 bpm / moderate variability / accels present / decels absent / TOCO: none  Reassessment @ 1511: H/A completely resolved.  Pain rated 0/10.   MAU Course  Procedures  MDM CCUA CBC CMP P/C Ratio Serial BP's  IVFs: 0.9% NS 1000 ml @ 999 ml/hr Benadryl  25 mg IVP Compazine  10 mg IVP   Results for orders placed or performed during the hospital encounter of 09/26/24 (from the past 24 hours)  Urinalysis, Routine w reflex microscopic -Urine, Clean Catch     Status: Abnormal   Collection Time: 09/26/24 11:45 AM  Result Value Ref Range   Color, Urine YELLOW YELLOW   APPearance HAZY (A) CLEAR   Specific Gravity, Urine 1.009 1.005 - 1.030   pH 7.0 5.0 - 8.0   Glucose, UA NEGATIVE NEGATIVE mg/dL   Hgb urine dipstick NEGATIVE NEGATIVE   Bilirubin Urine NEGATIVE NEGATIVE   Ketones, ur NEGATIVE NEGATIVE mg/dL   Protein, ur NEGATIVE NEGATIVE mg/dL   Nitrite NEGATIVE NEGATIVE   Leukocytes,Ua TRACE (A) NEGATIVE   RBC / HPF 0-5 0 - 5 RBC/hpf   WBC, UA 0-5 0 - 5 WBC/hpf   Bacteria, UA RARE (A) NONE SEEN   Squamous Epithelial / HPF 11-20 0 - 5 /HPF  Protein / creatinine ratio, urine     Status: Abnormal   Collection Time: 09/26/24 11:45 AM  Result Value Ref Range   Creatinine, Urine 42 mg/dL   Total Protein, Urine 9 mg/dL   Protein Creatinine Ratio 0.2 (H) <0.2 mg/mg  CBC     Status: Abnormal   Collection Time: 09/26/24  1:03 PM  Result Value Ref Range   WBC 6.7 4.0 - 10.5 K/uL   RBC 3.56 (L) 3.87 - 5.11 MIL/uL   Hemoglobin 7.6 (L) 12.0 - 15.0 g/dL   HCT 74.6 (L) 63.9 - 53.9 %   MCV 71.1 (L) 80.0 - 100.0 fL   MCH 21.3 (L) 26.0 - 34.0 pg   MCHC 30.0 30.0 - 36.0 g/dL   RDW 84.6 88.4 - 84.4 %   Platelets 184 150 - 400 K/uL   nRBC 1.1 (H) 0.0 - 0.2 %  Comprehensive metabolic panel with GFR     Status: Abnormal   Collection Time: 09/26/24  1:03 PM  Result Value Ref Range   Sodium 133 (L) 135 - 145 mmol/L   Potassium 3.6 3.5 - 5.1  mmol/L   Chloride 102 98 - 111 mmol/L   CO2 21 (L) 22 - 32 mmol/L   Glucose, Bld 77 70 - 99 mg/dL   BUN 8 6 - 20 mg/dL   Creatinine, Ser 9.46 0.44 - 1.00 mg/dL   Calcium 9.2  8.9 - 89.6 mg/dL   Total Protein 7.0 6.5 - 8.1 g/dL   Albumin 3.6 3.5 - 5.0 g/dL   AST 24 15 - 41 U/L   ALT 10 0 - 44 U/L   Alkaline Phosphatase 67 38 - 126 U/L   Total Bilirubin 0.2 0.0 - 1.2 mg/dL   GFR, Estimated >39 >39 mL/min   Anion gap 10 5 - 15     Assessment and Plan  1. Headache in pregnancy, antepartum, third trimester (Primary) - Resolved  2. Elevated blood pressure reading without diagnosis of hypertension - PEC WNL >> P/C ratio stable compared to when last done  3. [redacted] weeks gestation of pregnancy   - Discharge home - Keep scheduled appt with [facility] on 10/11/2024 - Patient verbalized an understanding of the plan of care and agrees.   Ala Cart, CNM 09/26/2024, 12:24 PM      [1]  Social History Tobacco Use   Smoking status: Never   Smokeless tobacco: Never  Vaping Use   Vaping status: Former  Substance Use Topics   Alcohol use: Not Currently    Alcohol/week: 8.0 - 10.0 standard drinks of alcohol    Types: 8 - 10 Shots of liquor per week    Comment: every other weekend   Drug use: Not Currently    Types: Marijuana    Comment: 2018  [2] No Known Allergies  "

## 2024-09-26 NOTE — MAU Note (Signed)
.  Laura [name] is a 23 y.o. at [redacted]w[redacted]d here in MAU reporting: headache and high blood pressure that started yesterday was at iron  infusion today bp still elevated 140's /90's headache is 7/10 +FM Onset of complaint: yesterday  Pain score: 7/10 There were no vitals filed for this visit.   (Will get in room-by passed triage due to other patient in triage   Lab orders placed from triage:   UA

## 2024-09-27 ENCOUNTER — Ambulatory Visit: Admitting: Physical Therapy

## 2024-09-27 ENCOUNTER — Encounter: Payer: Self-pay | Admitting: Physical Therapy

## 2024-09-27 ENCOUNTER — Telehealth: Payer: Self-pay | Admitting: Advanced Practice Midwife

## 2024-09-27 ENCOUNTER — Telehealth: Payer: Self-pay

## 2024-09-27 ENCOUNTER — Ambulatory Visit (INDEPENDENT_AMBULATORY_CARE_PROVIDER_SITE_OTHER): Payer: Self-pay | Admitting: Advanced Practice Midwife

## 2024-09-27 VITALS — BP 141/91 | HR 118 | Wt 161.0 lb

## 2024-09-27 DIAGNOSIS — R519 Headache, unspecified: Secondary | ICD-10-CM | POA: Diagnosis not present

## 2024-09-27 DIAGNOSIS — O0993 Supervision of high risk pregnancy, unspecified, third trimester: Secondary | ICD-10-CM | POA: Diagnosis not present

## 2024-09-27 DIAGNOSIS — O99013 Anemia complicating pregnancy, third trimester: Secondary | ICD-10-CM | POA: Diagnosis not present

## 2024-09-27 DIAGNOSIS — R102 Pelvic and perineal pain unspecified side: Secondary | ICD-10-CM

## 2024-09-27 DIAGNOSIS — Z3A28 28 weeks gestation of pregnancy: Secondary | ICD-10-CM

## 2024-09-27 DIAGNOSIS — M5459 Other low back pain: Secondary | ICD-10-CM | POA: Diagnosis not present

## 2024-09-27 DIAGNOSIS — O26893 Other specified pregnancy related conditions, third trimester: Secondary | ICD-10-CM | POA: Diagnosis not present

## 2024-09-27 DIAGNOSIS — O0992 Supervision of high risk pregnancy, unspecified, second trimester: Secondary | ICD-10-CM

## 2024-09-27 DIAGNOSIS — O10913 Unspecified pre-existing hypertension complicating pregnancy, third trimester: Secondary | ICD-10-CM

## 2024-09-27 DIAGNOSIS — M6281 Muscle weakness (generalized): Secondary | ICD-10-CM

## 2024-09-27 DIAGNOSIS — O99012 Anemia complicating pregnancy, second trimester: Secondary | ICD-10-CM

## 2024-09-27 DIAGNOSIS — O10919 Unspecified pre-existing hypertension complicating pregnancy, unspecified trimester: Secondary | ICD-10-CM

## 2024-09-27 NOTE — Telephone Encounter (Addendum)
 Pt insurance left messages to schedule peer to peer consult regarding pt medical leave.  I returned call today at [phone] to Janice Vivar.  Ms. Vivar asked about availability to schedule consult with provider.  I am available next Tuesday, Jan 6 and I listed available times for scheduling.  Ms. Vivar is to call back this week to schedule peer to peer consult between our provider and their provider, Dr Kyra Pu.  If I am not available at the times offered, I will follow up with Dr Fredirick or Dr Izell, who are aware of need for this peer to peer consult.    Pt is currently out of work because her job does not allow her to wear a respirator during pregnancy.  The respirator is required to work with chemicals at the job.  Dr Izell, and I have written letters stating that our medical opinion is that the patient can wear the respirator and continue working.  It is my understanding that the employer will not allow the patient to return but will hold the patient's job until after the pregnancy with our medical approval, which I will provide so she can return to work.

## 2024-09-27 NOTE — Progress Notes (Addendum)
 "       PRENATAL VISIT NOTE- Cent[facility] Pregnancy Cycle 24, Session # 4  Subjective:  Laura [name] is a 23 y.o. [id number] at [redacted]w[redacted]d being seen today for ongoing prenatal care through [facility].  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  Patient reports pelvic pressure, intermittent h/a.  Contractions: Irritability. Vag. Bleeding: None.  Movement: Present. Denies leaking of fluid/ROM.   The following portions of the patient's history were reviewed and updated as appropriate: allergies, current medications, past family history, past medical history, past social history, past surgical history and problem list. Problem list updated.  Objective:   Vitals:   09/27/24 0906  BP: (!) 141/91  Pulse: (!) 118  Weight: 161 lb (73 kg)    Fetal Status: Fetal Heart Rate (bpm): 156 Fundal Height: 28 cm Movement: Present     General:  Alert, oriented and cooperative. Patient is in no acute distress.  Skin: Skin is warm and dry. No rash noted.   Cardiovascular: Normal heart rate noted  Respiratory: Normal respiratory effort, no problems with respiration noted  Abdomen: Soft, gravid, appropriate for gestational age.  Pain/Pressure: Absent     Pelvic: Cervical exam deferred        Extremities: Normal range of motion.  Edema: None  Mental Status: Normal mood and affect. Normal behavior. Normal judgment and thought content.    Assessment and Plan:  Pregnancy: G4P3003 at [redacted]w[redacted]d  1. Supervision of high risk pregnancy, antepartum, second trimester (Primary) --Anticipatory guidance about next visits/weeks of pregnancy given.  [facility] Pregnancy, Session#4: Reviewed resources in [employer].   Facilitated discussion today:  Reflection on past/present/future, discussed goals/dreams; Family Planning; Discomforts of third trimester.  Fundal height and FHR appropriate today unless noted otherwise in plan. Patient to continue group care.    2. Chronic hypertension affecting pregnancy --On labetalol 400 mg BID, had MAU visit yesterday for elevated BP 140s/90s at IV infusion visit, was sent to MAU.   --No s/sx of PEC, no h/a today.  PEC labs wnl yesterday. --Increase labetalol to 400 TID today. Discussed with pt who is able to do TID dosing without difficulty.  3. Headache in pregnancy, antepartum, third trimester --Improved with Excedrin Tension --Anemia being treated with IV infusions, likely h/a will improve.   --Warning signs reviewed  4. Anemia affecting pregnancy in second trimester --Hgb 7.6 on 12/29.  IV infusions in progress. --Intermittent h/a, mild fatigue. No other s/sx of anemia.   5. [redacted] weeks gestation of pregnancy   6. Pelvic pain affecting pregnancy in third trimester, antepartum --Improved with PT    Preterm labor symptoms and general obstetric precautions including but not limited to vaginal bleeding, contractions, leaking of fluid and fetal movement were reviewed in detail with the patient. Please refer to After Visit Summary for other counseling recommendations.  No follow-ups on file.  Future Appointments  Date Time Provider Department Center  09/27/2024 12:30 PM Antonetta Glade BROCKS, PT [facility] None  09/28/2024 10:00 AM [facility] None  09/30/2024 10:00 AM [facility] None  10/03/2024 10:00 AM [facility]  None  10/04/2024  7:15 AM [facility] PROVIDER 1 [facility]   10/04/2024  7:30 AM [facility] US5 [facility]  10/04/2024  8:45 AM Simpson, Stacy C, PT [facility] None  10/05/2024 10:00 AM [facility] None  10/11/2024  9:00 AM CENTERING PROVIDER [facility]  10/11/2024 11:45 AM Antonetta Hastings C, PT [facility] None  10/25/2024  9:00 AM CENTERING PROVIDER [facility]  11/08/2024  9:00 AM CENTERING PROVIDER [facility]  11/22/2024  9:00 AM CENTERING PROVIDER [facility]  11/29/2024  9:00 AM CENTERING PROVIDER [facility]  12/06/2024  9:00 AM CENTERING PROVIDER [facility]    Olam Boards, CNM  "

## 2024-09-27 NOTE — Therapy (Signed)
 " OUTPATIENT PHYSICAL THERAPY THORACOLUMBAR TREATMENT   Patient Name: Laura [name] MRN: [id number] DOB:2001/02/14, 23 y.o., female Today's Date: 09/27/2024  END OF SESSION:  PT End of Session - 09/27/24 1230     Visit Number 7    Date for Recertification  12/06/24    Authorization Type 2 insurances: [employer]/ [facility] Medicaid 16 visits 11/18-3/10    Authorization - Visit Number 6    Authorization - Number of Visits 16    PT Start Time 1230    PT Stop Time 1310    PT Time Calculation (min) 40 min    Activity Tolerance Patient tolerated treatment well            Past Medical History:  Diagnosis Date   Anemia    Anxiety    Asthma    Chlamydia    Depression    Genital herpes    Gonorrhea    Leukopenia 01/07/2021   PID (acute pelvic inflammatory disease)    TOA (tubo-ovarian abscess) 12/28/2023   Past Surgical History:  Procedure Laterality Date   OVARIAN CYST REMOVAL Left 2020   Patient Active Problem List   Diagnosis Date Noted   Iron  deficiency anemia, unspecified 09/12/2024   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 07/29/2024   Chronic hypertension affecting pregnancy 07/19/2024   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 06/03/2024   Anemia affecting pregnancy in second trimester 05/26/2024   Nausea and vomiting during pregnancy 05/26/2024   Supervision of high risk pregnancy, antepartum, second trimester 05/17/2024   Biological false positive RPR test 12/30/2023   Depressive disorder 03/15/2023   GAD (generalized anxiety disorder) 05/13/2022   PTSD (post-traumatic stress disorder) 05/13/2022   Asthma affecting pregnancy in second trimester 08/15/2019    PCP: [facility]  REFERRING PROVIDER: Milly Planas CNM  REFERRING DIAG: O99.891, M54.9 back pain affecting pregnancy in 2nd trimester  Rationale for Evaluation and Treatment: Rehabilitation  THERAPY DIAG:  Back pain during pregnancy ONSET DATE: 2019, worsening during current  pregancy  SUBJECTIVE:                                                                                                                                                                                           SUBJECTIVE STATEMENT: Went to maternity ED yesterday. Now on iron  transfusions 3x/week.   BP today 141/92 at the doctor's.  Planning to induce at 37 weeks.  Pressure making it feel like she can't use the bathroom.   Due date 3/23  High risk pregnancy: BP (monitoring for pre-eclampsia and pelvic pain) Has 3, 4 and 6 year olds at home   PERTINENT  HISTORY:  Chronic HTN; asthma; generalized anxiety disorder; PTSD  PAIN: 09/27/24  Are you having pain? 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 No   OCCUPATION: unable to work at [employer] secondary to unable to provide accomodations  PLOF: Independent  PATIENT GOALS: help with back pain (never has done PT)  OBJECTIVE:  Note: Objective measures were completed at Evaluation unless otherwise noted. 08/18/24: UIQ-7: 81               External Perineal Exam: gaping with visibly anterior/posterior vaginal walls present                             Internal Pelvic Floor: no tenderness  Patient confirms identification and approves PT to assess internal pelvic floor and treatment Yes All internal or external pelvic floor assessments and/or treatments are completed with proper hand hygiene and gloves hands. If needed gloves are changed with hand hygiene during patient care time.  PELVIC MMT:   MMT eval   Vaginal 1/5, 2 second hold, 6 repeat contractions  (Blank rows = not tested)        TONE: WNL  PROLAPSE: Grade 2 anterior/posterior vaginal wall laxity    EVAL: PATIENT SURVEYS:      Modified Oswestry:  MODIFIED OSWESTRY DISABILITY SCALE  Date: 11/18 Score  Pain intensity 3 =  Pain medication provides me with moderate relief from pain.  2. Personal care (washing, dressing, etc.) 1 =  I can take care of myself normally, but it increases my pain.  3. Lifting 4 = I can lift only very light weights  4. Walking 2 =  Pain prevents me from walking more than  mile.  5. Sitting 1 =  I can only sit in my favorite chair as long as I like.  6. Standing 2 =  Pain prevents me from standing more than 1 hour  7. Sleeping 1 = I can sleep well only by using pain medication.  8. Social Life 2 = Pain prevents me from participating in more energetic activities (eg. sports, dancing).  9. Traveling 0 =  I can travel anywhere without increased pain.  10. Employment/ Homemaking 1 = My normal homemaking/job activities increase my pain, but I can still perform all that is required of me  Total 34%   Interpretation of scores: Score Category Description  0-20% Minimal Disability The patient can cope with most living activities. Usually no treatment is indicated apart from advice on lifting, sitting and exercise  21-40% Moderate Disability The patient experiences more pain and difficulty with sitting, lifting and standing. Travel and social life are more difficult and they may be disabled from work. Personal care, sexual activity and sleeping are not grossly affected, and the patient can usually be managed by conservative means  41-60% Severe Disability Pain remains the main problem in this group, but activities of daily living are affected. These patients require a detailed investigation  61-80% Crippled Back pain impinges on all aspects of the patients  life. Positive intervention is required  81-100%  Bed-bound These patients are either bed-bound or exaggerating their symptoms  Bluford FORBES Zoe DELENA Karon DELENA, et al. Surgery versus conservative management of stable thoracolumbar fracture: the PRESTO feasibility RCT. [place] ([place]): [employer]; 2021 Nov. [facility] Technology Assessment, No. 25.62.) Appendix 3, Oswestry Disability Index category descriptors. Available from: Findjewelers.cz  Minimally Clinically Important Difference (MCID) = 12.8%  COGNITION: Overall cognitive status: Within functional limits for tasks assessed      PALPATION: Painful right SI joint line  LUMBAR ROM: pt avoids lumbar flexion, extension and side bending WFLs; decreased lumbofascial mobility with quadruped cat/cow and childs pose   LOWER EXTREMITY ROM:   WFLs  LOWER EXTREMITY MMT:  decreased hip abduction strength bil 4/5  FUNCTIONAL TESTS:  Able to rise sit to stand without UE use  GAIT: Comments: WFLs  TREATMENT DATE:  09/27/24: Sidelying:  Foam roll forward and back for hip protrusion/retraction 10x right/eft Open books right/left 10x  Clam  10x right/left Quadruped: pidgeon pose right/left Seated on ball: hip extension and adductor stretching;pelvic rocking: side to side, anterior/poserior and circles bil Seated HS stretch right/left with multiple biases; green ball rollouts 3 ways Standing hip shifting 10x right/left Manual: KT  H  pattern over lumbo sacral region; skin looks good but discussed removal on Friday or sooner if there is any skin irritation 09/20/24: Supine Propped on wedge:   Lumbar rotation Rt and Lt x10 each Iso press into yoga block- opposite arm/leg 5 hold x10 each  ball squeeze with TA activation and breathe out on activation 5x10,  hip abduction with red heavy loop x10 Sidelying:  Foam roll forward and back for hip protrusion/retraction 10x right/eft Open books right/left 10x  Clam  10x right/left Seated:   Hamstring  stretch 3x20 seconds   Seated on ball: pelvic rocking: side to side, anterior/poserior and circles bil  Manual: KT  H  pattern over lumbo sacral region (pt requests it to be tighter) skin has done fine so far  09/13/24: Supine Propped on wedge:   Lumbar rotation discomfort in right low back Piriformis: tender with achieving position  ball squeeze with TA activation and breathe out on activation 5x10,  hip abduction with red heavy loop x10 Sidelying:  Flexion/rotation lumbar stretch right side Foam roll forward and back for hip protrusion/retraction 10x right/eft Open books right/left 10x  Clam  10x right/left Quadruped: cat/cow, rocking forward and back hip shifting 8-10x right/left  KT  H  pattern over lumbo sacral region (pt requests it to be tighter) skin has done fine so far  09/06/24: Propped on wedge:   ball squeeze with TA activation and breathe out on activation 5x10,  hip abduction with pink heavy loop x10 Sidelying:  reverse clam with ball between knees 10x,  UE push down on ball with coordinated exhale 10x,  hip abduction 10x right/left Quadruped with yoga block under knee: cat/cow, rocking forward and back hip shifting 8-10x right/left  KT  H  pattern over lumbo sacral region: advised on removal in 3 days to check skin tolerance       PATIENT EDUCATION:  Education details: Educated patient on anatomy and physiology of current symptoms, prognosis, plan of care as well as initial self care strategies to promote recovery Person educated: Patient Education method: Explanation Education comprehension: verbalized understanding  HOME EXERCISE PROGRAM: [id number]  ASSESSMENT:  CLINICAL IMPRESSION: Patient is able to find some relief with pelvic mobility ex's particularly in seated and  sidelying positions. She reports reduction in back pain at the end of session.  She continues to report good relief with KT tape.  Therapist closely monitoring response and ensuring  intensity remains low secondary to ongoing high blood pressure.    OBJECTIVE IMPAIRMENTS: decreased activity tolerance, decreased mobility, difficulty walking, decreased strength, impaired perceived functional ability, and pain.   ACTIVITY LIMITATIONS: carrying, lifting, bending, standing, squatting, locomotion level, and caring for others  PARTICIPATION LIMITATIONS: meal prep, cleaning, laundry, interpersonal relationship, and occupation  PERSONAL FACTORS: Time since onset of injury/illness/exacerbation and 3+ comorbidities: HTN during pregnancy, asthma, anxiety are also affecting patient's functional outcome.   REHAB POTENTIAL: Good  CLINICAL DECISION MAKING: Evolving/moderate complexity  EVALUATION COMPLEXITY: Moderate   GOALS: Goals reviewed with patient? Yes  SHORT TERM GOALS: Target date: 09/27/2024    The patient will demonstrate knowledge of basic self care strategies and exercises  Baseline:09/20/24 Goal status: MET   2.  The patient will have trunk and LE strength and mobility needed to bend forward needed to take care of her young children Baseline:  Goal status: met 12/16 3.  The patient will report a 30% improvement in pain levels with functional activities which are currently difficult including bending and turning Baseline: 60% better  Goal status: 12/16    LONG TERM GOALS: Target date: 12/06/2024    The patient will be independent in a safe self progression of a home exercise program to promote further recovery of function  Baseline:  Goal status: INITIAL  2.  The patient will report a 60% improvement in pain levels with functional activities which are currently difficult including bending and turning Baseline: 60% with standing (09/20/24) Goal status: In progress   3.  The patient will have improved hip strength to at least 4+/5 needed for standing, walking longer distances  Baseline:  Goal status: INITIAL  4.  The patient will have improved trunk  flexor and extensor muscle strength needed for lifting her newborn  Baseline:  Goal status: INITIAL  5.  Modified Oswestry functional outcome measure score improved to   26  % indicating improved function with ADLS with less pain.   Baseline:  Goal status: INITIAL  6. Pt will improve UIQ-7 score to less than 50 in order to demonstrate improved activity tolerance and less impact of bladder control on functional activities.   Baseline: 81  Goal Status: INITIAL  7. Pt will demonstrate normal pelvic floor muscle tone and A/ROM, able to achieve 3/5 strength with contractions and 10 sec endurance, in order to reduce urinary leaking and number of pads patient wears.   Baseline:  Goal Status: INITIAL  8. Pt will reduce pad use to no more than 1 a day in order to decrease monetary impact and risk of infection.   Baseline: 3 a day  Goal status: INITIAL  9. Pt will be able to go 2-3 hours in between voids without urgency or incontinence in order to improve QOL and perform all functional activities with less difficulty.   Baseline: 3-4x/hour  Goal Status: INITIAL  10. Pt will decrease frequency of nightly trips to the bathroom to 1 or less in order to get restful sleep.   Baseline: every 2 hours  Goal status: INITIAL  PLAN:  PT FREQUENCY: 1-2x/week  PT DURATION: 16 weeks    PLANNED INTERVENTIONS: 97164- PT Re-evaluation, 97750- Physical Performance Testing, 97110-Therapeutic exercises, 97530- Therapeutic activity, [id number]- Neuromuscular re-education, 97535- Self Care, [id number]- Manual therapy, [phone]- Aquatic Therapy, Patient/Family education, Taping,  Joint mobilization, Cryotherapy, and Moist heat.  PLAN FOR NEXT SESSION:  KT tape; pelvic mobility ex's;  seated ball ex's; low level core; monitor BP and symptom response  Pelvic floor: splinting techniques to help with constipation; revisit what midwife says about fiber/magnesium; core training with use of small and medium stability ball,  quadruped positions; go over pelvic stability belt for support and pain reduction   Glade Pesa, PT 09/27/2024 2:03 PM Phone: [phone] Fax: [phone]  [facility] [address], [address] [place], [place] [address] Phone # [phone] Fax [phone]      "

## 2024-09-27 NOTE — Telephone Encounter (Signed)
 Romero from [employer] left message on nurse line requesting to speak with Leftwich-Kirby, CNM or Dr. Izell for a Peer to Peer about this patient's short term disability.  She identified this pt by name and DOB.  Call back number is [phone] Reference # [id number]

## 2024-09-28 ENCOUNTER — Encounter (HOSPITAL_COMMUNITY)
Admission: RE | Admit: 2024-09-28 | Discharge: 2024-09-28 | Disposition: A | Discharge status: Home / Self Care | Source: Ambulatory Visit | Attending: Student | Admitting: Student

## 2024-09-28 VITALS — BP 138/88 | HR 109 | Temp 98.0°F | Resp 16

## 2024-09-28 DIAGNOSIS — O99012 Anemia complicating pregnancy, second trimester: Secondary | ICD-10-CM

## 2024-09-28 DIAGNOSIS — D509 Iron deficiency anemia, unspecified: Secondary | ICD-10-CM | POA: Diagnosis present

## 2024-09-28 DIAGNOSIS — Z3A28 28 weeks gestation of pregnancy: Secondary | ICD-10-CM | POA: Diagnosis present

## 2024-09-28 MED ORDER — IRON SUCROSE 200 MG IVPB - SIMPLE MED
Status: AC
  Filled 2024-09-28: qty 110

## 2024-09-28 MED ORDER — IRON SUCROSE 200 MG IVPB - SIMPLE MED
200.0000 mg | Freq: Once | Status: AC
  Administered 2024-09-28: 200 mg via INTRAVENOUS

## 2024-09-30 ENCOUNTER — Ambulatory Visit (HOSPITAL_COMMUNITY)
Admission: RE | Admit: 2024-09-30 | Discharge: 2024-09-30 | Disposition: A | Discharge status: Home / Self Care | Source: Ambulatory Visit | Attending: Student | Admitting: Student

## 2024-09-30 ENCOUNTER — Encounter: Payer: Self-pay | Admitting: *Deleted

## 2024-09-30 VITALS — BP 151/95 | HR 118 | Temp 97.8°F | Resp 17

## 2024-09-30 DIAGNOSIS — D509 Iron deficiency anemia, unspecified: Secondary | ICD-10-CM | POA: Diagnosis not present

## 2024-09-30 DIAGNOSIS — O99013 Anemia complicating pregnancy, third trimester: Secondary | ICD-10-CM | POA: Diagnosis not present

## 2024-09-30 DIAGNOSIS — O99012 Anemia complicating pregnancy, second trimester: Secondary | ICD-10-CM | POA: Diagnosis present

## 2024-09-30 DIAGNOSIS — Z3A29 29 weeks gestation of pregnancy: Secondary | ICD-10-CM | POA: Insufficient documentation

## 2024-09-30 MED ORDER — IRON SUCROSE 200 MG IVPB - SIMPLE MED
Status: AC
  Filled 2024-09-30: qty 110

## 2024-09-30 MED ORDER — IRON SUCROSE 200 MG IVPB - SIMPLE MED
200.0000 mg | Freq: Once | Status: AC
  Administered 2024-09-30: 200 mg via INTRAVENOUS

## 2024-10-02 ENCOUNTER — Encounter: Payer: Self-pay | Admitting: Family Medicine

## 2024-10-03 ENCOUNTER — Inpatient Hospital Stay (HOSPITAL_COMMUNITY): Admission: RE | Admit: 2024-10-03 | Source: Ambulatory Visit

## 2024-10-03 VITALS — BP 143/90 | HR 108 | Temp 98.2°F | Resp 16

## 2024-10-03 DIAGNOSIS — O99013 Anemia complicating pregnancy, third trimester: Secondary | ICD-10-CM | POA: Diagnosis not present

## 2024-10-03 DIAGNOSIS — O99012 Anemia complicating pregnancy, second trimester: Secondary | ICD-10-CM

## 2024-10-03 DIAGNOSIS — D509 Iron deficiency anemia, unspecified: Secondary | ICD-10-CM

## 2024-10-03 MED ORDER — IRON SUCROSE 200 MG IVPB - SIMPLE MED
200.0000 mg | Freq: Once | Status: AC
  Administered 2024-10-03: 200 mg via INTRAVENOUS

## 2024-10-03 MED ORDER — IRON SUCROSE 200 MG IVPB - SIMPLE MED
Status: AC
  Filled 2024-10-03: qty 110

## 2024-10-04 ENCOUNTER — Encounter: Payer: Self-pay | Admitting: *Deleted

## 2024-10-04 ENCOUNTER — Ambulatory Visit: Attending: Maternal & Fetal Medicine

## 2024-10-04 ENCOUNTER — Ambulatory Visit: Admitting: Physical Therapy

## 2024-10-04 ENCOUNTER — Other Ambulatory Visit

## 2024-10-04 VITALS — BP 138/85

## 2024-10-04 DIAGNOSIS — Z148 Genetic carrier of other disease: Secondary | ICD-10-CM | POA: Insufficient documentation

## 2024-10-04 DIAGNOSIS — O10013 Pre-existing essential hypertension complicating pregnancy, third trimester: Secondary | ICD-10-CM

## 2024-10-04 DIAGNOSIS — D563 Thalassemia minor: Secondary | ICD-10-CM

## 2024-10-04 DIAGNOSIS — O0992 Supervision of high risk pregnancy, unspecified, second trimester: Secondary | ICD-10-CM

## 2024-10-04 DIAGNOSIS — O0993 Supervision of high risk pregnancy, unspecified, third trimester: Secondary | ICD-10-CM | POA: Diagnosis not present

## 2024-10-04 DIAGNOSIS — O358XX0 Maternal care for other (suspected) fetal abnormality and damage, not applicable or unspecified: Secondary | ICD-10-CM

## 2024-10-04 DIAGNOSIS — Z362 Encounter for other antenatal screening follow-up: Secondary | ICD-10-CM | POA: Diagnosis not present

## 2024-10-04 DIAGNOSIS — Z3A25 25 weeks gestation of pregnancy: Secondary | ICD-10-CM

## 2024-10-04 DIAGNOSIS — O10913 Unspecified pre-existing hypertension complicating pregnancy, third trimester: Secondary | ICD-10-CM

## 2024-10-04 DIAGNOSIS — O99012 Anemia complicating pregnancy, second trimester: Secondary | ICD-10-CM

## 2024-10-04 DIAGNOSIS — O99013 Anemia complicating pregnancy, third trimester: Secondary | ICD-10-CM | POA: Insufficient documentation

## 2024-10-04 DIAGNOSIS — Z3A29 29 weeks gestation of pregnancy: Secondary | ICD-10-CM | POA: Diagnosis not present

## 2024-10-04 NOTE — Progress Notes (Signed)
 MFM Consult Note  Laura [name] is currently at [redacted]w[redacted]d. She has been followed due to chronic hypertension treated with labetalol 400 mg 3 times a day.  Her pregnancy has also been complicated by the presence of nonspecific antibodies that were found earlier in her pregnancy.  Her most recent antibody screen drawn 2 weeks ago was negative.    She denies any problems since her last exam.  Her blood pressure today was 138/85.  Sonographic findings Single intrauterine pregnancy at 29w 1d.  Fetal cardiac activity:  Observed and appears normal. Presentation: Cephalic. Fetal biometry shows the estimated fetal weight of 3 lb 3 oz,  1432g (56%). Amniotic fluid volume: Within normal limits. AFI: 18.83cm.  MVP: 6.81 cm. Placenta: Posterior Left.  The peak systolic velocity of the middle cerebral artery was less than 1.5 multiple of the median for her gestational age, indicating that her fetus is not anemic at this time.   There were no signs of fetal hydrops noted on today's exam. The patient was reassured by today's findings.  Due to chronic hypertension, we will continue to follow her with growth ultrasounds throughout her pregnancy.    Weekly fetal testing will be started at 32 weeks and continued until delivery.  She should continue taking 2 tablets of baby aspirin  daily for preeclampsia prophylaxis.    She will return in 3 weeks for a BPP.  The patient stated that all of her questions were answered.   A total of 20 minutes was spent counseling and coordinating the care for this patient.  Greater than 50% of the time was spent in direct face-to-face contact.

## 2024-10-05 ENCOUNTER — Encounter (HOSPITAL_COMMUNITY)
Admission: RE | Admit: 2024-10-05 | Discharge: 2024-10-05 | Disposition: A | Discharge status: Home / Self Care | Source: Ambulatory Visit | Attending: Student | Admitting: Student

## 2024-10-05 VITALS — BP 129/82 | HR 110 | Temp 98.0°F | Resp 16

## 2024-10-05 DIAGNOSIS — O99012 Anemia complicating pregnancy, second trimester: Secondary | ICD-10-CM | POA: Insufficient documentation

## 2024-10-05 DIAGNOSIS — D509 Iron deficiency anemia, unspecified: Secondary | ICD-10-CM | POA: Diagnosis present

## 2024-10-05 MED ORDER — IRON SUCROSE 200 MG IVPB - SIMPLE MED
200.0000 mg | Freq: Once | Status: AC
  Administered 2024-10-05: 200 mg via INTRAVENOUS

## 2024-10-05 MED ORDER — IRON SUCROSE 200 MG IVPB - SIMPLE MED
Status: AC
  Filled 2024-10-05: qty 110

## 2024-10-06 ENCOUNTER — Ambulatory Visit: Payer: Self-pay

## 2024-10-06 ENCOUNTER — Telehealth: Payer: Self-pay | Admitting: Family Medicine

## 2024-10-06 NOTE — Telephone Encounter (Signed)
 Called patient to inform her that she missed her appointment on 10/06/2024. Patient did not answer so I left a detailed message asking to give us  a call back to reschedule.

## 2024-10-08 ENCOUNTER — Encounter: Payer: Self-pay | Admitting: Advanced Practice Midwife

## 2024-10-09 ENCOUNTER — Encounter: Payer: Self-pay | Admitting: Obstetrics and Gynecology

## 2024-10-11 ENCOUNTER — Ambulatory Visit: Admitting: Physical Therapy

## 2024-10-11 ENCOUNTER — Ambulatory Visit (INDEPENDENT_AMBULATORY_CARE_PROVIDER_SITE_OTHER): Payer: Self-pay | Admitting: Advanced Practice Midwife

## 2024-10-11 DIAGNOSIS — O26893 Other specified pregnancy related conditions, third trimester: Secondary | ICD-10-CM | POA: Diagnosis not present

## 2024-10-11 DIAGNOSIS — O0993 Supervision of high risk pregnancy, unspecified, third trimester: Secondary | ICD-10-CM | POA: Diagnosis not present

## 2024-10-11 DIAGNOSIS — Z3A30 30 weeks gestation of pregnancy: Secondary | ICD-10-CM | POA: Diagnosis not present

## 2024-10-11 DIAGNOSIS — O0992 Supervision of high risk pregnancy, unspecified, second trimester: Secondary | ICD-10-CM

## 2024-10-11 DIAGNOSIS — O99013 Anemia complicating pregnancy, third trimester: Secondary | ICD-10-CM

## 2024-10-11 DIAGNOSIS — O10913 Unspecified pre-existing hypertension complicating pregnancy, third trimester: Secondary | ICD-10-CM

## 2024-10-11 DIAGNOSIS — R519 Headache, unspecified: Secondary | ICD-10-CM | POA: Diagnosis not present

## 2024-10-11 DIAGNOSIS — O99012 Anemia complicating pregnancy, second trimester: Secondary | ICD-10-CM

## 2024-10-11 DIAGNOSIS — O10919 Unspecified pre-existing hypertension complicating pregnancy, unspecified trimester: Secondary | ICD-10-CM

## 2024-10-11 MED ORDER — NIFEDIPINE ER OSMOTIC RELEASE 30 MG PO TB24: 30.0000 mg | ORAL_TABLET | Freq: Every day | ORAL | 5 refills | Status: AC

## 2024-10-11 NOTE — Progress Notes (Addendum)
 "       PRENATAL VISIT NOTE- Centering Pregnancy Cycle 24, Session # 5  Subjective:  Laura [name] is a 24 y.o. [phone] at [redacted]w[redacted]d being seen today for ongoing prenatal care through [facility] Pregnancy.  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  Patient reports occasional contractions and decreased fetal movement.  Contractions: Irritability. Vag. Bleeding: None.  Movement: (!) Decreased. Denies leaking of fluid/ROM.   The following portions of the patient's history were reviewed and updated as appropriate: allergies, current medications, past family history, past medical history, past social history, past surgical history and problem list. Problem list updated.  Objective:  There were no vitals filed for this visit.  Fetal Status: Fetal Heart Rate (bpm): 156 Fundal Height: 30 cm Movement: (!) Decreased     General:  Alert, oriented and cooperative. Patient is in no acute distress.  Skin: Skin is warm and dry. No rash noted.   Cardiovascular: Normal heart rate noted  Respiratory: Normal respiratory effort, no problems with respiration noted  Abdomen: Soft, gravid, appropriate for gestational age.  Pain/Pressure: Present     Pelvic: Cervical exam deferred        Extremities: Normal range of motion.  Edema: None  Mental Status: Normal mood and affect. Normal behavior. Normal judgment and thought content.   Assessment  and Plan:  Pregnancy: [id number] at [redacted]w[redacted]d  1. Chronic hypertension affecting pregnancy (Primary) --BP elevated 140s/90s-100s, similar today in Centering to pt home cuff.  Add daily Procardia . Initially changed from Procardia  to Labetalol due to h/a. Pt h/a now improved as anemia has improved.  Continue Labetelol 400 mg TID.   --Pt reported feeling foggy headed over the weekend and messaged the office. She was told to go to [facility] but she did not go. She denies any neurological symptoms today. Neuro exam wnl.   - NIFEdipine  (PROCARDIA  XL) 30 MG 24 hr tablet; Take 1 tablet (30 mg total) by mouth daily.  Dispense: 30 tablet; Refill: 5  2. Supervision of high risk pregnancy, antepartum, second trimester --Anticipatory guidance about next visits/weeks of pregnancy given.  ester (Primary)   Centering Pregnancy, Session#5: Reviewed resources in [employer].   Facilitated discussion today:  Breastfeeding, pumping, returning to work, saving colostrum, with guest facilitator [employer], LC.   Fundal height and FHR appropriate today unless noted otherwise in plan. Patient to continue group care.    3. Headache in pregnancy, antepartum, third trimester --Improved s/p iron  infusions, will restart Procardia  given elevated BP on Labetalol, see above.    4. Anemia affecting pregnancy in second trimester   5. [redacted] weeks gestation of pregnancy      Preterm labor symptoms and general obstetric precautions including but not limited to vaginal bleeding, contractions, leaking of fluid and fetal movement were reviewed in detail with the patient. Please refer to After Visit Summary for other counseling recommendations.  No follow-ups on file.  Future Appointments  Date Time Provider Department Center  10/11/2024 11:45 AM Antonetta Glade BROCKS, PT [facility] None  10/25/2024  9:00 AM CENTERING PROVIDER [facility]  10/25/2024 11:15 AM [facility] PROVIDER 1 [facility]  10/25/2024 11:30 AM [facility] US5 [facility]  [facility]  11/03/2024 11:15 AM [facility] PROVIDER 1 [facility]  11/03/2024 11:30 AM [facility] US2 [facility]  11/08/2024  9:00 AM CENTERING PROVIDER [facility]  11/09/2024  8:45 AM [facility] NST [facility]  11/16/2024  8:45 AM [facility] NST [facility]  11/22/2024  9:00 AM CENTERING PROVIDER [facility]  11/23/2024  8:45 AM [facility] NST [facility]  11/29/2024  9:00 AM CENTERING PROVIDER [facility]  11/29/2024 11:15 AM [facility] PROVIDER 1 [facility]  11/29/2024 11:30 AM [facility] US1 [facility]  12/06/2024  9:00 AM CENTERING PROVIDER [facility]    Olam [employer], CNM  "

## 2024-10-12 ENCOUNTER — Ambulatory Visit: Payer: Self-pay | Admitting: Advanced Practice Midwife

## 2024-10-12 LAB — CBC
Hematocrit: 30.8 % — ABNORMAL LOW (ref 34.0–46.6)
Hemoglobin: 8.9 g/dL — ABNORMAL LOW (ref 11.1–15.9)
MCH: 21.9 pg — ABNORMAL LOW (ref 26.6–33.0)
MCHC: 28.9 g/dL — ABNORMAL LOW (ref 31.5–35.7)
MCV: 76 fL — ABNORMAL LOW (ref 79–97)
NRBC: 2 % — ABNORMAL HIGH (ref 0–0)
Platelets: 174 x10E3/uL (ref 150–450)
RBC: 4.07 x10E6/uL (ref 3.77–5.28)
RDW: 22.6 % — ABNORMAL HIGH (ref 11.7–15.4)
WBC: 6.7 x10E3/uL (ref 3.4–10.8)

## 2024-10-12 LAB — PROTEIN / CREATININE RATIO, URINE
Creatinine, Urine: 306.8 mg/dL
Protein, Ur: 50.9 mg/dL
Protein/Creat Ratio: 166 mg/g{creat} (ref 0–200)

## 2024-10-12 LAB — COMPREHENSIVE METABOLIC PANEL WITH GFR
ALT: 11 IU/L (ref 0–32)
AST: 31 IU/L (ref 0–40)
Albumin: 3.4 g/dL — ABNORMAL LOW (ref 4.0–5.0)
Alkaline Phosphatase: 84 IU/L (ref 41–116)
BUN/Creatinine Ratio: 12 (ref 9–23)
BUN: 8 mg/dL (ref 6–20)
Bilirubin Total: 0.6 mg/dL (ref 0.0–1.2)
CO2: 19 mmol/L — ABNORMAL LOW (ref 20–29)
Calcium: 8.9 mg/dL (ref 8.7–10.2)
Chloride: 104 mmol/L (ref 96–106)
Creatinine, Ser: 0.68 mg/dL (ref 0.57–1.00)
Globulin, Total: 2.8 g/dL (ref 1.5–4.5)
Glucose: 64 mg/dL — ABNORMAL LOW (ref 70–99)
Potassium: 3.8 mmol/L (ref 3.5–5.2)
Sodium: 137 mmol/L (ref 134–144)
Total Protein: 6.2 g/dL (ref 6.0–8.5)
eGFR: 124 mL/min/1.73

## 2024-10-21 ENCOUNTER — Ambulatory Visit: Attending: Obstetrics and Gynecology

## 2024-10-21 VITALS — BP 137/85 | HR 106

## 2024-10-21 DIAGNOSIS — O10919 Unspecified pre-existing hypertension complicating pregnancy, unspecified trimester: Secondary | ICD-10-CM

## 2024-10-21 DIAGNOSIS — O10013 Pre-existing essential hypertension complicating pregnancy, third trimester: Secondary | ICD-10-CM

## 2024-10-21 DIAGNOSIS — O10913 Unspecified pre-existing hypertension complicating pregnancy, third trimester: Secondary | ICD-10-CM | POA: Diagnosis present

## 2024-10-21 DIAGNOSIS — Z3A31 31 weeks gestation of pregnancy: Secondary | ICD-10-CM | POA: Diagnosis not present

## 2024-10-21 NOTE — Procedures (Unsigned)
 Laura [name] 16-Nov-2000 [redacted]w[redacted]d  Fetus A Non-Stress Test Interpretation for 10/21/24  Indication: Chronic Hypertension - NST only  Fetal Heart Rate A Mode: External Baseline Rate (A): 145 bpm Variability: Moderate Accelerations: 15 x 15, 10 x 10 Decelerations: None Multiple birth?: No  Uterine Activity Mode: Palpation, Toco Contraction Frequency (min): none noted Resting Tone Palpated: Relaxed  Interpretation (Fetal Testing) Nonstress Test Interpretation: Reactive Comments: Reviewed with Dr. Ileana  A modified BPP performed today showed normal amniotic fluid with a total AFI of 15.16 cm.    The fetus was in the vertex presentation.

## 2024-10-25 ENCOUNTER — Telehealth: Payer: Self-pay

## 2024-10-25 ENCOUNTER — Ambulatory Visit

## 2024-10-25 ENCOUNTER — Other Ambulatory Visit: Payer: Self-pay | Admitting: *Deleted

## 2024-10-25 DIAGNOSIS — Z3A32 32 weeks gestation of pregnancy: Secondary | ICD-10-CM

## 2024-10-25 DIAGNOSIS — O0992 Supervision of high risk pregnancy, unspecified, second trimester: Secondary | ICD-10-CM

## 2024-10-25 DIAGNOSIS — O10919 Unspecified pre-existing hypertension complicating pregnancy, unspecified trimester: Secondary | ICD-10-CM

## 2024-10-25 NOTE — Progress Notes (Signed)
 I sent text message via MyChart with virtual appointment link with no answer.  Message sent to front desk to reschedule visit for this week or next.  Centering visit as scheduled in 2 weeks.

## 2024-10-25 NOTE — Progress Notes (Signed)
 Attempted to contact pt x 3 and received recording stating to try call again later.  MyChart message sent.    Nanna Ertle, RN  10/25/24

## 2024-10-28 ENCOUNTER — Encounter: Payer: Self-pay | Admitting: Obstetrics & Gynecology

## 2024-10-28 ENCOUNTER — Ambulatory Visit: Attending: Obstetrics and Gynecology | Admitting: *Deleted

## 2024-10-28 DIAGNOSIS — I10 Essential (primary) hypertension: Secondary | ICD-10-CM | POA: Diagnosis present

## 2024-10-28 DIAGNOSIS — Z3A32 32 weeks gestation of pregnancy: Secondary | ICD-10-CM | POA: Diagnosis not present

## 2024-10-28 DIAGNOSIS — O10013 Pre-existing essential hypertension complicating pregnancy, third trimester: Secondary | ICD-10-CM

## 2024-10-28 DIAGNOSIS — O10913 Unspecified pre-existing hypertension complicating pregnancy, third trimester: Secondary | ICD-10-CM

## 2024-10-28 NOTE — Procedures (Signed)
 Laura [name] May 15, 2001 [redacted]w[redacted]d  Fetus A Non-Stress Test Interpretation for 10/28/24- NST only  Indication: Chronic Hypertension  Fetal Heart Rate A Mode: External Baseline Rate (A): 140 bpm Variability: Moderate Accelerations: 15 x 15 Decelerations: None Multiple birth?: No  Uterine Activity Mode: Toco Contraction Frequency (min): none Resting Tone Palpated: Relaxed  Interpretation (Fetal Testing) Nonstress Test Interpretation: Reactive Comments: Tracing reviewed by Dr. Ileana

## 2024-10-31 ENCOUNTER — Encounter: Payer: Self-pay | Admitting: Obstetrics & Gynecology

## 2024-11-01 ENCOUNTER — Telehealth: Payer: Self-pay | Admitting: Family Medicine

## 2024-11-01 NOTE — Telephone Encounter (Signed)
 Called patient twice to reschedule her OB visit after it was cancelled due to the weather. Patient did not answer so I left a detailed message with our call back number so that she can call to reschedule.

## 2024-11-03 ENCOUNTER — Ambulatory Visit

## 2024-11-03 ENCOUNTER — Other Ambulatory Visit

## 2024-11-03 ENCOUNTER — Encounter: Payer: Self-pay | Admitting: Family Medicine

## 2024-11-03 ENCOUNTER — Ambulatory Visit (HOSPITAL_BASED_OUTPATIENT_CLINIC_OR_DEPARTMENT_OTHER)

## 2024-11-03 VITALS — BP 129/84 | HR 86

## 2024-11-03 DIAGNOSIS — O0992 Supervision of high risk pregnancy, unspecified, second trimester: Secondary | ICD-10-CM

## 2024-11-03 DIAGNOSIS — O10913 Unspecified pre-existing hypertension complicating pregnancy, third trimester: Secondary | ICD-10-CM

## 2024-11-03 DIAGNOSIS — O10013 Pre-existing essential hypertension complicating pregnancy, third trimester: Secondary | ICD-10-CM

## 2024-11-03 DIAGNOSIS — D563 Thalassemia minor: Secondary | ICD-10-CM

## 2024-11-03 DIAGNOSIS — Z3A33 33 weeks gestation of pregnancy: Secondary | ICD-10-CM | POA: Diagnosis not present

## 2024-11-03 DIAGNOSIS — O99012 Anemia complicating pregnancy, second trimester: Secondary | ICD-10-CM

## 2024-11-03 DIAGNOSIS — O10919 Unspecified pre-existing hypertension complicating pregnancy, unspecified trimester: Secondary | ICD-10-CM

## 2024-11-03 DIAGNOSIS — O99013 Anemia complicating pregnancy, third trimester: Secondary | ICD-10-CM

## 2024-11-03 DIAGNOSIS — O358XX0 Maternal care for other (suspected) fetal abnormality and damage, not applicable or unspecified: Secondary | ICD-10-CM

## 2024-11-03 NOTE — Progress Notes (Signed)
 MFM Consult Note  Laura [name] is currently at [redacted]w[redacted]d. She has been followed due to chronic hypertension that is treated with labetalol.  She denies any problems since her last exam and reports feeling fetal movements throughout the day.    Her blood pressure today was 129/84.  Sonographic findings Single intrauterine pregnancy at 33w 3d. Fetal cardiac activity: Observed. Presentation: Cephalic. Fetal biometry shows the estimated fetal weight of 5 lb 3 oz,  2350g (63%). Amniotic fluid: Within normal limits. AFI: 14.01 cm.  MVP: 4.34 cm. Placenta: Posterior Left. BPP: 8/8.   Due to chronic hypertension, she should continue weekly fetal testing until delivery.    She already has weekly NSTs scheduled for the next 3 weeks in your office.    She will return to our office in 4 weeks for another growth scan and BPP.  Should her blood pressures remain within normal limits and she does not develop preeclampsia, due to chronic hypertension, delivery is recommended at around 39 weeks.  The patient stated that all of her questions were answered.   A total of 20 minutes was spent counseling and coordinating the care for this patient.  Greater than 50% of the time was spent in direct face-to-face contact.

## 2024-11-09 ENCOUNTER — Ambulatory Visit

## 2024-11-16 ENCOUNTER — Ambulatory Visit

## 2024-11-23 ENCOUNTER — Ambulatory Visit

## 2024-11-29 ENCOUNTER — Ambulatory Visit
